# Patient Record
Sex: Female | Born: 1990
Health system: Southern US, Community
[De-identification: ages and names within clinical notes are randomized; demographics above are authoritative.]

## PROBLEM LIST (undated history)

## (undated) ENCOUNTER — Inpatient Hospital Stay (HOSPITAL_COMMUNITY): Payer: Self-pay

## (undated) DIAGNOSIS — J302 Other seasonal allergic rhinitis: Secondary | ICD-10-CM

## (undated) DIAGNOSIS — D649 Anemia, unspecified: Secondary | ICD-10-CM

## (undated) DIAGNOSIS — B999 Unspecified infectious disease: Secondary | ICD-10-CM

## (undated) DIAGNOSIS — O24419 Gestational diabetes mellitus in pregnancy, unspecified control: Secondary | ICD-10-CM

## (undated) HISTORY — DX: Gestational diabetes mellitus in pregnancy, unspecified control: O24.419

## (undated) HISTORY — PX: NO PAST SURGERIES: SHX2092

---
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
## 2008-03-20 ENCOUNTER — Ambulatory Visit: Payer: Self-pay | Admitting: Family Medicine

## 2008-03-20 ENCOUNTER — Encounter (INDEPENDENT_AMBULATORY_CARE_PROVIDER_SITE_OTHER): Payer: Self-pay | Admitting: Family Medicine

## 2008-03-20 LAB — CONVERTED CEMR LAB
Bilirubin Urine: NEGATIVE
Glucose, Urine, Semiquant: NEGATIVE
Protein, U semiquant: NEGATIVE
Specific Gravity, Urine: 1.025
pH: 5.5

## 2008-03-22 ENCOUNTER — Telehealth (INDEPENDENT_AMBULATORY_CARE_PROVIDER_SITE_OTHER): Payer: Self-pay | Admitting: *Deleted

## 2008-03-22 ENCOUNTER — Ambulatory Visit: Payer: Self-pay | Admitting: Family Medicine

## 2008-03-22 LAB — CONVERTED CEMR LAB: GC Probe Amp, Genital: NEGATIVE

## 2008-04-14 ENCOUNTER — Emergency Department (HOSPITAL_COMMUNITY): Admission: EM | Admit: 2008-04-14 | Discharge: 2008-04-14 | Payer: Self-pay | Admitting: Emergency Medicine

## 2008-07-09 ENCOUNTER — Ambulatory Visit: Payer: Self-pay | Admitting: Family Medicine

## 2008-07-09 LAB — CONVERTED CEMR LAB
Bacteria, UA: 3
Bilirubin Urine: NEGATIVE
Glucose, Urine, Semiquant: NEGATIVE
Ketones, urine, test strip: NEGATIVE
Specific Gravity, Urine: 1.02
pH: 8

## 2008-10-01 ENCOUNTER — Encounter: Payer: Self-pay | Admitting: Family Medicine

## 2008-10-01 ENCOUNTER — Ambulatory Visit: Payer: Self-pay | Admitting: Family Medicine

## 2008-10-01 LAB — CONVERTED CEMR LAB: Whiff Test: POSITIVE

## 2008-10-02 ENCOUNTER — Encounter: Payer: Self-pay | Admitting: Family Medicine

## 2008-10-05 ENCOUNTER — Telehealth: Payer: Self-pay | Admitting: *Deleted

## 2008-10-26 ENCOUNTER — Ambulatory Visit: Payer: Self-pay | Admitting: Family Medicine

## 2008-12-28 ENCOUNTER — Ambulatory Visit: Payer: Self-pay | Admitting: Family Medicine

## 2009-02-12 ENCOUNTER — Ambulatory Visit: Payer: Self-pay | Admitting: Family Medicine

## 2009-03-15 ENCOUNTER — Ambulatory Visit: Payer: Self-pay | Admitting: Family Medicine

## 2010-05-13 ENCOUNTER — Encounter: Payer: Self-pay | Admitting: Family Medicine

## 2010-08-12 NOTE — Miscellaneous (Signed)
  Clinical Lists Changes  Problems: Removed problem of FOLLICULITIS (ICD-704.8) Removed problem of MOLLUSCUM CONTAGIOSUM (ICD-078.0) Removed problem of BACK PAIN, THORACIC REGION (ICD-724.1) Removed problem of HEARTBURN (ICD-787.1) Removed problem of NEED PROPH VACCINATION&INOCULAT OTH VIRAL DZ (ICD-V04.89) Removed problem of CONTACT OR EXPOSURE TO OTHER VIRAL DISEASES (ICD-V01.79) Removed problem of DYSURIA (ICD-788.1) Removed problem of CHLAMYDTRACHOMATIS INFECTION LOWER GU SITES (ICD-099.53) Removed problem of VAGINAL DISCHARGE (ICD-623.5) Removed problem of URINARY FREQUENCY (ICD-788.41) Removed problem of WELL CHILD EXAMINATION (ICD-V20.2)

## 2010-08-20 ENCOUNTER — Encounter: Payer: Self-pay | Admitting: *Deleted

## 2011-04-14 LAB — POCT RAPID STREP A: Streptococcus, Group A Screen (Direct): NEGATIVE

## 2011-04-30 ENCOUNTER — Inpatient Hospital Stay (INDEPENDENT_AMBULATORY_CARE_PROVIDER_SITE_OTHER)
Admission: RE | Admit: 2011-04-30 | Discharge: 2011-04-30 | Disposition: A | Discharge status: Home / Self Care | Payer: Self-pay | Source: Ambulatory Visit | Attending: Emergency Medicine | Admitting: Emergency Medicine

## 2011-04-30 DIAGNOSIS — J069 Acute upper respiratory infection, unspecified: Secondary | ICD-10-CM

## 2011-04-30 DIAGNOSIS — J3489 Other specified disorders of nose and nasal sinuses: Secondary | ICD-10-CM

## 2011-04-30 DIAGNOSIS — M549 Dorsalgia, unspecified: Secondary | ICD-10-CM

## 2011-08-13 ENCOUNTER — Encounter (HOSPITAL_COMMUNITY): Payer: Self-pay

## 2011-08-13 ENCOUNTER — Emergency Department (INDEPENDENT_AMBULATORY_CARE_PROVIDER_SITE_OTHER)
Admission: EM | Admit: 2011-08-13 | Discharge: 2011-08-13 | Disposition: A | Discharge status: Home / Self Care | Payer: Self-pay | Source: Home / Self Care | Attending: Family Medicine | Admitting: Family Medicine

## 2011-08-13 DIAGNOSIS — H6690 Otitis media, unspecified, unspecified ear: Secondary | ICD-10-CM

## 2011-08-13 DIAGNOSIS — H6691 Otitis media, unspecified, right ear: Secondary | ICD-10-CM

## 2011-08-13 DIAGNOSIS — H60399 Other infective otitis externa, unspecified ear: Secondary | ICD-10-CM

## 2011-08-13 DIAGNOSIS — H6090 Unspecified otitis externa, unspecified ear: Secondary | ICD-10-CM

## 2011-08-13 HISTORY — DX: Other seasonal allergic rhinitis: J30.2

## 2011-08-13 MED ORDER — NEOMYCIN-POLYMYXIN-HC 3.5-10000-1 OT SUSP: 4.0000 [drp] | Freq: Three times a day (TID) | OTIC | Status: AC

## 2011-08-13 MED ORDER — AMOXICILLIN 500 MG PO CAPS: 500.0000 mg | ORAL_CAPSULE | Freq: Three times a day (TID) | ORAL | Status: AC

## 2011-08-13 NOTE — ED Provider Notes (Signed)
History     CSN: [id number]  Arrival date & time 08/13/11  9147   First MD Initiated Contact with Patient 08/13/11 [phone]      Chief Complaint  Patient presents with  . Otalgia    (Consider location/radiation/quality/duration/timing/severity/associated sxs/prior treatment) HPI Comments: Right ear pain. Onset 1.5 wks. Pain with motion. No discharge. No cold symptoms. No fever. Reports hearing slightly decreased. No treatment pta.   The history is provided by the patient.    Past Medical History  Diagnosis Date  . Seasonal allergies     History reviewed. No pertinent past surgical history.  History reviewed. No pertinent family history.  History  Substance Use Topics  . Smoking status: Never Smoker   . Smokeless tobacco: Not on file  . Alcohol Use: No    OB History    Grav Para Term Preterm Abortions TAB SAB Ect Mult Living                  Review of Systems  Constitutional: Negative.   HENT: Positive for hearing loss and ear pain. Negative for congestion, rhinorrhea, neck pain, neck stiffness and ear discharge.   Respiratory: Negative.   Cardiovascular: Negative.   Gastrointestinal: Negative.   Musculoskeletal: Negative.   Skin: Negative.     Allergies  Review of patient's allergies indicates no known allergies.  Home Medications   Current Outpatient Rx  Name Route Sig Dispense Refill  . AMOXICILLIN 500 MG PO CAPS Oral Take 1 capsule (500 mg total) by mouth 3 (three) times daily. 30 capsule 0  . FAMOTIDINE 40 MG PO TABS Oral Take 40 mg by mouth daily. For acid refux, take daily for 2 weeks then daily as needed     . MEDROXYPROGESTERONE ACETATE 400 MG/ML IM SUSP  NO INSTRUCTIONS GIVEN     . NEOMYCIN-POLYMYXIN-HC 3.5-10000-1 OT SUSP Right Ear Place 4 drops into the right ear 3 (three) times daily. 7.5 mL 0    BP 104/67  Pulse 83  Temp(Src) 98.8 F (37.1 C) (Oral)  Resp 18  SpO2 99%  Physical Exam  Nursing note and vitals reviewed. Constitutional:  She appears well-developed. No distress.  HENT:       Left tm clear, right tm red, pain with motion of the external ear. Right eac swollen and red  Neck: Normal range of motion. Neck supple.  Cardiovascular: Normal rate and regular rhythm.   Pulmonary/Chest: Effort normal and breath sounds normal.  Lymphadenopathy:    She has no cervical adenopathy.  Skin: Skin is warm and dry.    ED Course  Procedures (including critical care time)   Labs Reviewed  POCT PREGNANCY, URINE   No results found.   1. Otitis externa   2. Otitis media of right ear       MDM          Randa Spike, MD 08/13/11 1006

## 2011-08-13 NOTE — ED Notes (Signed)
C/o rt ear pain for 1 1/2 weeks, states ear feels stopped up as well

## 2011-08-21 ENCOUNTER — Ambulatory Visit (INDEPENDENT_AMBULATORY_CARE_PROVIDER_SITE_OTHER): Payer: Self-pay | Admitting: Family Medicine

## 2011-08-21 ENCOUNTER — Other Ambulatory Visit (HOSPITAL_COMMUNITY)
Admission: RE | Admit: 2011-08-21 | Discharge: 2011-08-21 | Disposition: A | Discharge status: Home / Self Care | Payer: Self-pay | Source: Ambulatory Visit | Attending: Family Medicine | Admitting: Family Medicine

## 2011-08-21 ENCOUNTER — Other Ambulatory Visit (HOSPITAL_COMMUNITY)
Admission: RE | Admit: 2011-08-21 | Discharge: 2011-08-21 | Disposition: A | Discharge status: Home / Self Care | Payer: Medicaid Other | Source: Ambulatory Visit | Attending: Family Medicine | Admitting: Family Medicine

## 2011-08-21 DIAGNOSIS — N760 Acute vaginitis: Secondary | ICD-10-CM

## 2011-08-21 DIAGNOSIS — Z113 Encounter for screening for infections with a predominantly sexual mode of transmission: Secondary | ICD-10-CM | POA: Insufficient documentation

## 2011-08-21 DIAGNOSIS — Z01419 Encounter for gynecological examination (general) (routine) without abnormal findings: Secondary | ICD-10-CM | POA: Insufficient documentation

## 2011-08-21 DIAGNOSIS — Z124 Encounter for screening for malignant neoplasm of cervix: Secondary | ICD-10-CM

## 2011-08-21 DIAGNOSIS — N912 Amenorrhea, unspecified: Secondary | ICD-10-CM

## 2011-08-21 DIAGNOSIS — N898 Other specified noninflammatory disorders of vagina: Secondary | ICD-10-CM

## 2011-08-21 LAB — POCT WET PREP (WET MOUNT)

## 2011-08-21 MED ORDER — FLUCONAZOLE 200 MG PO TABS: 200.0000 mg | ORAL_TABLET | Freq: Once | ORAL | Status: AC

## 2011-08-21 MED ORDER — METRONIDAZOLE 500 MG PO TABS: 500.0000 mg | ORAL_TABLET | Freq: Two times a day (BID) | ORAL | Status: AC

## 2011-08-21 NOTE — Progress Notes (Signed)
  Subjective:    Patient ID: Deborah Mahoney, female    DOB: 1990/08/15, 21 y.o.   MRN: [id number]  HPI 21 year old female with a history of STDs as well as bacterial vaginosis and recurring UTIs coming in with vaginal discharge. Patient states she has had that for about one week clear to white thin discharge not very solid. No clots no bleeding. Patient last period was in December had only spotting this month. We have a urine pregnancy pending. Patient is sexually active no protection but in a monogamous relationship. Denies fever chills or abdominal pain no history of pelvic inflammatory disease.   Review of Systems As stated above    Objective:   Physical Exam  Vitals reviewed. Constitutional: She appears well-developed and well-nourished.  Cardiovascular: Normal rate and regular rhythm.   Pulmonary/Chest: Effort normal and breath sounds normal.  Genitourinary: Vaginal discharge found.       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 No bleeding from OS. Non-tender on bimanual exam. Pap collected.      Assessment & Plan:

## 2011-08-21 NOTE — Patient Instructions (Signed)
Very nice to meet you. I'm going to give you to different medications. First one will be Flagyl. Take one pill twice a day for 5 days. The other one is Diflucan take one pill on the first day. I will give you another pill if still having discharge repeat in 4 days. I will try to call you when I get the results of the other tests. Tried taking cranberry and lactobacillus in pill form daily Consider getting an IUD placed if you think these infections are occurring around her periods.  Bacterial Vaginosis Bacterial vaginosis (BV) is a vaginal infection where the normal balance of bacteria in the vagina is disrupted. The normal balance is then replaced by an overgrowth of certain bacteria. There are several different kinds of bacteria that can cause BV. BV is the most common vaginal infection in women of childbearing age. CAUSES   The cause of BV is not fully understood. BV develops when there is an increase or imbalance of harmful bacteria.   Some activities or behaviors can upset the normal balance of bacteria in the vagina and put women at increased risk including:   Having a new sex partner or multiple sex partners.   Douching.   Using an intrauterine device (IUD) for contraception.   It is not clear what role sexual activity plays in the development of BV. However, women that have never had sexual intercourse are rarely infected with BV.  Women do not get BV from toilet seats, bedding, swimming pools or from touching objects around them.  SYMPTOMS   Grey vaginal discharge.   A fish-like odor with discharge, especially after sexual intercourse.   Itching or burning of the vagina and vulva.   Burning or pain with urination.   Some women have no signs or symptoms at all.  DIAGNOSIS  Your caregiver must examine the vagina for signs of BV. Your caregiver will perform lab tests and look at the sample of vaginal fluid through a microscope. They will look for bacteria and abnormal cells  (clue cells), a pH test higher than 4.5, and a positive amine test all associated with BV.  RISKS AND COMPLICATIONS   Pelvic inflammatory disease (PID).   Infections following gynecology surgery.   Developing HIV.   Developing herpes virus.  TREATMENT  Sometimes BV will clear up without treatment. However, all women with symptoms of BV should be treated to avoid complications, especially if gynecology surgery is planned. Female partners generally do not need to be treated. However, BV may spread between female sex partners so treatment is helpful in preventing a recurrence of BV.   BV may be treated with antibiotics. The antibiotics come in either pill or vaginal cream forms. Either can be used with nonpregnant or pregnant women, but the recommended dosages differ. These antibiotics are not harmful to the baby.   BV can recur after treatment. If this happens, a second round of antibiotics will often be prescribed.   Treatment is important for pregnant women. If not treated, BV can cause a premature delivery, especially for a pregnant woman who had a premature birth in the past. All pregnant women who have symptoms of BV should be checked and treated.   For chronic reoccurrence of BV, treatment with a type of prescribed gel vaginally twice a week is helpful.  HOME CARE INSTRUCTIONS   Finish all medication as directed by your caregiver.   Do not have sex until treatment is completed.   Tell your sexual partner that  you have a vaginal infection. They should see their caregiver and be treated if they have problems, such as a mild rash or itching.   Practice safe sex. Use condoms. Only have 1 sex partner.  PREVENTION  Basic prevention steps can help reduce the risk of upsetting the natural balance of bacteria in the vagina and developing BV:  Do not have sexual intercourse (be abstinent).   Do not douche.   Use all of the medicine prescribed for treatment of BV, even if the signs and  symptoms go away.   Tell your sex partner if you have BV. That way, they can be treated, if needed, to prevent reoccurrence.  SEEK MEDICAL CARE IF:   Your symptoms are not improving after 3 days of treatment.   You have increased discharge, pain, or fever.  MAKE SURE YOU:   Understand these instructions.   Will watch your condition.   Will get help right away if you are not doing well or get worse.    Levonorgestrel intrauterine device (IUD) What is this medicine? LEVONORGESTREL IUD (LEE voe nor jes trel) is a contraceptive (birth control) device. It is used to prevent pregnancy and to treat heavy bleeding that occurs during your period. It can be used for up to 5 years. This medicine may be used for other purposes; ask your health care provider or pharmacist if you have questions. What should I tell my health care provider before I take this medicine? They need to know if you have any of these conditions: -abnormal Pap smear -cancer of the breast, uterus, or cervix -diabetes -endometritis -genital or pelvic infection now or in the past -have more than one sexual partner or your partner has more than one partner -heart disease -history of an ectopic or tubal pregnancy -immune system problems -IUD in place -liver disease or tumor -problems with blood clots or take blood-thinners -use intravenous drugs -uterus of unusual shape -vaginal bleeding that has not been explained -an unusual or allergic reaction to levonorgestrel, other hormones, silicone, or polyethylene, medicines, foods, dyes, or preservatives -pregnant or trying to get pregnant -breast-feeding How should I use this medicine? This device is placed inside the uterus by a health care professional. Talk to your pediatrician regarding the use of this medicine in children. Special care may be needed. Overdosage: If you think you have taken too much of this medicine contact a poison control center or emergency room at  once. NOTE: This medicine is only for you. Do not share this medicine with others. What if I miss a dose? This does not apply. What may interact with this medicine? Do not take this medicine with any of the following medications: -amprenavir -bosentan -fosamprenavir This medicine may also interact with the following medications: -aprepitant -barbiturate medicines for inducing sleep or treating seizures -bexarotene -griseofulvin -medicines to treat seizures like carbamazepine, ethotoin, felbamate, oxcarbazepine, phenytoin, topiramate -modafinil -pioglitazone -rifabutin -rifampin -rifapentine -some medicines to treat HIV infection like atazanavir, indinavir, lopinavir, nelfinavir, tipranavir, ritonavir -St. John's wort -warfarin This list may not describe all possible interactions. Give your health care provider a list of all the medicines, herbs, non-prescription drugs, or dietary supplements you use. Also tell them if you smoke, drink alcohol, or use illegal drugs. Some items may interact with your medicine. What should I watch for while using this medicine? Visit your doctor or health care professional for regular check ups. See your doctor if you or your partner has sexual contact with others, becomes HIV positive,  or gets a sexual transmitted disease. This product does not protect you against HIV infection (AIDS) or other sexually transmitted diseases. You can check the placement of the IUD yourself by reaching up to the top of your vagina with clean fingers to feel the threads. Do not pull on the threads. It is a good habit to check placement after each menstrual period. Call your doctor right away if you feel more of the IUD than just the threads or if you cannot feel the threads at all. The IUD may come out by itself. You may become pregnant if the device comes out. If you notice that the IUD has come out use a backup birth control method like condoms and call your health care  provider. Using tampons will not change the position of the IUD and are okay to use during your period. What side effects may I notice from receiving this medicine? Side effects that you should report to your doctor or health care professional as soon as possible: -allergic reactions like skin rash, itching or hives, swelling of the face, lips, or tongue -fever, flu-like symptoms -genital sores -high blood pressure -no menstrual period for 6 weeks during use -pain, swelling, warmth in the leg -pelvic pain or tenderness -severe or sudden headache -signs of pregnancy -stomach cramping -sudden shortness of breath -trouble with balance, talking, or walking -unusual vaginal bleeding, discharge -yellowing of the eyes or skin Side effects that usually do not require medical attention (report to your doctor or health care professional if they continue or are bothersome): -acne -breast pain -change in sex drive or performance -changes in weight -cramping, dizziness, or faintness while the device is being inserted -headache -irregular menstrual bleeding within first 3 to 6 months of use -nausea This list may not describe all possible side effects. Call your doctor for medical advice about side effects. You may report side effects to FDA at 1-800-FDA-1088. Where should I keep my medicine? This does not apply. NOTE: This sheet is a summary. It may not cover all possible information. If you have questions about this medicine, talk to your doctor, pharmacist, or health care provider.  2012, [employer]. (07/20/2008 6:39:08 PM)

## 2011-08-21 NOTE — Assessment & Plan Note (Signed)
Yeast seen on wet prep today. Also likely has a bacterial vaginosis. We'll treat with both Flagyl as well as Diflucan. Patient is not pregnant patient was on amoxicillin which likely caused the change in [place].  Patient given handouts and other natural supplements that she can take to avoid this reoccurring. Will call patient with GC Chlamydia results once again.

## 2011-10-28 ENCOUNTER — Encounter: Payer: Self-pay | Admitting: Family Medicine

## 2011-10-28 ENCOUNTER — Ambulatory Visit (INDEPENDENT_AMBULATORY_CARE_PROVIDER_SITE_OTHER): Payer: Self-pay | Admitting: Family Medicine

## 2011-10-28 DIAGNOSIS — J309 Allergic rhinitis, unspecified: Secondary | ICD-10-CM | POA: Insufficient documentation

## 2011-10-28 MED ORDER — FLUTICASONE PROPIONATE 50 MCG/ACT NA SUSP: 2.0000 | Freq: Every day | NASAL | Status: DC

## 2011-10-28 MED ORDER — CETIRIZINE HCL 10 MG PO TABS: 10.0000 mg | ORAL_TABLET | Freq: Every day | ORAL | Status: DC

## 2011-10-28 NOTE — Patient Instructions (Signed)
I have sent meds to pharmacy. You can use nasal saline in addition or eye drops for symptoms.  If you have fevers or facial pain, return to care you may have an infection.  Allergic Rhinitis Allergic rhinitis is when the mucous membranes in the nose respond to allergens. Allergens are particles in the air that cause your body to have an allergic reaction. This causes you to release allergic antibodies. Through a chain of events, these eventually cause you to release histamine into the blood stream (hence the use of antihistamines). Although meant to be protective to the body, it is this release that causes your discomfort, such as frequent sneezing, congestion and an itchy runny nose.  CAUSES  The pollen allergens may come from grasses, trees, and weeds. This is seasonal allergic rhinitis, or "hay fever." Other allergens cause year-round allergic rhinitis (perennial allergic rhinitis) such as house dust mite allergen, pet dander and mold spores.  SYMPTOMS   Nasal stuffiness (congestion).   Runny, itchy nose with sneezing and tearing of the eyes.   There is often an itching of the mouth, eyes and ears.  It cannot be cured, but it can be controlled with medications. DIAGNOSIS  If you are unable to determine the offending allergen, skin or blood testing may find it. TREATMENT   Avoid the allergen.   Medications and allergy shots (immunotherapy) can help.   Hay fever may often be treated with antihistamines in pill or nasal spray forms. Antihistamines block the effects of histamine. There are over-the-counter medicines that may help with nasal congestion and swelling around the eyes. Check with your caregiver before taking or giving this medicine.  If the treatment above does not work, there are many new medications your caregiver can prescribe. Stronger medications may be used if initial measures are ineffective. Desensitizing injections can be used if medications and avoidance fails.  Desensitization is when a patient is given ongoing shots until the body becomes less sensitive to the allergen. Make sure you follow up with your caregiver if problems continue. SEEK MEDICAL CARE IF:   You develop fever (more than 100.5 F (38.1 C).   You develop a cough that does not stop easily (persistent).   You have shortness of breath.   You start wheezing.   Symptoms interfere with normal daily activities.  Document Released: 03/24/2001 Document Revised: 06/18/2011 Document Reviewed: 10/03/2008 [facility] Patient Information 2012 [place], [place].

## 2011-10-29 NOTE — Progress Notes (Signed)
  Subjective:    Patient ID: Deborah Mahoney, female    DOB: [birth year], 21 y.o.   MRN: [id number]  HPI  1. Sinus problems/allergies. 2 weeks ago began with nasal congestion, rhinorrhea, itchy eyes. She has tried allegra-D and benadryl without much improvement except made her sleepy. Allergies have been present in the past but not this bad. Denies fever, facial pain, cough, dyspnea, fatigue, emesis.   Nonsmoker.   Review of Systems See HPI otherwise negative.    Objective:   Physical Exam  Constitutional: She is oriented to person, place, and time. She appears well-developed and well-nourished. No distress.       Nasal congestion.  HENT:  Head: Normocephalic and atraumatic.  Right Ear: External ear normal.  Left Ear: External ear normal.  Mouth/Throat: Oropharynx is clear and moist.       No sinus tenderness.  Eyes:       Watery eyes, mildly injected sclera.  Neck: Normal range of motion. Neck supple.  Cardiovascular: Normal rate, regular rhythm and normal heart sounds.   No murmur heard. Pulmonary/Chest: Effort normal and breath sounds normal. No respiratory distress. She has no rales.  Lymphadenopathy:    She has no cervical adenopathy.  Neurological: She is alert and oriented to person, place, and time. No cranial nerve deficit.  Skin: No rash noted.  Psychiatric: She has a normal mood and affect.       Assessment & Plan:   1. Allergic rhinitis  cetirizine (ZYRTEC) 10 MG tablet, fluticasone (FLONASE) 50 MCG/ACT nasal spray  advised regarding addition of nasal saline and importance of daily medication while symptoms persist during this seasonal exacerbation. Return to care if symptoms not improved or develops fever, facial pain, worsening.

## 2011-12-05 ENCOUNTER — Encounter (HOSPITAL_COMMUNITY): Payer: Self-pay

## 2011-12-05 ENCOUNTER — Emergency Department (HOSPITAL_COMMUNITY)
Admission: EM | Admit: 2011-12-05 | Discharge: 2011-12-05 | Disposition: A | Discharge status: Home / Self Care | Payer: Medicaid Other | Attending: Emergency Medicine | Admitting: Emergency Medicine

## 2011-12-05 DIAGNOSIS — J029 Acute pharyngitis, unspecified: Secondary | ICD-10-CM

## 2011-12-05 MED ORDER — IBUPROFEN 200 MG PO TABS
600.0000 mg | ORAL_TABLET | Freq: Once | ORAL | Status: AC
  Administered 2011-12-05: 600 mg via ORAL
  Filled 2011-12-05: qty 3

## 2011-12-05 MED ORDER — IBUPROFEN 600 MG PO TABS: 600.0000 mg | ORAL_TABLET | Freq: Three times a day (TID) | ORAL | Status: AC | PRN

## 2011-12-05 MED ORDER — OXYCODONE-ACETAMINOPHEN 5-325 MG PO TABS
1.0000 | ORAL_TABLET | Freq: Once | ORAL | Status: AC
  Administered 2011-12-05: 1 via ORAL
  Filled 2011-12-05: qty 1

## 2011-12-05 MED ORDER — HYDROCODONE-ACETAMINOPHEN 5-500 MG PO TABS
1.0000 | ORAL_TABLET | Freq: Four times a day (QID) | ORAL | Status: AC | PRN
Start: 2011-12-05 — End: 2011-12-15

## 2011-12-05 MED ORDER — AZITHROMYCIN 250 MG PO TABS: 250.0000 mg | ORAL_TABLET | Freq: Every day | ORAL | Status: AC

## 2011-12-05 NOTE — ED Notes (Signed)
Sore throat began yesterday with body aches

## 2011-12-05 NOTE — Discharge Instructions (Signed)

## 2011-12-05 NOTE — ED Provider Notes (Signed)
History   This chart was scribed for Deborah Co, MD by Melba Coon. The patient was seen in room STRE2/STRE2 and the patient's care was started at 1:26PM.    CSN: [id number]  Arrival date & time 12/05/11  1236   None     Chief Complaint  Patient presents with  . Sore Throat    (Consider location/radiation/quality/duration/timing/severity/associated sxs/prior treatment) HPI NGUYEN BUTLER is a 21 y.o. female who presents to the Emergency Department complaining of constant, moderate to severe sore throat with an onset yesterday. Pt thinks she has strep throat. Pt rates the pain 7.5/10. Pain when swallowing present. Chills, fever, rhinorrhea and generalized body aches present. No HA, neck pain, rash, back pain, CP, SOB, abd pain, n/v/d, dysuria, or extremity pain, edema, weakness, numbness, or tingling. Seasonal allergies present, but no known allergies to medications. No other pertinent medical symptoms.    Past Medical History  Diagnosis Date  . Seasonal allergies     History reviewed. No pertinent past surgical history.  No family history on file.  History  Substance Use Topics  . Smoking status: Never Smoker   . Smokeless tobacco: Not on file  . Alcohol Use: No    OB History    Grav Para Term Preterm Abortions TAB SAB Ect Mult Living                  Review of Systems 10 Systems reviewed and all are negative for acute change except as noted in the HPI.   Allergies  Review of patient's allergies indicates no known allergies.  Home Medications   Current Outpatient Rx  Name Route Sig Dispense Refill  . CETIRIZINE HCL 10 MG PO TABS Oral Take 1 tablet (10 mg total) by mouth daily. 30 tablet 3    BP 107/63  Pulse 97  Temp(Src) 100 F (37.8 C) (Oral)  Resp 20  SpO2 99%  LMP 11/24/2011  Physical Exam  Nursing note and vitals reviewed. Constitutional: She is oriented to person, place, and time. She appears well-developed and well-nourished. No  distress.  HENT:  Head: Normocephalic and atraumatic.       Throat: posterior oropharyngeal erythema; tonsillar erythema; uvula midline; tolerable excretions present  Eyes: EOM are normal.  Neck: Neck supple. No tracheal deviation present.  Cardiovascular: Normal rate.   Pulmonary/Chest: Effort normal. No respiratory distress.  Musculoskeletal: Normal range of motion.  Neurological: She is alert and oriented to person, place, and time.  Skin: Skin is warm and dry.  Psychiatric: She has a normal mood and affect. Her behavior is normal.    ED Course  Procedures (including critical care time)  DIAGNOSTIC STUDIES: Oxygen Saturation is 99% on room air, normal by my interpretation.    COORDINATION OF CARE:  1:30PM - EDMD will order PO abx for the pt and advises pt on how to manage the sore throat for the remainder of its course. EDMD advises f/u with PCP in 3 days if not improved. Pt ready for d/c   Labs Reviewed - No data to display No results found.   1. Pharyngitis       MDM  Patient with evidence of pharyngitis.  No evidence of peritonsillar abscess.  Home with pain medicine and antibiotics.  Understands to return the emergency department for new or worsening symptoms  I personally performed the services described in this documentation, which was scribed in my presence. The recorded information has been reviewed and considered.  Deborah Co, MD 12/05/11 [phone]

## 2012-01-27 ENCOUNTER — Ambulatory Visit: Payer: Self-pay | Admitting: Family Medicine

## 2012-01-28 ENCOUNTER — Other Ambulatory Visit (HOSPITAL_COMMUNITY)
Admission: RE | Admit: 2012-01-28 | Discharge: 2012-01-28 | Disposition: A | Discharge status: Home / Self Care | Payer: Medicaid Other | Source: Ambulatory Visit | Attending: Family Medicine | Admitting: Family Medicine

## 2012-01-28 ENCOUNTER — Encounter: Payer: Self-pay | Admitting: Family Medicine

## 2012-01-28 ENCOUNTER — Ambulatory Visit (INDEPENDENT_AMBULATORY_CARE_PROVIDER_SITE_OTHER): Payer: Self-pay | Admitting: Family Medicine

## 2012-01-28 VITALS — BP 117/73 | HR 64 | Temp 98.1°F | Ht 66.0 in | Wt 154.0 lb

## 2012-01-28 DIAGNOSIS — Z113 Encounter for screening for infections with a predominantly sexual mode of transmission: Secondary | ICD-10-CM | POA: Insufficient documentation

## 2012-01-28 DIAGNOSIS — N898 Other specified noninflammatory disorders of vagina: Secondary | ICD-10-CM

## 2012-01-28 DIAGNOSIS — N760 Acute vaginitis: Secondary | ICD-10-CM

## 2012-01-28 LAB — POCT WET PREP (WET MOUNT)

## 2012-01-28 MED ORDER — METRONIDAZOLE 500 MG PO TABS: 500.0000 mg | ORAL_TABLET | Freq: Two times a day (BID) | ORAL | Status: AC

## 2012-01-28 NOTE — Progress Notes (Signed)
  Subjective:    Patient ID: Deborah Mahoney, female    DOB: May 10, 1991, 21 y.o.   MRN: [id number]  HPI Vaginal discharge: Patient reports vaginal discharge x2 days. Yellow/white in color. No foul odor. Minimal itching. Minimal erythema of labia. Patient has had unprotected sex with her boyfriend. Does have a history of chlamydia. That was adequately treated. Patient also has history of BV and yeast. Patient agrees to be screened for STDs. No fever. No abdominal pain. No vaginal bleeding. No nausea. No vomiting. No back pain. No urinary symptoms.  Smoking status reviewed.   Review of Systems As per above.    Objective:   Physical Exam  Constitutional: She appears well-developed and well-nourished.  Cardiovascular: Normal rate.   Pulmonary/Chest: Effort normal. No respiratory distress.  Genitourinary: Vagina normal and uterus normal. There is no rash, tenderness, lesion or injury on the right labia. There is no rash, tenderness, lesion or injury on the left labia. Cervix exhibits discharge (minimal yellow). Cervix exhibits no motion tenderness and no friability. Right adnexum displays no mass, no tenderness and no fullness. Left adnexum displays no mass, no tenderness and no fullness. No vaginal discharge found.          Assessment & Plan:

## 2012-01-28 NOTE — Assessment & Plan Note (Signed)
Wet prep + for clue cells will treat with flagyl. GC/Chlam/HIV and RPR pending.  Discussed importance of using condoms with patient.  Pt states understanding.

## 2012-01-30 ENCOUNTER — Encounter: Payer: Self-pay | Admitting: Family Medicine

## 2012-04-08 ENCOUNTER — Emergency Department (HOSPITAL_COMMUNITY)
Admission: EM | Admit: 2012-04-08 | Discharge: 2012-04-08 | Disposition: A | Discharge status: Home / Self Care | Payer: Self-pay | Attending: Emergency Medicine | Admitting: Emergency Medicine

## 2012-04-08 ENCOUNTER — Encounter (HOSPITAL_COMMUNITY): Payer: Self-pay | Admitting: Physical Medicine and Rehabilitation

## 2012-04-08 DIAGNOSIS — B9789 Other viral agents as the cause of diseases classified elsewhere: Secondary | ICD-10-CM | POA: Insufficient documentation

## 2012-04-08 DIAGNOSIS — B349 Viral infection, unspecified: Secondary | ICD-10-CM

## 2012-04-08 NOTE — ED Notes (Signed)
Pt presents to department for evaluation of flu-like symptoms. Onset today. States generalized body aches, fatigue, and headache. Denies fever. She is alert and oriented x4. No signs of distress noted. Ambulatory to triage.

## 2012-04-08 NOTE — ED Notes (Signed)
Pt here for body aches and pains, and chills all over, onset last pm and getting worse now. Pt denies fever or vomiting, runny nose per pt.

## 2012-04-08 NOTE — ED Provider Notes (Signed)
History   This chart was scribed for Deborah Horn, MD by Deborah Mahoney. The patient was seen in room TR05C/TR05C and the patient's care was started at 3:31PM.    CSN: [id number]  Arrival date & time 04/08/12  1233   None     Chief Complaint  Patient presents with  . Generalized Body Aches  . Headache  . Fatigue    (Consider location/radiation/quality/duration/timing/severity/associated sxs/prior treatment) The history is provided by the patient. No language interpreter was used.   Deborah Mahoney is a 21 y.o. female who presents to the Emergency Department complaining of constant, moderate to severe headache with associated fatigue and generalized body aches with an onset today. 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 She works with a bunch of kids so maybe she is around sick individuals. Denies cough, nasal congestion, stiff neck, fever, neck pain, sore throat, rash, back pain, CP, SOB, abd pain, n/v/d, vaginal d/c or bleeding, dysuria, or extremity pain, edema, weakness, numbness, or tingling. No other pertinent medical symptoms.   Past Medical History  Diagnosis Date  . Seasonal allergies     No past surgical history on file.  No family history on file.  History  Substance Use Topics  . Smoking status: Never Smoker   . Smokeless tobacco: Not on file  . Alcohol Use: No    OB History    Grav Para Term Preterm Abortions TAB SAB Ect Mult Living                  Review of Systems  Neurological: Positive for headaches.   10 Systems reviewed and all are negative for acute change except as noted in the HPI.   Allergies  Review of patient's allergies indicates no known allergies.  Home Medications  No current outpatient prescriptions on file.  BP 115/68  Pulse 82  Temp 99.1 F (37.3 C) (Oral)  Resp 16  SpO2 100%  Physical Exam  Nursing note and vitals reviewed. Constitutional: She is oriented to person,  place, and time. She appears well-developed and well-nourished. No distress.  HENT:  Head: Normocephalic and atraumatic.  Right Ear: External ear normal.  Left Ear: External ear normal.  Eyes: EOM are normal.  Neck: Normal range of motion. Neck supple. No tracheal deviation present.  Cardiovascular: Normal rate, regular rhythm and normal heart sounds.   No murmur heard. Pulmonary/Chest: Effort normal and breath sounds normal. No respiratory distress. She has no wheezes.  Abdominal: Soft. There is no tenderness.  Musculoskeletal: Normal range of motion. She exhibits no tenderness.  Neurological: She is alert and oriented to person, place, and time.  Skin: Skin is warm and dry. No rash noted.  Psychiatric: She has a normal mood and affect. Her behavior is normal.    ED Course  Procedures (including critical care time)  COORDINATION OF CARE:  3:35PM - she is suspected for have an early viral syndrome and is advised to rest and drink plenty of fluids. She requests a work excuse and that is it. If she is still feeling bad, Deborah. Moses Mahoney is advised to come back to the ED or see her PCP. She is ready for d/c.   Labs Reviewed - No data to display No results found.   1. Viral syndrome       MDM  I personally performed the services described in this documentation, which was scribed in my presence. The recorded  information has been reviewed and considered. Pt stable in ED with no significant deterioration in condition.Patient / Family / Caregiver informed of clinical course, understand medical decision-making process, and agree with plan.I doubt any other EMC precluding discharge at this time including, but not necessarily limited to the following:SBI       Deborah Horn, MD 04/09/12 [phone]

## 2012-04-08 NOTE — Discharge Instructions (Signed)
Antibiotic Nonuse  Your caregiver felt that the infection or problem was not one that would be helped with an antibiotic. Infections may be caused by viruses or bacteria. Only a caregiver can tell which one of these is the likely cause of an illness. A cold is the most common cause of infection in both adults and children. A cold is a virus. Antibiotic treatment will have no effect on a viral infection. Viruses can lead to many lost days of work caring for sick children and many missed days of school. Children may catch as many as 10 "colds" or "flus" per year during which they can be tearful, cranky, and uncomfortable. The goal of treating a virus is aimed at keeping the ill person comfortable. Antibiotics are medications used to help the body fight bacterial infections. There are relatively few types of bacteria that cause infections but there are hundreds of viruses. While both viruses and bacteria cause infection they are very different types of germs. A viral infection will typically go away by itself within 7 to 10 days. Bacterial infections may spread or get worse without antibiotic treatment. Examples of bacterial infections are:  Sore throats (like strep throat or tonsillitis).   Infection in the lung (pneumonia).   Ear and skin infections.  Examples of viral infections are:  Colds or flus.   Most coughs and bronchitis.   Sore throats not caused by Strep.   Runny noses.  It is often best not to take an antibiotic when a viral infection is the cause of the problem. Antibiotics can kill off the helpful bacteria that we have inside our body and allow harmful bacteria to start growing. Antibiotics can cause side effects such as allergies, nausea, and diarrhea without helping to improve the symptoms of the viral infection. Additionally, repeated uses of antibiotics can cause bacteria inside of our body to become resistant. That resistance can be passed onto harmful bacterial. The next time  you have an infection it may be harder to treat if antibiotics are used when they are not needed. Not treating with antibiotics allows our own immune system to develop and take care of infections more efficiently. Also, antibiotics will work better for [place] when they are prescribed for bacterial infections. Treatments for a child that is ill may include:  Give extra fluids throughout the day to stay hydrated.   Get plenty of rest.   Only give your child over-the-counter or prescription medicines for pain, discomfort, or fever as directed by your caregiver.   The use of a cool mist humidifier may help stuffy noses.   Cold medications if suggested by your caregiver.  Your caregiver may decide to start you on an antibiotic if:  The problem you were seen for today continues for a longer length of time than expected.   You develop a secondary bacterial infection.  SEEK MEDICAL CARE IF:  Fever lasts longer than 5 days.   Symptoms continue to get worse after 5 to 7 days or become severe.   Difficulty in breathing develops.   Signs of dehydration develop (poor drinking, rare urinating, dark colored urine).   Changes in behavior or worsening tiredness (listlessness or lethargy).  Document Released: 09/07/2001 Document Revised: 06/18/2011 Document Reviewed: 03/06/2009 [facility] Patient Information 2012 [place], [place].  Not every illness or injury can be identified during an emergency department visit, thus follow-up with your primary healthcare provider is important. Medical conditions can also worsen, so it is also important to return immediately as  directed below, or if you have other serious concerns develop. RETURN IMMEDIATELY IF you develop new shortness of breath, chest pain, fever, have difficulty moving parts of your body (new weakness, numbness, or incoordination), sudden change in speech, vision, swallowing, or understanding, faint or develop new dizziness, severe headache, become  poorly responsive or have an altered mental status compared to baseline for you, new rash, abdominal pain, or bloody stools,  Return sooner also if you develop new problems for which you have not talked to your caregiver but you feel may be emergency medical conditions, or are unable to be cared for safely at home.

## 2012-07-15 ENCOUNTER — Telehealth: Payer: Self-pay | Admitting: Family Medicine

## 2012-07-15 ENCOUNTER — Encounter: Payer: Self-pay | Admitting: Family Medicine

## 2012-07-15 ENCOUNTER — Ambulatory Visit (INDEPENDENT_AMBULATORY_CARE_PROVIDER_SITE_OTHER): Payer: Self-pay | Admitting: Family Medicine

## 2012-07-15 VITALS — BP 124/75 | HR 71 | Temp 98.5°F | Ht 66.0 in | Wt 150.9 lb

## 2012-07-15 DIAGNOSIS — N926 Irregular menstruation, unspecified: Secondary | ICD-10-CM

## 2012-07-15 DIAGNOSIS — N390 Urinary tract infection, site not specified: Secondary | ICD-10-CM | POA: Insufficient documentation

## 2012-07-15 DIAGNOSIS — R300 Dysuria: Secondary | ICD-10-CM

## 2012-07-15 DIAGNOSIS — N898 Other specified noninflammatory disorders of vagina: Secondary | ICD-10-CM

## 2012-07-15 DIAGNOSIS — N949 Unspecified condition associated with female genital organs and menstrual cycle: Secondary | ICD-10-CM

## 2012-07-15 LAB — POCT WET PREP (WET MOUNT)

## 2012-07-15 LAB — POCT URINE PREGNANCY: Preg Test, Ur: NEGATIVE

## 2012-07-15 LAB — POCT UA - MICROSCOPIC ONLY

## 2012-07-15 MED ORDER — ONDANSETRON 8 MG PO TBDP: 8.0000 mg | ORAL_TABLET | Freq: Three times a day (TID) | ORAL | Status: DC | PRN

## 2012-07-15 MED ORDER — METRONIDAZOLE 500 MG PO TABS: 500.0000 mg | ORAL_TABLET | Freq: Two times a day (BID) | ORAL | Status: AC

## 2012-07-15 MED ORDER — CEPHALEXIN 250 MG PO CAPS: 250.0000 mg | ORAL_CAPSULE | Freq: Four times a day (QID) | ORAL | Status: AC

## 2012-07-15 MED ORDER — CEPHALEXIN 250 MG PO CAPS: 250.0000 mg | ORAL_CAPSULE | Freq: Four times a day (QID) | ORAL | Status: DC

## 2012-07-15 MED ORDER — PHENAZOPYRIDINE HCL 100 MG PO TABS: 100.0000 mg | ORAL_TABLET | Freq: Three times a day (TID) | ORAL | Status: DC | PRN

## 2012-07-15 NOTE — Assessment & Plan Note (Signed)
A: BV on wet prep.  P:  Treat with flagyl

## 2012-07-15 NOTE — Patient Instructions (Addendum)
Deborah Mahoney,  Thank you for coming in today.   You do have a UTI.  For this take keflex every 6 hrs for 5 days. Pyridium as needed for pain.  Zofran as needed for nausea.   Your urine pregnancy test is negative: Use condoms with sex Think about birth control: nuva ring is a good option.  I will call with wet prep results.   Dr. Armen Pickup

## 2012-07-15 NOTE — Telephone Encounter (Signed)
BV on wet prep. Will treat with flagyl x 7 days per orders after keflex.

## 2012-07-18 NOTE — Progress Notes (Signed)
Subjective:     Patient ID: Deborah Mahoney, female   DOB: 07-04-1991, 22 y.o.   MRN: [id number]  HPI 22 yo F presents for same day visit accompanied by her boyfriend to discuss the following:  1. Dysuria: x 2 days associated with urgency, nausea and RLQ pain. She denies vomiting and fever. She has taken uricalm with mild relief of symptoms.   2. Vaginal discharge: x one week. Associated with mild itching. No odor or pain with intercourse. She is sexually active with one partner. She does not use condoms.   Review of Systems As per HPI    Objective:   Physical Exam BP 124/75  Pulse 71  Temp 98.5 F (36.9 C) (Oral)  Ht 5\' 6"  (1.676 m)  Wt 150 lb 14.4 oz (68.448 kg)  BMI 24.36 kg/m2  LMP 06/12/2012 General appearance: alert, cooperative and no distress Back: symmetric, no curvature. ROM normal. No CVA tenderness. Abdomen: soft, non-tender; bowel sounds normal; no masses,  no organomegaly Pelvic: cervix normal in appearance, external genitalia normal, no adnexal masses or tenderness, no cervical motion tenderness, positive findings: vaginal discharge:  white and mucoid, rectovaginal septum normal and uterus normal size, shape, and consistency  Labs:  Urine pregnancy test negative UA: large LE, large RBC, 3+ cocci Wet prep: + whiff, many clue cells      Assessment and Plan:

## 2012-07-18 NOTE — Assessment & Plan Note (Signed)
A: You do have a UTI.  P:  For this take keflex every 6 hrs for 5 days. Pyridium as needed for pain.  Zofran as needed for nausea.  Urine sent for culture.

## 2012-07-19 LAB — URINE CULTURE: Colony Count: >=100000

## 2012-07-21 ENCOUNTER — Telehealth: Payer: Self-pay | Admitting: Family Medicine

## 2012-07-21 NOTE — Telephone Encounter (Signed)
Called patient. Informed her that urine culture was staph resistant to the keflex. She reports that it did take a long time for her to start to feel better but she feels better now and has no dysuria or flank pain.  Since she asymptomatic will not retreat. Recommend retreating if patient becomes symptomatic.  The staph is sensitive to cipro and macrobid.

## 2012-09-19 ENCOUNTER — Encounter (HOSPITAL_BASED_OUTPATIENT_CLINIC_OR_DEPARTMENT_OTHER): Payer: Self-pay

## 2012-09-19 ENCOUNTER — Emergency Department (HOSPITAL_BASED_OUTPATIENT_CLINIC_OR_DEPARTMENT_OTHER): Payer: Self-pay

## 2012-09-19 ENCOUNTER — Emergency Department (HOSPITAL_BASED_OUTPATIENT_CLINIC_OR_DEPARTMENT_OTHER)
Admission: EM | Admit: 2012-09-19 | Discharge: 2012-09-19 | Disposition: A | Discharge status: Home / Self Care | Payer: Self-pay | Attending: Emergency Medicine | Admitting: Emergency Medicine

## 2012-09-19 DIAGNOSIS — R6889 Other general symptoms and signs: Secondary | ICD-10-CM | POA: Insufficient documentation

## 2012-09-19 DIAGNOSIS — R0989 Other specified symptoms and signs involving the circulatory and respiratory systems: Secondary | ICD-10-CM

## 2012-09-19 DIAGNOSIS — K1370 Unspecified lesions of oral mucosa: Secondary | ICD-10-CM | POA: Insufficient documentation

## 2012-09-19 NOTE — ED Provider Notes (Signed)
History     CSN: [id number]  Arrival date & time 09/19/12  1417   First MD Initiated Contact with Patient 09/19/12 1503      Chief Complaint  Patient presents with  . Sore Throat    (Consider location/radiation/quality/duration/timing/severity/associated sxs/prior treatment) HPI Deborah Mahoney is a 22 y.o. female who presents with complaint of possible foreign body in throat. States started 3 days ago, while was eating grilled chicken, onions, rice, corn. States was eating it very fast, and felt like something got stuck in her throat. Pt denies pain, just discomfort. Since then, states has been eating and drinking normally with no pain. No nausea or vomiting. No respiratory difficulties. States discomfort continues. States tried to drink plenty of fluids which did nt help. No other complaints.  Past Medical History  Diagnosis Date  . Seasonal allergies     History reviewed. No pertinent past surgical history.  No family history on file.  History  Substance Use Topics  . Smoking status: Never Smoker   . Smokeless tobacco: Not on file  . Alcohol Use: Yes     Comment: occ    OB History   Grav Para Term Preterm Abortions TAB SAB Ect Mult Living                  Review of Systems  Constitutional: Negative for fever and chills.  HENT: Positive for mouth sores. Negative for drooling, trouble swallowing, neck pain, neck stiffness and voice change.   Respiratory: Negative.   Cardiovascular: Negative.     Allergies  Review of patient's allergies indicates no known allergies.  Home Medications   Current Outpatient Rx  Name  Route  Sig  Dispense  Refill  . ondansetron (ZOFRAN ODT) 8 MG disintegrating tablet   Oral   Take 1 tablet (8 mg total) by mouth every 8 (eight) hours as needed for nausea.   20 tablet   0   . phenazopyridine (PYRIDIUM) 100 MG tablet   Oral   Take 1 tablet (100 mg total) by mouth 3 (three) times daily as needed for pain.   10 tablet   0      BP 107/66  Pulse 60  Temp(Src) 98 F (36.7 C) (Oral)  Resp 18  Ht 5\' 6"  (1.676 m)  Wt 155 lb (70.308 kg)  BMI 25.03 kg/m2  SpO2 100%  LMP 08/13/2012  Physical Exam  Nursing note and vitals reviewed. Constitutional: She appears well-developed and well-nourished. No distress.  HENT:  Head: Normocephalic.  Right Ear: External ear normal.  Left Ear: External ear normal.  Nose: Nose normal.  Mouth/Throat: Oropharynx is clear and moist.  Eyes: Conjunctivae are normal.  Neck: Normal range of motion. Neck supple.  Cardiovascular: Normal rate, regular rhythm and normal heart sounds.   Pulmonary/Chest: Effort normal. No respiratory distress. She has no wheezes. She has no rales.  Lymphadenopathy:    She has no cervical adenopathy.  Neurological: She is alert.  Skin: Skin is warm and dry.    ED Course  Procedures (including critical care time)  Dg Neck Soft Tissue  09/19/2012  *RADIOLOGY REPORT*  Clinical Data: Sore throat, possible free impaction several days ago  NECK SOFT TISSUES - 1+ VIEW  Comparison: None.  Findings: The hypopharyngeal airway is unremarkable.  No prevertebral soft tissue swelling is seen.  The cervical vertebrae are in normal alignment with normal disc spaces.  IMPRESSION: No opaque foreign body.  The airway is unremarkable.   Original  Report Authenticated By: Dwyane Dee, M.D.       1. Foreign body sensation in throat       MDM  Pt with possible foreign body int he throat. No opaque body seen. Airway normal, no problems breathing or swallowing. Suspect either scratch or small food particle stuck in throat. Will refer to GI, follow up as needed.         Lottie Mussel, PA-C 09/20/12 [phone]

## 2012-09-19 NOTE — ED Notes (Signed)
Pt ate chicken, rice and corn very quickly 3 days ago, and feels like there's food stuck in her throat.  She has tried to vomit, but without success.  She has eaten since without difficulty.

## 2012-09-19 NOTE — ED Notes (Signed)
C/o sore throat-feeling that she has food impaction from 4 days ago-pt is handling own sections and NAD

## 2012-09-20 NOTE — ED Provider Notes (Signed)
Medical screening examination/treatment/procedure(s) were performed by non-physician practitioner and as supervising physician I was immediately available for consultation/collaboration.   Suzi Roots, MD 09/20/12 2028

## 2013-02-02 ENCOUNTER — Ambulatory Visit (INDEPENDENT_AMBULATORY_CARE_PROVIDER_SITE_OTHER): Payer: Self-pay | Admitting: Family Medicine

## 2013-02-02 ENCOUNTER — Encounter: Payer: Self-pay | Admitting: Family Medicine

## 2013-02-02 ENCOUNTER — Other Ambulatory Visit (HOSPITAL_COMMUNITY)
Admission: RE | Admit: 2013-02-02 | Discharge: 2013-02-02 | Disposition: A | Discharge status: Home / Self Care | Payer: Self-pay | Source: Ambulatory Visit | Attending: Family Medicine | Admitting: Family Medicine

## 2013-02-02 VITALS — BP 103/65 | HR 71 | Temp 98.1°F | Ht 66.0 in | Wt 156.0 lb

## 2013-02-02 DIAGNOSIS — Z3200 Encounter for pregnancy test, result unknown: Secondary | ICD-10-CM

## 2013-02-02 DIAGNOSIS — N912 Amenorrhea, unspecified: Secondary | ICD-10-CM

## 2013-02-02 DIAGNOSIS — Z9189 Other specified personal risk factors, not elsewhere classified: Secondary | ICD-10-CM

## 2013-02-02 DIAGNOSIS — Z113 Encounter for screening for infections with a predominantly sexual mode of transmission: Secondary | ICD-10-CM | POA: Insufficient documentation

## 2013-02-02 DIAGNOSIS — Z202 Contact with and (suspected) exposure to infections with a predominantly sexual mode of transmission: Secondary | ICD-10-CM

## 2013-02-02 DIAGNOSIS — N898 Other specified noninflammatory disorders of vagina: Secondary | ICD-10-CM

## 2013-02-02 LAB — POCT WET PREP (WET MOUNT)

## 2013-02-02 LAB — POCT URINE PREGNANCY: Preg Test, Ur: NEGATIVE

## 2013-02-02 NOTE — Patient Instructions (Addendum)
Your wet prep is negative and your pregnancy test is negative. Your vaginal discharge may be normal or could be from friction. You can try lubrication ([place] jelly). Try not to use anything else in the vagina and stay away from douching.  Your other tests will take a few days to come back. Call in if you don't hear from me in 1 week, in case I was unable to reach you.  If you develop fevers, abdominal pain, worse pain with sex, or other concerns, come back in sooner.  If you are trying to get pregnant, you can start prenatal vitamins and you can use a home pregnancy test or come in if you think you are pregnant. If after actively trying, you do not get pregnant in a few months, come on in and we can discuss it.

## 2013-02-02 NOTE — Assessment & Plan Note (Signed)
Weight stable since 09/2012. Upreg negative - not actively trying.  - If actively try and not pregnant or if no period in 2-3 months, revisit the issue. - Recommended prenatal vitamin if wants to try to become pregnant. - Can try home pregnancy test if symptoms continue - Stay hydrated, return if symptoms worsen.

## 2013-02-02 NOTE — Progress Notes (Signed)
Patient ID: Deborah Mahoney, female   DOB: 1991-02-11, 22 y.o.   MRN: [id number] Subjective:   CC: Vaginal discharge and desiring pregnancy test  HPI:   1. Concern for STD: Patient has had BV in the past ~1 month ago and only took 1/2 of her flagyl because it made her nauseated. She has vaginal discharge that is milky with mild itching for the past month, restarting after she stopped taking flagyl. She is requesting STD check today. Denies pain with sex, fever, chills, abdominal pain, or other concerns.  2. Amenorrhea - Pt has h/o abnormal periods and was on depoprovera until 2 years ago. Periods became more normal every month until June 1 which she thinks was her LMP. She had some spotting July 1 that lasted 1-2 days. She has been nauseated the past week with some dizziness and lightheadedness. She has not used birth control in 2 years and though not actively trying for pregnancy, she is open to it.  Review of Systems - Per HPI.   SH: - 1 sexual partner - No tobacco, drugs, or alcohol.  PMH: G0. Periods have always been abnormal, never monthly, may miss a month here or there. Within this year, felt like it was starting to occur monthly except since June. Usually lasts 5-6 days.  Objective:  Physical Exam BP 103/65  Pulse 71  Temp(Src) 98.1 F (36.7 C) (Oral)  Ht 5\' 6"  (1.676 m)  Wt 156 lb (70.761 kg)  BMI 25.19 kg/m2  LMP 12/11/2012 GEN: NAD, pleasant PULM: Normal effort HEENT: AT/[place], sclera clear, EOMI GU: Vagina WNL, speculum exam with no discharge and cervix appearing WNL with mild friability at central os but otherwise os closed and WNL, no cervical motion tenderness SKIN: No rash or cyanosis EXTR: Spontaneous full ROM, normal gait NEURO: Normal speech and gait    Assessment:     Deborah Mahoney is a 22 y.o. female with h/o recent BV partially treated, here for vaginal discharge and itching and amenorrhea.    Plan:     # See problem list for problem-specific plans.

## 2013-02-02 NOTE — Assessment & Plan Note (Signed)
Did not take full flagyl dose, but wet prep negative. Afebrile with no cervical motion tenderness. Could be from increasing frequency of sex - GC/Chlamydia also collected per pt preference; states she does not want HIV / RPR today because had recently - Try lubrication; pt reports she does not douche - Return if symptoms worsen.

## 2013-02-08 ENCOUNTER — Encounter: Payer: Self-pay | Admitting: Family Medicine

## 2013-03-08 ENCOUNTER — Encounter (HOSPITAL_COMMUNITY): Payer: Self-pay | Admitting: Emergency Medicine

## 2013-03-08 ENCOUNTER — Emergency Department (HOSPITAL_COMMUNITY)
Admission: EM | Admit: 2013-03-08 | Discharge: 2013-03-08 | Disposition: A | Discharge status: Home / Self Care | Payer: Medicaid Other | Attending: Emergency Medicine | Admitting: Emergency Medicine

## 2013-03-08 DIAGNOSIS — Z3201 Encounter for pregnancy test, result positive: Secondary | ICD-10-CM | POA: Insufficient documentation

## 2013-03-08 DIAGNOSIS — R112 Nausea with vomiting, unspecified: Secondary | ICD-10-CM | POA: Insufficient documentation

## 2013-03-08 DIAGNOSIS — Z8709 Personal history of other diseases of the respiratory system: Secondary | ICD-10-CM | POA: Insufficient documentation

## 2013-03-08 DIAGNOSIS — Z3200 Encounter for pregnancy test, result unknown: Secondary | ICD-10-CM

## 2013-03-08 DIAGNOSIS — R109 Unspecified abdominal pain: Secondary | ICD-10-CM | POA: Insufficient documentation

## 2013-03-08 DIAGNOSIS — Z3490 Encounter for supervision of normal pregnancy, unspecified, unspecified trimester: Secondary | ICD-10-CM

## 2013-03-08 LAB — POCT PREGNANCY, URINE: Preg Test, Ur: POSITIVE — AB

## 2013-03-08 LAB — HCG, QUANTITATIVE, PREGNANCY: hCG, Beta Chain, Quant, S: 13201 m[IU]/mL — ABNORMAL HIGH (ref <5)

## 2013-03-08 MED ORDER — PRENATAL COMPLETE 14-0.4 MG PO TABS: 1.0000 | ORAL_TABLET | Freq: Every day | ORAL | Status: DC

## 2013-03-08 MED ORDER — ONDANSETRON 4 MG PO TBDP: 4.0000 mg | ORAL_TABLET | Freq: Three times a day (TID) | ORAL | Status: DC | PRN

## 2013-03-08 NOTE — ED Provider Notes (Signed)
CSN: [id number]     Arrival date & time 03/08/13  1727 History   First MD Initiated Contact with Patient 03/08/13 1810     Chief Complaint  Patient presents with  . Pregnant   . Abdominal Cramping   (Consider location/radiation/quality/duration/timing/severity/associated sxs/prior Treatment) The history is provided by the patient and medical records.   Pt presents to the ED for possibly pregnancy and lower abdominal cramping.  Pt states LMP was in June, cycles are usually regular. Pt took 2 home pregnancy tests, both positive. No prior pregnancies.  No vaginal bleeding or vaginal discharge.  Has been having some intermittent nausea, vomiting, and breast tenderness.  No nipple d/c.  Pt states she wants definitive pregnancy test to confirm.  Pt not currently on birth control.  Routine GYN exams done by her PCP, last exam in July.  No hx of STDs.  Past Medical History  Diagnosis Date  . Seasonal allergies    History reviewed. No pertinent past surgical history. History reviewed. No pertinent family history. History  Substance Use Topics  . Smoking status: Never Smoker   . Smokeless tobacco: Not on file  . Alcohol Use: Yes     Comment: occ   OB History   Grav Para Term Preterm Abortions TAB SAB Ect Mult Living   1              Review of Systems  Gastrointestinal: Positive for nausea.       Abdominal cramping  All other systems reviewed and are negative.    Allergies  Review of patient's allergies indicates no known allergies.  Home Medications   Current Outpatient Rx  Name  Route  Sig  Dispense  Refill  . ibuprofen (ADVIL,MOTRIN) 200 MG tablet   Oral   Take 400 mg by mouth every 8 (eight) hours as needed for pain.          BP 124/61  Pulse 81  Temp(Src) 98.2 F (36.8 C) (Oral)  Resp 18  SpO2 100%  LMP 12/11/2012  Physical Exam  Nursing note and vitals reviewed. Constitutional: She is oriented to person, place, and time. She appears well-developed and  well-nourished. No distress.  HENT:  Head: Normocephalic and atraumatic.  Eyes: Conjunctivae and EOM are normal.  Neck: Normal range of motion. Neck supple.  Cardiovascular: Normal rate, regular rhythm and normal heart sounds.   Pulmonary/Chest: Effort normal and breath sounds normal. No respiratory distress. She has no wheezes.  Abdominal: Soft. Bowel sounds are normal. There is no tenderness. There is no guarding and no CVA tenderness.  Lower abdominal cramping without focal TTP  Musculoskeletal: Normal range of motion. She exhibits no edema.  Neurological: She is alert and oriented to person, place, and time.  Skin: Skin is warm and dry. She is not diaphoretic.  Psychiatric: She has a normal mood and affect.    ED Course  Procedures (including critical care time) Labs Review Labs Reviewed  HCG, QUANTITATIVE, PREGNANCY   Imaging Review No results found.  MDM   1. Pregnancy   2. Encounter for pregnancy test    Pt is G1P0, confirmed by u-preg and quant.  Pt will be started on prenatal vitamins and given zofran PRN.  [facility] for prenatal care.  Discussed plan with pt, they agreed.  Return precautions advised.  Garlon Hatchet, PA-C 03/08/13 2347

## 2013-03-08 NOTE — ED Notes (Signed)
Pt states that today she took a pregnancy test and found out that she was pregnant.  LMP was in June (missed 2 periods).  Pt states that her cramping is low abd and feels like menstrual cramps.  No bleeding or discharge.

## 2013-03-10 NOTE — ED Provider Notes (Signed)
Medical screening examination/treatment/procedure(s) were performed by non-physician practitioner and as supervising physician I was immediately available for consultation/collaboration.    Christopher J. Pollina, MD 03/10/13 1605 

## 2013-03-30 ENCOUNTER — Encounter: Payer: Self-pay | Admitting: Family Medicine

## 2013-03-30 ENCOUNTER — Other Ambulatory Visit: Payer: Medicaid Other

## 2013-03-30 ENCOUNTER — Ambulatory Visit (INDEPENDENT_AMBULATORY_CARE_PROVIDER_SITE_OTHER): Payer: Medicaid Other | Admitting: Family Medicine

## 2013-03-30 VITALS — BP 114/73 | Temp 98.7°F | Wt 167.6 lb

## 2013-03-30 DIAGNOSIS — Z3492 Encounter for supervision of normal pregnancy, unspecified, second trimester: Secondary | ICD-10-CM

## 2013-03-30 DIAGNOSIS — Z3480 Encounter for supervision of other normal pregnancy, unspecified trimester: Secondary | ICD-10-CM

## 2013-03-30 LAB — POCT URINALYSIS DIP (DEVICE)
Leukocytes, UA: NEGATIVE
Nitrite: NEGATIVE
Protein, ur: NEGATIVE mg/dL
Urobilinogen, UA: 0.2 mg/dL (ref 0.0–1.0)
pH: 7 (ref 5.0–8.0)

## 2013-03-30 NOTE — Patient Instructions (Signed)
Pregnancy - Second Trimester The second trimester of pregnancy (3 to 6 months) is a period of rapid growth for you and your baby. At the end of the sixth month, your baby is about 9 inches long and weighs 1 1/2 pounds. You will begin to feel the baby move between 18 and 20 weeks of the pregnancy. This is called quickening. Weight gain is faster. A clear fluid (colostrum) may leak out of your breasts. You may feel small contractions of the womb (uterus). This is known as false labor or Braxton-Hicks contractions. This is like a practice for labor when the baby is ready to be born. Usually, the problems with morning sickness have usually passed by the end of your first trimester. Some women develop small dark blotches (called cholasma, mask of pregnancy) on their face that usually goes away after the baby is born. Exposure to the sun makes the blotches worse. Acne may also develop in some pregnant women and pregnant women who have acne, may find that it goes away. PRENATAL EXAMS  Blood work may continue to be done during prenatal exams. These tests are done to check on your health and the probable health of your baby. Blood work is used to follow your blood levels (hemoglobin). Anemia (low hemoglobin) is common during pregnancy. Iron and vitamins are given to help prevent this. You will also be checked for diabetes between 24 and 28 weeks of the pregnancy. Some of the previous blood tests may be repeated.  The size of the uterus is measured during each visit. This is to make sure that the baby is continuing to grow properly according to the dates of the pregnancy.  Your blood pressure is checked every prenatal visit. This is to make sure you are not getting toxemia.  Your urine is checked to make sure you do not have an infection, diabetes or protein in the urine.  Your weight is checked often to make sure gains are happening at the suggested rate. This is to ensure that both you and your baby are  growing normally.  Sometimes, an ultrasound is performed to confirm the proper growth and development of the baby. This is a test which bounces harmless sound waves off the baby so your caregiver can more accurately determine due dates. Sometimes, a test is done on the amniotic fluid surrounding the baby. This test is called an amniocentesis. The amniotic fluid is obtained by sticking a needle into the belly (abdomen). This is done to check the chromosomes in instances where there is a concern about possible genetic problems with the baby. It is also sometimes done near the end of pregnancy if an early delivery is required. In this case, it is done to help make sure the baby's lungs are mature enough for the baby to live outside of the womb. CHANGES OCCURING IN THE SECOND TRIMESTER OF PREGNANCY Your body goes through many changes during pregnancy. They vary from person to person. Talk to your caregiver about changes you notice that you are concerned about.  During the second trimester, you will likely have an increase in your appetite. It is normal to have cravings for certain foods. This varies from person to person and pregnancy to pregnancy.  Your lower abdomen will begin to bulge.  You may have to urinate more often because the uterus and baby are pressing on your bladder. It is also common to get more bladder infections during pregnancy. You can help this by drinking lots of fluids   and emptying your bladder before and after intercourse.  You may begin to get stretch marks on your hips, abdomen, and breasts. These are normal changes in the body during pregnancy. There are no exercises or medicines to take that prevent this change.  You may begin to develop swollen and bulging veins (varicose veins) in your legs. Wearing support hose, elevating your feet for 15 minutes, 3 to 4 times a day and limiting salt in your diet helps lessen the problem.  Heartburn may develop as the uterus grows and  pushes up against the stomach. Antacids recommended by your caregiver helps with this problem. Also, eating smaller meals 4 to 5 times a day helps.  Constipation can be treated with a stool softener or adding bulk to your diet. Drinking lots of fluids, and eating vegetables, fruits, and whole grains are helpful.  Exercising is also helpful. If you have been very active up until your pregnancy, most of these activities can be continued during your pregnancy. If you have been less active, it is helpful to start an exercise program such as walking.  Hemorrhoids may develop at the end of the second trimester. Warm sitz baths and hemorrhoid cream recommended by your caregiver helps hemorrhoid problems.  Backaches may develop during this time of your pregnancy. Avoid heavy lifting, wear low heal shoes, and practice good posture to help with backache problems.  Some pregnant women develop tingling and numbness of their hand and fingers because of swelling and tightening of ligaments in the wrist (carpel tunnel syndrome). This goes away after the baby is born.  As your breasts enlarge, you may have to get a bigger bra. Get a comfortable, cotton, support bra. Do not get a nursing bra until the last month of the pregnancy if you will be nursing the baby.  You may get a dark line from your belly button to the pubic area called the linea nigra.  You may develop rosy cheeks because of increase blood flow to the face.  You may develop spider looking lines of the face, neck, arms, and chest. These go away after the baby is born. HOME CARE INSTRUCTIONS   It is extremely important to avoid all smoking, herbs, alcohol, and unprescribed drugs during your pregnancy. These chemicals affect the formation and growth of the baby. Avoid these chemicals throughout the pregnancy to ensure the delivery of a healthy infant.  Most of your home care instructions are the same as suggested for the first trimester of your  pregnancy. Keep your caregiver's appointments. Follow your caregiver's instructions regarding medicine use, exercise, and diet.  During pregnancy, you are providing food for you and your baby. Continue to eat regular, well-balanced meals. Choose foods such as meat, fish, milk and other low fat dairy products, vegetables, fruits, and whole-grain breads and cereals. Your caregiver will tell you of the ideal weight gain.  A physical sexual relationship may be continued up until near the end of pregnancy if there are no other problems. Problems could include early (premature) leaking of amniotic fluid from the membranes, vaginal bleeding, abdominal pain, or other medical or pregnancy problems.  Exercise regularly if there are no restrictions. Check with your caregiver if you are unsure of the safety of some of your exercises. The greatest weight gain will occur in the last 2 trimesters of pregnancy. Exercise will help you:  Control your weight.  Get you in shape for labor and delivery.  Lose weight after you have the baby.  Wear   a good support or jogging bra for breast tenderness during pregnancy. This may help if worn during sleep. Pads or tissues may be used in the bra if you are leaking colostrum.  Do not use hot tubs, steam rooms or saunas throughout the pregnancy.  Wear your seat belt at all times when driving. This protects you and your baby if you are in an accident.  Avoid raw meat, uncooked cheese, cat litter boxes, and soil used by cats. These carry germs that can cause birth defects in the baby.  The second trimester is also a good time to visit your dentist for your dental health if this has not been done yet. Getting your teeth cleaned is okay. Use a soft toothbrush. Brush gently during pregnancy.  It is easier to leak urine during pregnancy. Tightening up and strengthening the pelvic muscles will help with this problem. Practice stopping your urination while you are going to the  bathroom. These are the same muscles you need to strengthen. It is also the muscles you would use as if you were trying to stop from passing gas. You can practice tightening these muscles up 10 times a set and repeating this about 3 times per day. Once you know what muscles to tighten up, do not perform these exercises during urination. It is more likely to contribute to an infection by backing up the urine.  Ask for help if you have financial, counseling, or nutritional needs during pregnancy. Your caregiver will be able to offer counseling for these needs as well as refer you for other special needs.  Your skin may become oily. If so, wash your face with mild soap, use non-greasy moisturizer and oil or cream based makeup. MEDICINES AND DRUG USE IN PREGNANCY  Take prenatal vitamins as directed. The vitamin should contain 1 milligram of folic acid. Keep all vitamins out of reach of children. Only a couple vitamins or tablets containing iron may be fatal to a baby or young child when ingested.  Avoid use of all medicines, including herbs, over-the-counter medicines, not prescribed or suggested by your caregiver. Only take over-the-counter or prescription medicines for pain, discomfort, or fever as directed by your caregiver. Do not use aspirin.  Let your caregiver also know about herbs you may be using.  Alcohol is related to a number of birth defects. This includes fetal alcohol syndrome. All alcohol, in any form, should be avoided completely. Smoking will cause low birth rate and premature babies.  Street or illegal drugs are very harmful to the baby. They are absolutely forbidden. A baby born to an addicted mother will be addicted at birth. The baby will go through the same withdrawal an adult does. SEEK MEDICAL CARE IF:  You have any concerns or worries during your pregnancy. It is better to call with your questions if you feel they cannot wait, rather than worry about them. SEEK IMMEDIATE  MEDICAL CARE IF:   An unexplained oral temperature above 102 F (38.9 C) develops, or as your caregiver suggests.  You have leaking of fluid from the vagina (birth canal). If leaking membranes are suspected, take your temperature and tell your caregiver of this when you call.  There is vaginal spotting, bleeding, or passing clots. Tell your caregiver of the amount and how many pads are used. Light spotting in pregnancy is common, especially following intercourse.  You develop a bad smelling vaginal discharge with a change in the color from clear to white.  You continue to feel   sick to your stomach (nauseated) and have no relief from remedies suggested. You vomit blood or coffee ground-like materials.  You lose more than 2 pounds of weight or gain more than 2 pounds of weight over 1 week, or as suggested by your caregiver.  You notice swelling of your face, hands, feet, or legs.  You get exposed to German measles and have never had them.  You are exposed to fifth disease or chickenpox.  You develop belly (abdominal) pain. Round ligament discomfort is a common non-cancerous (benign) cause of abdominal pain in pregnancy. Your caregiver still must evaluate you.  You develop a bad headache that does not go away.  You develop fever, diarrhea, pain with urination, or shortness of breath.  You develop visual problems, blurry, or double vision.  You fall or are in a car accident or any kind of trauma.  There is mental or physical violence at home. Document Released: 06/23/2001 Document Revised: 03/23/2012 Document Reviewed: 12/26/2008 [employer] Patient Information 2014 [employer].  

## 2013-03-30 NOTE — Addendum Note (Signed)
Addended by: Toula Moos on: 03/30/2013 03:12 PM   Modules accepted: Orders

## 2013-03-30 NOTE — Progress Notes (Signed)
Subjective:    Deborah Mahoney is a G1P0 [redacted]w[redacted]d being seen today for her first obstetrical visit.  Her obstetrical history is significant for new mother. Patient does intend to breast feed. Pregnancy history fully reviewed.  Patient reports no bleeding, no contractions, no cramping and no leaking.  Filed Vitals:   03/30/13 1438  BP: 114/73  Temp: 98.7 F (37.1 C)  Weight: 76.023 kg (167 lb 9.6 oz)    HISTORY: OB History  Gravida Para Term Preterm AB SAB TAB Ectopic Multiple Living  1             # Outcome Date GA Lbr Len/2nd Weight Sex Delivery Anes PTL Lv  1 CUR              Past Medical History  Diagnosis Date  . Seasonal allergies    History reviewed. No pertinent past surgical history. Family History  Problem Relation Age of Onset  . Hypertension Mother   . Hyperlipidemia Father      Exam    Uterus:     Pelvic Exam:    Perineum: No Hemorrhoids   Vulva: normal   Vagina:  normal mucosa   pH:    Cervix: no cervical motion tenderness and no lesions   Adnexa: normal adnexa   Bony Pelvis:   System:      Skin: normal coloration and turgor, no rashes    Neurologic: oriented, normal, negative, normal mood, gait normal; reflexes normal and symmetric   Extremities: normal strength, tone, and muscle mass   HEENT extra ocular movement intact, sclera clear, anicteric and neck supple with midline trachea   Mouth/Teeth mucous membranes moist, pharynx normal without lesions   Neck supple and no masses   Cardiovascular: regular rate and rhythm, no murmurs or gallops   Respiratory:  appears well, vitals normal, no respiratory distress, acyanotic, normal RR, ear and throat exam is normal, neck free of mass or lymphadenopathy, chest clear, no wheezing, crepitations, rhonchi, normal symmetric air entry   Abdomen: soft, non-tender; bowel sounds normal; no masses,  no organomegaly   Urinary: urethral meatus normal      Assessment:    Pregnancy: G1P0 Patient Active  Problem List   Diagnosis Date Noted  . Encounter for pregnancy test 02/02/2013  . UTI (lower urinary tract infection) 07/15/2012  . Vaginal discharge 01/28/2012  . Allergic rhinitis 10/28/2011        Plan:   Deborah Mahoney is a 22 y.o. G1P0 at [redacted]w[redacted]d by LMP? presents for ROB  Discussed with Patient:  - Patient plans on breast/ bottle feeding. - Routine precautions discussed (depression, infection s/s).   Patient provided with all pertinent phone numbers for emergencies. - RTC for any VB, regular, painful cramps/ctxs occurring at a rate of >2/10 min, fever (100.5 or higher), n/v/d, any pain that is unresolving or worsening. - RTC in 4 weeks for next appt.  Problems: Patient Active Problem List   Diagnosis Date Noted  . Encounter for pregnancy test 02/02/2013  . UTI (lower urinary tract infection) 07/15/2012  . Vaginal discharge 01/28/2012  . Allergic rhinitis 10/28/2011    To Do: 1. 18 week anatomy scan ordered.  Patient to schedule.  [ ]  Vaccines: Flu:  Tdap:  [ ]  BCM:   Edu: [x ] PTL precautions; [ ]  BF class; [ ]  childbirth class; [ ]   BF counseling    Initial labs drawn. Prenatal vitamins. Problem list reviewed and updated. Genetic Screening discussed Quad Screen:  ordered.  Ultrasound discussed; fetal survey: requested.  Follow up in 4 weeks.  Deborah Mahoney 03/30/2013

## 2013-03-30 NOTE — Progress Notes (Signed)
P=73, Here for first ob visit. States not sure of date of lmp- but thinks it was around June 1. C/o brown discharge sometimes. Given new patient information, discussed bmi/ appropriate weight  Gain.  C/o cramps sometimes.

## 2013-03-31 LAB — OBSTETRIC PANEL
Basophils Relative: 0 % (ref 0–1)
Hemoglobin: 11.9 g/dL — ABNORMAL LOW (ref 12.0–15.0)
Hepatitis B Surface Ag: NEGATIVE
MCHC: 35.1 g/dL (ref 30.0–36.0)
Monocytes Relative: 8 % (ref 3–12)
Neutro Abs: 6.6 10*3/uL (ref 1.7–7.7)
Neutrophils Relative %: 73 % (ref 43–77)
RBC: 3.85 MIL/uL — ABNORMAL LOW (ref 3.87–5.11)
Rh Type: POSITIVE
WBC: 8.9 10*3/uL (ref 4.0–10.5)

## 2013-03-31 LAB — CULTURE, OB URINE: Organism ID, Bacteria: NO GROWTH

## 2013-04-03 LAB — HEMOGLOBINOPATHY EVALUATION
Hgb A: 97.3 % (ref 96.8–97.8)
Hgb F Quant: 0 % (ref 0.0–2.0)
Hgb S Quant: 0 %

## 2013-04-06 ENCOUNTER — Ambulatory Visit: Payer: Self-pay | Admitting: Family Medicine

## 2013-04-10 ENCOUNTER — Encounter: Payer: Self-pay | Admitting: Family Medicine

## 2013-04-11 ENCOUNTER — Ambulatory Visit: Payer: Self-pay | Admitting: Family Medicine

## 2013-04-12 ENCOUNTER — Encounter: Payer: Self-pay | Admitting: Family Medicine

## 2013-04-27 ENCOUNTER — Ambulatory Visit (INDEPENDENT_AMBULATORY_CARE_PROVIDER_SITE_OTHER): Payer: Medicaid Other | Admitting: Family Medicine

## 2013-04-27 ENCOUNTER — Encounter: Payer: Self-pay | Admitting: Family Medicine

## 2013-04-27 VITALS — BP 112/70 | Temp 98.2°F | Wt 170.8 lb

## 2013-04-27 DIAGNOSIS — Z3402 Encounter for supervision of normal first pregnancy, second trimester: Secondary | ICD-10-CM | POA: Insufficient documentation

## 2013-04-27 DIAGNOSIS — Z3480 Encounter for supervision of other normal pregnancy, unspecified trimester: Secondary | ICD-10-CM | POA: Insufficient documentation

## 2013-04-27 DIAGNOSIS — Z3400 Encounter for supervision of normal first pregnancy, unspecified trimester: Secondary | ICD-10-CM

## 2013-04-27 LAB — POCT URINALYSIS DIP (DEVICE)
Leukocytes, UA: NEGATIVE
Protein, ur: NEGATIVE mg/dL
Urobilinogen, UA: 0.2 mg/dL (ref 0.0–1.0)

## 2013-04-27 NOTE — Progress Notes (Signed)
22 yo G1 @[redacted]w[redacted]d  here for PNCV - wanting [place] and was told she could get it today - no ctx, vb, lof. No FM yet  O: see flowsheet  A/P:  - low risk OB - US scheduled for next week - discussed quad screen and ordered today.   - f/u here in 4 weeks - discussed VB in first trimester

## 2013-04-27 NOTE — Patient Instructions (Signed)
Pregnancy - Second Trimester The second trimester of pregnancy (3 to 6 months) is a period of rapid growth for you and your baby. At the end of the sixth month, your baby is about 9 inches long and weighs 1 1/2 pounds. You will begin to feel the baby move between 18 and 20 weeks of the pregnancy. This is called quickening. Weight gain is faster. A clear fluid (colostrum) may leak out of your breasts. You may feel small contractions of the womb (uterus). This is known as false labor or Braxton-Hicks contractions. This is like a practice for labor when the baby is ready to be born. Usually, the problems with morning sickness have usually passed by the end of your first trimester. Some women develop small dark blotches (called cholasma, mask of pregnancy) on their face that usually goes away after the baby is born. Exposure to the sun makes the blotches worse. Acne may also develop in some pregnant women and pregnant women who have acne, may find that it goes away. PRENATAL EXAMS  Blood work may continue to be done during prenatal exams. These tests are done to check on your health and the probable health of your baby. Blood work is used to follow your blood levels (hemoglobin). Anemia (low hemoglobin) is common during pregnancy. Iron and vitamins are given to help prevent this. You will also be checked for diabetes between 24 and 28 weeks of the pregnancy. Some of the previous blood tests may be repeated.  The size of the uterus is measured during each visit. This is to make sure that the baby is continuing to grow properly according to the dates of the pregnancy.  Your blood pressure is checked every prenatal visit. This is to make sure you are not getting toxemia.  Your urine is checked to make sure you do not have an infection, diabetes or protein in the urine.  Your weight is checked often to make sure gains are happening at the suggested rate. This is to ensure that both you and your baby are  growing normally.  Sometimes, an ultrasound is performed to confirm the proper growth and development of the baby. This is a test which bounces harmless sound waves off the baby so your caregiver can more accurately determine due dates. Sometimes, a test is done on the amniotic fluid surrounding the baby. This test is called an amniocentesis. The amniotic fluid is obtained by sticking a needle into the belly (abdomen). This is done to check the chromosomes in instances where there is a concern about possible genetic problems with the baby. It is also sometimes done near the end of pregnancy if an early delivery is required. In this case, it is done to help make sure the baby's lungs are mature enough for the baby to live outside of the womb. CHANGES OCCURING IN THE SECOND TRIMESTER OF PREGNANCY Your body goes through many changes during pregnancy. They vary from person to person. Talk to your caregiver about changes you notice that you are concerned about.  During the second trimester, you will likely have an increase in your appetite. It is normal to have cravings for certain foods. This varies from person to person and pregnancy to pregnancy.  Your lower abdomen will begin to bulge.  You may have to urinate more often because the uterus and baby are pressing on your bladder. It is also common to get more bladder infections during pregnancy. You can help this by drinking lots of fluids   and emptying your bladder before and after intercourse.  You may begin to get stretch marks on your hips, abdomen, and breasts. These are normal changes in the body during pregnancy. There are no exercises or medicines to take that prevent this change.  You may begin to develop swollen and bulging veins (varicose veins) in your legs. Wearing support hose, elevating your feet for 15 minutes, 3 to 4 times a day and limiting salt in your diet helps lessen the problem.  Heartburn may develop as the uterus grows and  pushes up against the stomach. Antacids recommended by your caregiver helps with this problem. Also, eating smaller meals 4 to 5 times a day helps.  Constipation can be treated with a stool softener or adding bulk to your diet. Drinking lots of fluids, and eating vegetables, fruits, and whole grains are helpful.  Exercising is also helpful. If you have been very active up until your pregnancy, most of these activities can be continued during your pregnancy. If you have been less active, it is helpful to start an exercise program such as walking.  Hemorrhoids may develop at the end of the second trimester. Warm sitz baths and hemorrhoid cream recommended by your caregiver helps hemorrhoid problems.  Backaches may develop during this time of your pregnancy. Avoid heavy lifting, wear low heal shoes, and practice good posture to help with backache problems.  Some pregnant women develop tingling and numbness of their hand and fingers because of swelling and tightening of ligaments in the wrist (carpel tunnel syndrome). This goes away after the baby is born.  As your breasts enlarge, you may have to get a bigger bra. Get a comfortable, cotton, support bra. Do not get a nursing bra until the last month of the pregnancy if you will be nursing the baby.  You may get a dark line from your belly button to the pubic area called the linea nigra.  You may develop rosy cheeks because of increase blood flow to the face.  You may develop spider looking lines of the face, neck, arms, and chest. These go away after the baby is born. HOME CARE INSTRUCTIONS   It is extremely important to avoid all smoking, herbs, alcohol, and unprescribed drugs during your pregnancy. These chemicals affect the formation and growth of the baby. Avoid these chemicals throughout the pregnancy to ensure the delivery of a healthy infant.  Most of your home care instructions are the same as suggested for the first trimester of your  pregnancy. Keep your caregiver's appointments. Follow your caregiver's instructions regarding medicine use, exercise, and diet.  During pregnancy, you are providing food for you and your baby. Continue to eat regular, well-balanced meals. Choose foods such as meat, fish, milk and other low fat dairy products, vegetables, fruits, and whole-grain breads and cereals. Your caregiver will tell you of the ideal weight gain.  A physical sexual relationship may be continued up until near the end of pregnancy if there are no other problems. Problems could include early (premature) leaking of amniotic fluid from the membranes, vaginal bleeding, abdominal pain, or other medical or pregnancy problems.  Exercise regularly if there are no restrictions. Check with your caregiver if you are unsure of the safety of some of your exercises. The greatest weight gain will occur in the last 2 trimesters of pregnancy. Exercise will help you:  Control your weight.  Get you in shape for labor and delivery.  Lose weight after you have the baby.  Wear   a good support or jogging bra for breast tenderness during pregnancy. This may help if worn during sleep. Pads or tissues may be used in the bra if you are leaking colostrum.  Do not use hot tubs, steam rooms or saunas throughout the pregnancy.  Wear your seat belt at all times when driving. This protects you and your baby if you are in an accident.  Avoid raw meat, uncooked cheese, cat litter boxes, and soil used by cats. These carry germs that can cause birth defects in the baby.  The second trimester is also a good time to visit your dentist for your dental health if this has not been done yet. Getting your teeth cleaned is okay. Use a soft toothbrush. Brush gently during pregnancy.  It is easier to leak urine during pregnancy. Tightening up and strengthening the pelvic muscles will help with this problem. Practice stopping your urination while you are going to the  bathroom. These are the same muscles you need to strengthen. It is also the muscles you would use as if you were trying to stop from passing gas. You can practice tightening these muscles up 10 times a set and repeating this about 3 times per day. Once you know what muscles to tighten up, do not perform these exercises during urination. It is more likely to contribute to an infection by backing up the urine.  Ask for help if you have financial, counseling, or nutritional needs during pregnancy. Your caregiver will be able to offer counseling for these needs as well as refer you for other special needs.  Your skin may become oily. If so, wash your face with mild soap, use non-greasy moisturizer and oil or cream based makeup. MEDICINES AND DRUG USE IN PREGNANCY  Take prenatal vitamins as directed. The vitamin should contain 1 milligram of folic acid. Keep all vitamins out of reach of children. Only a couple vitamins or tablets containing iron may be fatal to a baby or young child when ingested.  Avoid use of all medicines, including herbs, over-the-counter medicines, not prescribed or suggested by your caregiver. Only take over-the-counter or prescription medicines for pain, discomfort, or fever as directed by your caregiver. Do not use aspirin.  Let your caregiver also know about herbs you may be using.  Alcohol is related to a number of birth defects. This includes fetal alcohol syndrome. All alcohol, in any form, should be avoided completely. Smoking will cause low birth rate and premature babies.  Street or illegal drugs are very harmful to the baby. They are absolutely forbidden. A baby born to an addicted mother will be addicted at birth. The baby will go through the same withdrawal an adult does. SEEK MEDICAL CARE IF:  You have any concerns or worries during your pregnancy. It is better to call with your questions if you feel they cannot wait, rather than worry about them. SEEK IMMEDIATE  MEDICAL CARE IF:   An unexplained oral temperature above 102 F (38.9 C) develops, or as your caregiver suggests.  You have leaking of fluid from the vagina (birth canal). If leaking membranes are suspected, take your temperature and tell your caregiver of this when you call.  There is vaginal spotting, bleeding, or passing clots. Tell your caregiver of the amount and how many pads are used. Light spotting in pregnancy is common, especially following intercourse.  You develop a bad smelling vaginal discharge with a change in the color from clear to white.  You continue to feel   sick to your stomach (nauseated) and have no relief from remedies suggested. You vomit blood or coffee ground-like materials.  You lose more than 2 pounds of weight or gain more than 2 pounds of weight over 1 week, or as suggested by your caregiver.  You notice swelling of your face, hands, feet, or legs.  You get exposed to German measles and have never had them.  You are exposed to fifth disease or chickenpox.  You develop belly (abdominal) pain. Round ligament discomfort is a common non-cancerous (benign) cause of abdominal pain in pregnancy. Your caregiver still must evaluate you.  You develop a bad headache that does not go away.  You develop fever, diarrhea, pain with urination, or shortness of breath.  You develop visual problems, blurry, or double vision.  You fall or are in a car accident or any kind of trauma.  There is mental or physical violence at home. Document Released: 06/23/2001 Document Revised: 03/23/2012 Document Reviewed: 12/26/2008 [employer] Patient Information 2014 [employer].  

## 2013-04-27 NOTE — Progress Notes (Signed)
P=82 C/o of morning sickness every morning but pt/ states she has not purchased the zofran yet.

## 2013-05-02 ENCOUNTER — Ambulatory Visit (HOSPITAL_COMMUNITY)
Admission: RE | Admit: 2013-05-02 | Discharge: 2013-05-02 | Disposition: A | Discharge status: Home / Self Care | Payer: Medicaid Other | Source: Ambulatory Visit | Attending: Family Medicine | Admitting: Family Medicine

## 2013-05-02 ENCOUNTER — Other Ambulatory Visit: Payer: Self-pay | Admitting: Family Medicine

## 2013-05-02 DIAGNOSIS — Z363 Encounter for antenatal screening for malformations: Secondary | ICD-10-CM | POA: Insufficient documentation

## 2013-05-02 DIAGNOSIS — Z1389 Encounter for screening for other disorder: Secondary | ICD-10-CM | POA: Insufficient documentation

## 2013-05-02 DIAGNOSIS — Z3492 Encounter for supervision of normal pregnancy, unspecified, second trimester: Secondary | ICD-10-CM

## 2013-05-02 DIAGNOSIS — Z3687 Encounter for antenatal screening for uncertain dates: Secondary | ICD-10-CM

## 2013-05-02 LAB — AFP, QUAD SCREEN
AFP: 22.4 IU/mL
Age Alone: 1:1060 {titer}
Down Syndrome Scr Risk Est: 1:6 {titer}
HCG, Total: 77273 m[IU]/mL
INH: 297.2 pg/mL
Interpretation-AFP: POSITIVE — AB
MoM for AFP: 0.51
MoM for INH: 1.63
MoM for hCG: 5.39
Open Spina bifida: NEGATIVE
Tri 18 Scr Risk Est: NEGATIVE
uE3 Mom: 0.09

## 2013-05-03 ENCOUNTER — Telehealth: Payer: Self-pay | Admitting: *Deleted

## 2013-05-03 NOTE — Telephone Encounter (Signed)
Deborah Mahoney called and left a message she has a question- states a little concerned about her pregnancy.

## 2013-05-04 ENCOUNTER — Other Ambulatory Visit: Payer: Self-pay | Admitting: Family Medicine

## 2013-05-04 DIAGNOSIS — O280 Abnormal hematological finding on antenatal screening of mother: Secondary | ICD-10-CM

## 2013-05-04 NOTE — Telephone Encounter (Signed)
Called Deborah Mahoney and she reports that the night before she had got up to urinate during the night and noticed some brownish discharge mixed with a little blood, states she went back to sleep. States the next am had more brownish discharge. She states she did have intercourse the day before . Denies any vaginal bleeding or cramping since then.  States she is still not feeling the baby move yet, but had an ultrasound that day and everything was normal- saw baby moving on ultrasound, etc.  We discussed the discharge may be due to intercourse. We discussed if she had any bright red bleeding or cramps, etc to come to [facility]. We also discussed she should be feeling the baby move so to try to lie down and rest and see if she feels any fluttery type sensations. We reviewed her next ob fu appt. [name] voices understanding.

## 2013-05-05 ENCOUNTER — Telehealth: Payer: Self-pay | Admitting: General Practice

## 2013-05-05 NOTE — Telephone Encounter (Signed)
Called patient and discussed results with her and informed her of MFM appt for 10/30 at 11am. Patient verbalized understanding and had no further questions

## 2013-05-05 NOTE — Telephone Encounter (Signed)
Message copied by Kathee Delton on Fri May 05, 2013 11:10 AM ------      Message from: [place]      Created: Thu May 04, 2013  8:33 PM       Pt had a positive AFP screen and needs an MFM consult. Please will you let her know? Thanks! ------

## 2013-05-09 ENCOUNTER — Telehealth: Payer: Self-pay | Admitting: *Deleted

## 2013-05-09 NOTE — Telephone Encounter (Addendum)
Received call from [facility] @ [facility]FM who stated that the Genetic Counselor had reviewed [place] results for Deborah Mahoney. Since her dating was not correct, the Quad screen was done too early. The pt does not need Genetic counseling and should have Quad re-drawn based on new dating information from [place] on 10/21.  Deborah Mahoney stated that the Genetic counselor will call pt and explain reason for cancelling her appt in their dept. Pt will have Quad drawn at next appt on 05/16/13.

## 2013-05-10 ENCOUNTER — Encounter: Payer: Self-pay | Admitting: *Deleted

## 2013-05-11 ENCOUNTER — Encounter (HOSPITAL_COMMUNITY): Payer: Self-pay

## 2013-05-16 ENCOUNTER — Encounter: Payer: Self-pay | Admitting: Obstetrics and Gynecology

## 2013-05-16 ENCOUNTER — Ambulatory Visit (INDEPENDENT_AMBULATORY_CARE_PROVIDER_SITE_OTHER): Payer: Medicaid Other | Admitting: Obstetrics and Gynecology

## 2013-05-16 VITALS — BP 123/69 | Temp 96.7°F | Wt 174.2 lb

## 2013-05-16 DIAGNOSIS — Z3400 Encounter for supervision of normal first pregnancy, unspecified trimester: Secondary | ICD-10-CM

## 2013-05-16 DIAGNOSIS — Z3402 Encounter for supervision of normal first pregnancy, second trimester: Secondary | ICD-10-CM

## 2013-05-16 LAB — POCT URINALYSIS DIP (DEVICE)
Glucose, UA: NEGATIVE mg/dL
Hgb urine dipstick: NEGATIVE
Leukocytes, UA: NEGATIVE
Nitrite: NEGATIVE
Protein, ur: NEGATIVE mg/dL
Specific Gravity, Urine: 1.02 (ref 1.005–1.030)
Urobilinogen, UA: 0.2 mg/dL (ref 0.0–1.0)

## 2013-05-16 MED ORDER — FLUCONAZOLE 150 MG PO TABS: 150.0000 mg | ORAL_TABLET | Freq: Once | ORAL | Status: DC

## 2013-05-16 NOTE — Progress Notes (Signed)
Pulse- 73 Pt reports white discharge with some itchiness

## 2013-05-16 NOTE — Progress Notes (Signed)
Patient doing well without complaints. Discussed quad screen done to early last visit and results not valid. Patient declined repeat quad screen. Anatomy ultrasound ordered for first week of december

## 2013-05-29 ENCOUNTER — Telehealth: Payer: Self-pay | Admitting: *Deleted

## 2013-05-29 NOTE — Telephone Encounter (Signed)
Pt called and stated that she is having some pain around her belly. She denies any bleeding or discharge. She is [redacted]w[redacted]d. Pain is present at night and causes her to have some difficulty sleeping. Tylenol helps the pain but she stopped taking it because she was scared it would hurt the baby. I reccommended that she take the tylenol as directed on the label and it is considered safe for pregnancy. Pt agrees and will try this. If pain becomes severe or unbearable she will call us back or go to [facility].

## 2013-06-07 ENCOUNTER — Inpatient Hospital Stay (HOSPITAL_COMMUNITY): Payer: Medicaid Other

## 2013-06-07 ENCOUNTER — Encounter (HOSPITAL_COMMUNITY): Payer: Self-pay

## 2013-06-07 ENCOUNTER — Inpatient Hospital Stay (HOSPITAL_COMMUNITY)
Admission: AD | Admit: 2013-06-07 | Discharge: 2013-06-08 | Disposition: A | Discharge status: Home / Self Care | Payer: Medicaid Other | Source: Ambulatory Visit | Attending: Obstetrics & Gynecology | Admitting: Obstetrics & Gynecology

## 2013-06-07 DIAGNOSIS — M5432 Sciatica, left side: Secondary | ICD-10-CM

## 2013-06-07 DIAGNOSIS — R109 Unspecified abdominal pain: Secondary | ICD-10-CM | POA: Insufficient documentation

## 2013-06-07 DIAGNOSIS — M5430 Sciatica, unspecified side: Secondary | ICD-10-CM | POA: Insufficient documentation

## 2013-06-07 DIAGNOSIS — M7918 Myalgia, other site: Secondary | ICD-10-CM

## 2013-06-07 DIAGNOSIS — IMO0001 Reserved for inherently not codable concepts without codable children: Secondary | ICD-10-CM | POA: Insufficient documentation

## 2013-06-07 DIAGNOSIS — O99891 Other specified diseases and conditions complicating pregnancy: Secondary | ICD-10-CM | POA: Insufficient documentation

## 2013-06-07 LAB — URINALYSIS, ROUTINE W REFLEX MICROSCOPIC
Glucose, UA: NEGATIVE mg/dL
Hgb urine dipstick: NEGATIVE
Ketones, ur: 15 mg/dL — AB
Specific Gravity, Urine: >1.03 — ABNORMAL HIGH (ref 1.005–1.030)
Urobilinogen, UA: 0.2 mg/dL (ref 0.0–1.0)
pH: 6 (ref 5.0–8.0)

## 2013-06-07 LAB — URINE MICROSCOPIC-ADD ON

## 2013-06-07 LAB — CBC
HCT: 31.2 % — ABNORMAL LOW (ref 36.0–46.0)
MCHC: 35.3 g/dL (ref 30.0–36.0)
MCV: 88.6 fL (ref 78.0–100.0)
Platelets: 217 10*3/uL (ref 150–400)
RDW: 12.2 % (ref 11.5–15.5)
WBC: 12.3 10*3/uL — ABNORMAL HIGH (ref 4.0–10.5)

## 2013-06-07 LAB — WET PREP, GENITAL: Trich, Wet Prep: NONE SEEN

## 2013-06-07 MED ORDER — IBUPROFEN 600 MG PO TABS
600.0000 mg | ORAL_TABLET | Freq: Once | ORAL | Status: AC
  Administered 2013-06-07: 600 mg via ORAL
  Filled 2013-06-07: qty 1

## 2013-06-07 NOTE — MAU Note (Signed)
Patients states she has been having L lower side pain for last 2 weeks progressively getting worse tonight. States she [place] at the mall and had sharp shooting pains into her L leg. Rates pain an 8/10. Comfortable on stretcher. Denies LOF or bleeding.

## 2013-06-07 NOTE — MAU Provider Note (Signed)
Chief Complaint: Abdominal Pain   First Provider Initiated Contact with Patient 06/07/13 2137     SUBJECTIVE HPI: Deborah Mahoney is a 22 y.o. G1P0 at [redacted]w[redacted]d by LMP who presents with intermittent left abd pain x 2 weeks, 8/10 at worst. New onset pain radiating down left outer leg to knee, worse w/ walking.   Denies fever, chills, LOF, vaginal discharge, vaginal bleeding, urinary complaints or GI complaints.   Past Medical History  Diagnosis Date  . Seasonal allergies    OB History  Gravida Para Term Preterm AB SAB TAB Ectopic Multiple Living  1             # Outcome Date GA Lbr Len/2nd Weight Sex Delivery Anes PTL Lv  1 CUR              History reviewed. No pertinent past surgical history. History   Social History  . Marital Status: Single    Spouse Name: N/A    Number of Children: N/A  . Years of Education: N/A   Occupational History  . Not on file.   Social History Main Topics  . Smoking status: Former Games developer  . Smokeless tobacco: Never Used  . Alcohol Use: Yes     Comment: occ stoppped with pregnancy  . Drug Use: Yes    Special: Marijuana     Comment: stopped when found out pregnant  . Sexual Activity: Yes    Birth Control/ Protection: None   Other Topics Concern  . Not on file   Social History Narrative  . No narrative on file   No current facility-administered medications on file prior to encounter.   Current Outpatient Prescriptions on File Prior to Encounter  Medication Sig Dispense Refill  . Prenatal Vit-Fe Fumarate-FA (PRENATAL COMPLETE) 14-0.4 MG TABS Take 1 tablet by mouth daily.  30 each  2   No Known Allergies  ROS: Pertinent items in HPI  OBJECTIVE Blood pressure 117/65, temperature 98 F (36.7 C), temperature source Oral, resp. rate 18, height 5\' 6"  (1.676 m), weight 78.019 kg (172 lb), last menstrual period 12/11/2012, SpO2 99.00%. GENERAL: Well-developed, well-nourished female in mild distress.  HEENT: Normocephalic HEART: normal  rate RESP: normal effort ABDOMEN: Soft, moderate left lateral abdominal pain 1 inch below the level of umbilicus. Gravid uterus. Size equals dates. Nontender. Positive bowel sounds x4. No CVA tenderness. EXTREMITIES: Nontender, no edema. Normal and equal sensation and range of motion bilaterally.  NEURO: Alert and oriented. Normal gait. SPECULUM EXAM: NEFG, small amount of creamy, white, odorless discharge, no blood noted, cervix clean BIMANUAL: cervix long and closed; uterus 18-week size size, no adnexal tenderness or masses.  LAB RESULTS Results for orders placed during the hospital encounter of 06/07/13 (from the past 24 hour(s))  URINALYSIS, ROUTINE W REFLEX MICROSCOPIC     Status: Abnormal   Collection Time    06/07/13  8:40 PM      Result Value Range   Color, Urine YELLOW  YELLOW   APPearance HAZY (*) CLEAR   Specific Gravity, Urine >1.030 (*) 1.005 - 1.030   pH 6.0  5.0 - 8.0   Glucose, UA NEGATIVE  NEGATIVE mg/dL   Hgb urine dipstick NEGATIVE  NEGATIVE   Bilirubin Urine NEGATIVE  NEGATIVE   Ketones, ur 15 (*) NEGATIVE mg/dL   Protein, ur NEGATIVE  NEGATIVE mg/dL   Urobilinogen, UA 0.2  0.0 - 1.0 mg/dL   Nitrite NEGATIVE  NEGATIVE   Leukocytes, UA TRACE (*) NEGATIVE  URINE  MICROSCOPIC-ADD ON     Status: Abnormal   Collection Time    06/07/13  8:40 PM      Result Value Range   Squamous Epithelial / LPF FEW (*) RARE   WBC, UA 0-2  <3 WBC/hpf   RBC / HPF 0-2  <3 RBC/hpf   Bacteria, UA MANY (*) RARE  CBC     Status: Abnormal   Collection Time    06/07/13  9:55 PM      Result Value Range   WBC 12.3 (*) 4.0 - 10.5 K/uL   RBC 3.52 (*) 3.87 - 5.11 MIL/uL   Hemoglobin 11.0 (*) 12.0 - 15.0 g/dL   HCT 16.1 (*) 09.6 - 04.5 %   MCV 88.6  78.0 - 100.0 fL   MCH 31.3  26.0 - 34.0 pg   MCHC 35.3  30.0 - 36.0 g/dL   RDW 40.9  81.1 - 91.4 %   Platelets 217  150 - 400 K/uL  WET PREP, GENITAL     Status: Abnormal   Collection Time    06/07/13 11:40 PM      Result Value Range    Yeast Wet Prep HPF POC MODERATE (*) NONE SEEN   Trich, Wet Prep NONE SEEN  NONE SEEN   Clue Cells Wet Prep HPF POC FEW (*) NONE SEEN   WBC, Wet Prep HPF POC MODERATE (*) NONE SEEN    IMAGING OB Limited: Cervical length 3.7 cm, vertex presentation, normal left ovary, right ovary not visualized, Placenta anterior, above cervical os.  MAU COURSE Pain resolved with ibuprofen.  ASSESSMENT 1. Musculoskeletal pain   2. Sciatica of left side without back pain    PLAN Discharge home in stable condition. Comfort measures. Second trimester precautions.  Follow-up Information   Follow up with [facility] On 06/14/2013. (As scheduled or as needed if symptoms worsen)    Specialty:  Obstetrics and Gynecology   Contact information:   [address] [place] [address] [phone]      Follow up with [facility] [place] [facility]ERN[facility] ADMISSIONS. (As needed if symptoms worsen)    Contact information:   [address] [id number] [place] [address] [phone]       Medication List         acetaminophen 500 MG tablet  Commonly known as:  TYLENOL  Take 500 mg by mouth every 6 (six) hours as needed (side pain).     ibuprofen 600 MG tablet  Commonly known as:  ADVIL,MOTRIN  Take 1 tablet (600 mg total) by mouth every 6 (six) hours as needed. Do not take after [redacted] weeks gestation.     PRENATAL COMPLETE 14-0.4 MG Tabs  Take 1 tablet by mouth daily.     terconazole 0.4 % vaginal cream  Commonly known as:  TERAZOL 7  Place 1 applicator vaginally at bedtime.       [place], CNM 06/08/2013  12:13 AM

## 2013-06-08 DIAGNOSIS — IMO0001 Reserved for inherently not codable concepts without codable children: Secondary | ICD-10-CM

## 2013-06-08 MED ORDER — TERCONAZOLE 0.4 % VA CREA: 1.0000 | TOPICAL_CREAM | Freq: Every day | VAGINAL | Status: DC

## 2013-06-08 MED ORDER — IBUPROFEN 600 MG PO TABS: 600.0000 mg | ORAL_TABLET | Freq: Four times a day (QID) | ORAL | Status: DC | PRN

## 2013-06-08 NOTE — MAU Provider Note (Signed)

## 2013-06-12 ENCOUNTER — Ambulatory Visit (HOSPITAL_COMMUNITY)
Admission: RE | Admit: 2013-06-12 | Discharge: 2013-06-12 | Disposition: A | Discharge status: Home / Self Care | Payer: Medicaid Other | Source: Ambulatory Visit | Attending: Obstetrics and Gynecology | Admitting: Obstetrics and Gynecology

## 2013-06-12 DIAGNOSIS — Z3402 Encounter for supervision of normal first pregnancy, second trimester: Secondary | ICD-10-CM

## 2013-06-12 DIAGNOSIS — Z3689 Encounter for other specified antenatal screening: Secondary | ICD-10-CM | POA: Insufficient documentation

## 2013-06-12 LAB — GC/CHLAMYDIA PROBE AMP: GC Probe RNA: NEGATIVE

## 2013-06-14 ENCOUNTER — Encounter: Payer: Self-pay | Admitting: Family Medicine

## 2013-06-14 ENCOUNTER — Encounter: Payer: Self-pay | Admitting: Obstetrics and Gynecology

## 2013-06-20 ENCOUNTER — Ambulatory Visit (INDEPENDENT_AMBULATORY_CARE_PROVIDER_SITE_OTHER): Payer: Medicaid Other | Admitting: Advanced Practice Midwife

## 2013-06-20 VITALS — BP 115/69 | Temp 97.2°F | Wt 184.4 lb

## 2013-06-20 DIAGNOSIS — Z3402 Encounter for supervision of normal first pregnancy, second trimester: Secondary | ICD-10-CM

## 2013-06-20 DIAGNOSIS — Z3400 Encounter for supervision of normal first pregnancy, unspecified trimester: Secondary | ICD-10-CM

## 2013-06-20 LAB — POCT URINALYSIS DIP (DEVICE)
Ketones, ur: NEGATIVE mg/dL
Protein, ur: 30 mg/dL — AB
Specific Gravity, Urine: >=1.03 (ref 1.005–1.030)
Urobilinogen, UA: 0.2 mg/dL (ref 0.0–1.0)

## 2013-06-20 NOTE — Progress Notes (Signed)
Pulse- 76 Pt reports having nausea

## 2013-06-20 NOTE — Patient Instructions (Signed)
Second Trimester of Pregnancy The second trimester is from week 13 through week 28, months 4 through 6. The second trimester is often a time when you feel your best. Your body has also adjusted to being pregnant, and you begin to feel better physically. Usually, morning sickness has lessened or quit completely, you may have more energy, and you may have an increase in appetite. The second trimester is also a time when the fetus is growing rapidly. At the end of the sixth month, the fetus is about 9 inches long and weighs about 1 pounds. You will likely begin to feel the baby move (quickening) between 18 and 20 weeks of the pregnancy. BODY CHANGES Your body goes through many changes during pregnancy. The changes vary from woman to woman.   Your weight will continue to increase. You will notice your lower abdomen bulging out.  You may begin to get stretch marks on your hips, abdomen, and breasts.  You may develop headaches that can be relieved by medicines approved by your caregiver.  You may urinate more often because the fetus is pressing on your bladder.  You may develop or continue to have heartburn as a result of your pregnancy.  You may develop constipation because certain hormones are causing the muscles that push waste through your intestines to slow down.  You may develop hemorrhoids or swollen, bulging veins (varicose veins).  You may have back pain because of the weight gain and pregnancy hormones relaxing your joints between the bones in your pelvis and as a result of a shift in weight and the muscles that support your balance.  Your breasts will continue to grow and be tender.  Your gums may bleed and may be sensitive to brushing and flossing.  Dark spots or blotches (chloasma, mask of pregnancy) may develop on your face. This will likely fade after the baby is born.  A dark line from your belly button to the pubic area (linea nigra) may appear. This will likely fade after the  baby is born. WHAT TO EXPECT AT YOUR PRENATAL VISITS During a routine prenatal visit:  You will be weighed to make sure you and the fetus are growing normally.  Your blood pressure will be taken.  Your abdomen will be measured to track your baby's growth.  The fetal heartbeat will be listened to.  Any test results from the previous visit will be discussed. Your caregiver may ask you:  How you are feeling.  If you are feeling the baby move.  If you have had any abnormal symptoms, such as leaking fluid, bleeding, severe headaches, or abdominal cramping.  If you have any questions. Other tests that may be performed during your second trimester include:  Blood tests that check for:  Low iron levels (anemia).  Gestational diabetes (between 24 and 28 weeks).  Rh antibodies.  Urine tests to check for infections, diabetes, or protein in the urine.  An ultrasound to confirm the proper growth and development of the baby.  An amniocentesis to check for possible genetic problems.  Fetal screens for spina bifida and Down syndrome. HOME CARE INSTRUCTIONS   Avoid all smoking, herbs, alcohol, and unprescribed drugs. These chemicals affect the formation and growth of the baby.  Follow your caregiver's instructions regarding medicine use. There are medicines that are either safe or unsafe to take during pregnancy.  Exercise only as directed by your caregiver. Experiencing uterine cramps is a good sign to stop exercising.  Continue to eat regular,   healthy meals.  Wear a good support bra for breast tenderness.  Do not use hot tubs, steam rooms, or saunas.  Wear your seat belt at all times when driving.  Avoid raw meat, uncooked cheese, cat litter boxes, and soil used by cats. These carry germs that can cause birth defects in the baby.  Take your prenatal vitamins.  Try taking a stool softener (if your caregiver approves) if you develop constipation. Eat more high-fiber foods,  such as fresh vegetables or fruit and whole grains. Drink plenty of fluids to keep your urine clear or pale yellow.  Take warm sitz baths to soothe any pain or discomfort caused by hemorrhoids. Use hemorrhoid cream if your caregiver approves.  If you develop varicose veins, wear support hose. Elevate your feet for 15 minutes, 3 4 times a day. Limit salt in your diet.  Avoid heavy lifting, wear low heel shoes, and practice good posture.  Rest with your legs elevated if you have leg cramps or low back pain.  Visit your dentist if you have not gone yet during your pregnancy. Use a soft toothbrush to brush your teeth and be gentle when you floss.  A sexual relationship may be continued unless your caregiver directs you otherwise.  Continue to go to all your prenatal visits as directed by your caregiver. SEEK MEDICAL CARE IF:   You have dizziness.  You have mild pelvic cramps, pelvic pressure, or nagging pain in the abdominal area.  You have persistent nausea, vomiting, or diarrhea.  You have a bad smelling vaginal discharge.  You have pain with urination. SEEK IMMEDIATE MEDICAL CARE IF:   You have a fever.  You are leaking fluid from your vagina.  You have spotting or bleeding from your vagina.  You have severe abdominal cramping or pain.  You have rapid weight gain or loss.  You have shortness of breath with chest pain.  You notice sudden or extreme swelling of your face, hands, ankles, feet, or legs.  You have not felt your baby move in over an hour.  You have severe headaches that do not go away with medicine.  You have vision changes. Document Released: 06/23/2001 Document Revised: 03/01/2013 Document Reviewed: 08/30/2012 ExitCare Patient Information 2014 [employer].  

## 2013-06-20 NOTE — Progress Notes (Signed)
[place] reviewed. Incomplete heart views. Will schedule follow up US.

## 2013-07-11 ENCOUNTER — Ambulatory Visit (HOSPITAL_COMMUNITY)
Admission: RE | Admit: 2013-07-11 | Discharge: 2013-07-11 | Disposition: A | Discharge status: Home / Self Care | Payer: Medicaid Other | Source: Ambulatory Visit | Attending: Advanced Practice Midwife | Admitting: Advanced Practice Midwife

## 2013-07-11 DIAGNOSIS — Z3689 Encounter for other specified antenatal screening: Secondary | ICD-10-CM | POA: Insufficient documentation

## 2013-07-11 DIAGNOSIS — Z3402 Encounter for supervision of normal first pregnancy, second trimester: Secondary | ICD-10-CM

## 2013-07-13 NOTE — L&D Delivery Note (Signed)
Operative Delivery Note At 12:20 AM a viable female was delivered via Vaginal, Spontaneous Delivery.  Presentation: vertex; Position: Right,, Occiput,  Delivery of the head: spontaneous First maneuver: McRoberts (11/04/2013 12:24 AM) Second maneuver: none, Third maneuver: none,    Verbal consent: obtained from patient.  APGAR: 8, 9; weight: 8 lb 2oz Placenta status: Intact, Spontaneous.   Cord: 3 vessel  Anesthesia: Epidural  Episiotomy: none Lacerations: none Suture Repair: none Est. Blood Loss (mL): 400cc  Mom to postpartum.  Baby to Couplet care / Skin to Skin.  Upon arrival patient was complete and pushing. She continued to push with good effort for >30 mins, continued progression, FHT reactive and reassuring with early decels with each contraction. Baby's head delivered without difficulty, assistance called "Code APGAR" with concern for shoulder dystocia, anterior shoulder delivered with assistance of McRobert's Maneuver without difficulty. NICU arrived for initial assessment. Baby delivered and placed on maternal abdomen for oral suctioning, drying and stimulation. Cord was clamped and cut by FOB, baby taken to warmer for NICU assessment, responded well to stimulation and suction. Placenta delivered intact with 3V cord. Vaginal canal and perineum was inspected and intact, did not require sutures. Pitocin was started and uterus massaged until bleeding slowed. Counts of sharps, instruments, and lap pads were all correct.    Saralyn PilarAlexander Karamalegos, DO [facility], PGY-1 11/04/2013, 12:40 AM

## 2013-07-13 NOTE — L&D Delivery Note (Signed)
I have seen and examined this patient and I agree with the above. Deborah MerlesKimberly D [name] 9:22 AM 11/04/2013

## 2013-07-18 ENCOUNTER — Ambulatory Visit (INDEPENDENT_AMBULATORY_CARE_PROVIDER_SITE_OTHER): Payer: No Typology Code available for payment source | Admitting: Advanced Practice Midwife

## 2013-07-18 VITALS — BP 118/71 | Temp 99.1°F | Wt 192.4 lb

## 2013-07-18 DIAGNOSIS — Z3400 Encounter for supervision of normal first pregnancy, unspecified trimester: Secondary | ICD-10-CM

## 2013-07-18 DIAGNOSIS — Z3402 Encounter for supervision of normal first pregnancy, second trimester: Secondary | ICD-10-CM

## 2013-07-18 DIAGNOSIS — Z23 Encounter for immunization: Secondary | ICD-10-CM

## 2013-07-18 MED ORDER — RANITIDINE HCL 150 MG PO TABS: 150.0000 mg | ORAL_TABLET | Freq: Two times a day (BID) | ORAL | Status: DC

## 2013-07-18 NOTE — Progress Notes (Signed)
Doing well.  Good fetal movement, denies vaginal bleeding, LOF, regular contractions.  Having heartburn after certain foods, not daily.  Zantac 150 mg BID or PRN.  Pt unable to urinate during today's visit.  Will leave sample after visit.  Recommend increase in PO fluids.

## 2013-07-18 NOTE — Progress Notes (Signed)
P=87,   C/o still having back pain. Also c/o heartburn.

## 2013-08-01 ENCOUNTER — Ambulatory Visit (INDEPENDENT_AMBULATORY_CARE_PROVIDER_SITE_OTHER): Payer: Medicaid Other | Admitting: Obstetrics and Gynecology

## 2013-08-01 ENCOUNTER — Encounter: Payer: Self-pay | Admitting: Obstetrics and Gynecology

## 2013-08-01 VITALS — BP 108/63 | Temp 98.5°F | Wt 194.3 lb

## 2013-08-01 DIAGNOSIS — Z3402 Encounter for supervision of normal first pregnancy, second trimester: Secondary | ICD-10-CM

## 2013-08-01 DIAGNOSIS — Z3400 Encounter for supervision of normal first pregnancy, unspecified trimester: Secondary | ICD-10-CM

## 2013-08-01 LAB — POCT URINALYSIS DIP (DEVICE)
BILIRUBIN URINE: NEGATIVE
Glucose, UA: NEGATIVE mg/dL
Ketones, ur: NEGATIVE mg/dL
NITRITE: NEGATIVE
PH: 7 (ref 5.0–8.0)
Protein, ur: 30 mg/dL — AB
Specific Gravity, Urine: 1.025 (ref 1.005–1.030)
UROBILINOGEN UA: 0.2 mg/dL (ref 0.0–1.0)

## 2013-08-01 LAB — CBC
HCT: 29.7 % — ABNORMAL LOW (ref 36.0–46.0)
Hemoglobin: 10.2 g/dL — ABNORMAL LOW (ref 12.0–15.0)
MCH: 30.8 pg (ref 26.0–34.0)
MCHC: 34.3 g/dL (ref 30.0–36.0)
MCV: 89.7 fL (ref 78.0–100.0)
PLATELETS: 250 10*3/uL (ref 150–400)
RBC: 3.31 MIL/uL — ABNORMAL LOW (ref 3.87–5.11)
RDW: 12.5 % (ref 11.5–15.5)
WBC: 9.9 10*3/uL (ref 4.0–10.5)

## 2013-08-01 NOTE — Progress Notes (Signed)
Pulse- 85  Edema-feet  Pressure- vaginal and pelvic pressures Pt reports having severe headaches

## 2013-08-01 NOTE — Progress Notes (Signed)
Patient is doing well without any complaints. She reports good fetal movement and occasional headaches for which she has not treated with tylenol.  Advised good oral hydration with water and tylenol as needed. FM/PTL precautions reviewed. 1 hr GCT and labs today

## 2013-08-02 ENCOUNTER — Encounter: Payer: Medicaid Other | Admitting: Obstetrics and Gynecology

## 2013-08-02 ENCOUNTER — Encounter: Payer: Self-pay | Admitting: Obstetrics and Gynecology

## 2013-08-02 LAB — RPR

## 2013-08-02 LAB — HIV ANTIBODY (ROUTINE TESTING W REFLEX): HIV: NONREACTIVE

## 2013-08-02 LAB — GLUCOSE TOLERANCE, 1 HOUR (50G) W/O FASTING: Glucose, 1 Hour GTT: 86 mg/dL (ref 70–140)

## 2013-08-03 LAB — CULTURE, OB URINE: Colony Count: 70000

## 2013-08-15 ENCOUNTER — Telehealth: Payer: Self-pay | Admitting: *Deleted

## 2013-08-15 NOTE — Telephone Encounter (Signed)
Deborah Mahoney called and left a message she is calling about medicine for heartburn- wanting to know if is ok to take. Deborah Mahoney Deborah Mahoney , she is taking generic zantac- ranitidine- informed her that is ok. No other concerns voiced

## 2013-08-29 ENCOUNTER — Encounter: Payer: Medicaid Other | Admitting: Advanced Practice Midwife

## 2013-09-01 ENCOUNTER — Telehealth: Payer: Self-pay | Admitting: *Deleted

## 2013-09-01 NOTE — Telephone Encounter (Signed)
Patient would like a call back to let her know what she can take for a cold.

## 2013-09-04 NOTE — Telephone Encounter (Signed)
Called pt. No answer. Left message stating we are returning your call, please call clinic.  

## 2013-09-06 NOTE — Telephone Encounter (Signed)
Called pt and and spoke with patient and she stated that she got her question answered and she did not have any other questions.

## 2013-09-13 ENCOUNTER — Ambulatory Visit (INDEPENDENT_AMBULATORY_CARE_PROVIDER_SITE_OTHER): Payer: Medicaid Other | Admitting: Advanced Practice Midwife

## 2013-09-13 ENCOUNTER — Encounter: Payer: Self-pay | Admitting: Advanced Practice Midwife

## 2013-09-13 VITALS — BP 119/71 | Temp 98.2°F | Wt 199.9 lb

## 2013-09-13 DIAGNOSIS — Z3402 Encounter for supervision of normal first pregnancy, second trimester: Secondary | ICD-10-CM

## 2013-09-13 DIAGNOSIS — Z3400 Encounter for supervision of normal first pregnancy, unspecified trimester: Secondary | ICD-10-CM

## 2013-09-13 NOTE — Progress Notes (Signed)
Doing well. More pressure upper abdomen at times. Employer asking her when she is going out of work. Pt wants to keep working. I told her it is ok to work through term as long as she remains normal.

## 2013-09-13 NOTE — Patient Instructions (Signed)
Third Trimester of Pregnancy  The third trimester is from week 29 through week 42, months 7 through 9. The third trimester is a time when the fetus is growing rapidly. At the end of the ninth month, the fetus is about 20 inches in length and weighs 6 10 pounds.   BODY CHANGES  Your body goes through many changes during pregnancy. The changes vary from woman to woman.    Your weight will continue to increase. You can expect to gain 25 35 pounds (11 16 kg) by the end of the pregnancy.   You may begin to get stretch marks on your hips, abdomen, and breasts.   You may urinate more often because the fetus is moving lower into your pelvis and pressing on your bladder.   You may develop or continue to have heartburn as a result of your pregnancy.   You may develop constipation because certain hormones are causing the muscles that push waste through your intestines to slow down.   You may develop hemorrhoids or swollen, bulging veins (varicose veins).   You may have pelvic pain because of the weight gain and pregnancy hormones relaxing your joints between the bones in your pelvis. Back aches may result from over exertion of the muscles supporting your posture.   Your breasts will continue to grow and be tender. A yellow discharge may leak from your breasts called colostrum.   Your belly button may stick out.   You may feel short of breath because of your expanding uterus.   You may notice the fetus "dropping," or moving lower in your abdomen.   You may have a bloody mucus discharge. This usually occurs a few days to a week before labor begins.   Your cervix becomes thin and soft (effaced) near your due date.  WHAT TO EXPECT AT YOUR PRENATAL EXAMS   You will have prenatal exams every 2 weeks until week 36. Then, you will have weekly prenatal exams. During a routine prenatal visit:   You will be weighed to make sure you and the fetus are growing normally.   Your blood pressure is taken.   Your abdomen will be  measured to track your baby's growth.   The fetal heartbeat will be listened to.   Any test results from the previous visit will be discussed.   You may have a cervical check near your due date to see if you have effaced.  At around 36 weeks, your caregiver will check your cervix. At the same time, your caregiver will also perform a test on the secretions of the vaginal tissue. This test is to determine if a type of bacteria, Group B streptococcus, is present. Your caregiver will explain this further.  Your caregiver may ask you:   What your birth plan is.   How you are feeling.   If you are feeling the baby move.   If you have had any abnormal symptoms, such as leaking fluid, bleeding, severe headaches, or abdominal cramping.   If you have any questions.  Other tests or screenings that may be performed during your third trimester include:   Blood tests that check for low iron levels (anemia).   Fetal testing to check the health, activity level, and growth of the fetus. Testing is done if you have certain medical conditions or if there are problems during the pregnancy.  FALSE LABOR  You may feel small, irregular contractions that eventually go away. These are called Braxton Hicks contractions, or   false labor. Contractions may last for hours, days, or even weeks before true labor sets in. If contractions come at regular intervals, intensify, or become painful, it is best to be seen by your caregiver.   SIGNS OF LABOR    Menstrual-like cramps.   Contractions that are 5 minutes apart or less.   Contractions that start on the top of the uterus and spread down to the lower abdomen and back.   A sense of increased pelvic pressure or back pain.   A watery or bloody mucus discharge that comes from the vagina.  If you have any of these signs before the 37th week of pregnancy, call your caregiver right away. You need to go to the hospital to get checked immediately.  HOME CARE INSTRUCTIONS    Avoid all  smoking, herbs, alcohol, and unprescribed drugs. These chemicals affect the formation and growth of the baby.   Follow your caregiver's instructions regarding medicine use. There are medicines that are either safe or unsafe to take during pregnancy.   Exercise only as directed by your caregiver. Experiencing uterine cramps is a good sign to stop exercising.   Continue to eat regular, healthy meals.   Wear a good support bra for breast tenderness.   Do not use hot tubs, steam rooms, or saunas.   Wear your seat belt at all times when driving.   Avoid raw meat, uncooked cheese, cat litter boxes, and soil used by cats. These carry germs that can cause birth defects in the baby.   Take your prenatal vitamins.   Try taking a stool softener (if your caregiver approves) if you develop constipation. Eat more high-fiber foods, such as fresh vegetables or fruit and whole grains. Drink plenty of fluids to keep your urine clear or pale yellow.   Take warm sitz baths to soothe any pain or discomfort caused by hemorrhoids. Use hemorrhoid cream if your caregiver approves.   If you develop varicose veins, wear support hose. Elevate your feet for 15 minutes, 3 4 times a day. Limit salt in your diet.   Avoid heavy lifting, wear low heal shoes, and practice good posture.   Rest a lot with your legs elevated if you have leg cramps or low back pain.   Visit your dentist if you have not gone during your pregnancy. Use a soft toothbrush to brush your teeth and be gentle when you floss.   A sexual relationship may be continued unless your caregiver directs you otherwise.   Do not travel far distances unless it is absolutely necessary and only with the approval of your caregiver.   Take prenatal classes to understand, practice, and ask questions about the labor and delivery.   Make a trial run to the hospital.   Pack your hospital bag.   Prepare the baby's nursery.   Continue to go to all your prenatal visits as directed  by your caregiver.  SEEK MEDICAL CARE IF:   You are unsure if you are in labor or if your water has broken.   You have dizziness.   You have mild pelvic cramps, pelvic pressure, or nagging pain in your abdominal area.   You have persistent nausea, vomiting, or diarrhea.   You have a bad smelling vaginal discharge.   You have pain with urination.  SEEK IMMEDIATE MEDICAL CARE IF:    You have a fever.   You are leaking fluid from your vagina.   You have spotting or bleeding from your vagina.     You have severe abdominal cramping or pain.   You have rapid weight loss or gain.   You have shortness of breath with chest pain.   You notice sudden or extreme swelling of your face, hands, ankles, feet, or legs.   You have not felt your baby move in over an hour.   You have severe headaches that do not go away with medicine.   You have vision changes.  Document Released: 06/23/2001 Document Revised: 03/01/2013 Document Reviewed: 08/30/2012  Exit[employer] Patient Information 2014 [employer].

## 2013-09-13 NOTE — Progress Notes (Signed)
Pulse-  95 

## 2013-09-26 ENCOUNTER — Ambulatory Visit (INDEPENDENT_AMBULATORY_CARE_PROVIDER_SITE_OTHER): Payer: Medicaid Other | Admitting: Family Medicine

## 2013-09-26 VITALS — BP 117/68 | Temp 98.2°F | Wt 201.6 lb

## 2013-09-26 DIAGNOSIS — Z23 Encounter for immunization: Secondary | ICD-10-CM

## 2013-09-26 DIAGNOSIS — Z3402 Encounter for supervision of normal first pregnancy, second trimester: Secondary | ICD-10-CM

## 2013-09-26 DIAGNOSIS — Z3400 Encounter for supervision of normal first pregnancy, unspecified trimester: Secondary | ICD-10-CM

## 2013-09-26 DIAGNOSIS — N390 Urinary tract infection, site not specified: Secondary | ICD-10-CM

## 2013-09-26 LAB — POCT URINALYSIS DIP (DEVICE)
Bilirubin Urine: NEGATIVE
Glucose, UA: NEGATIVE mg/dL
HGB URINE DIPSTICK: NEGATIVE
Ketones, ur: NEGATIVE mg/dL
NITRITE: NEGATIVE
PROTEIN: 30 mg/dL — AB
SPECIFIC GRAVITY, URINE: 1.015 (ref 1.005–1.030)
UROBILINOGEN UA: 0.2 mg/dL (ref 0.0–1.0)
pH: 6 (ref 5.0–8.0)

## 2013-09-26 MED ORDER — TETANUS-DIPHTH-ACELL PERTUSSIS 5-2.5-18.5 LF-MCG/0.5 IM SUSP: 0.5000 mL | Freq: Once | INTRAMUSCULAR | Status: DC

## 2013-09-26 NOTE — Progress Notes (Signed)
P= 90 Edema in feet.  Tdap today.

## 2013-09-26 NOTE — Progress Notes (Signed)
S: 23 yo G1 @[redacted]w[redacted]d   - having cramps - can she take potassium supplements - no vb, lof, ctx. +FM  O: see flowsheet  A/P Cramps- ok to take potassium foods like bananas and orange juice  Discussed labor precautions vtx on leopolds today  -f/u in 2 weeks

## 2013-10-10 ENCOUNTER — Ambulatory Visit (INDEPENDENT_AMBULATORY_CARE_PROVIDER_SITE_OTHER): Payer: No Typology Code available for payment source | Admitting: Obstetrics and Gynecology

## 2013-10-10 ENCOUNTER — Encounter: Payer: Self-pay | Admitting: Obstetrics and Gynecology

## 2013-10-10 VITALS — BP 113/69 | Wt 203.8 lb

## 2013-10-10 DIAGNOSIS — Z3402 Encounter for supervision of normal first pregnancy, second trimester: Secondary | ICD-10-CM

## 2013-10-10 DIAGNOSIS — O99019 Anemia complicating pregnancy, unspecified trimester: Secondary | ICD-10-CM

## 2013-10-10 LAB — POCT URINALYSIS DIP (DEVICE)
BILIRUBIN URINE: NEGATIVE
Glucose, UA: NEGATIVE mg/dL
HGB URINE DIPSTICK: NEGATIVE
KETONES UR: NEGATIVE mg/dL
Nitrite: NEGATIVE
PROTEIN: 30 mg/dL — AB
SPECIFIC GRAVITY, URINE: 1.02 (ref 1.005–1.030)
Urobilinogen, UA: 0.2 mg/dL (ref 0.0–1.0)
pH: 7 (ref 5.0–8.0)

## 2013-10-10 LAB — OB RESULTS CONSOLE GBS: GBS: POSITIVE

## 2013-10-10 LAB — OB RESULTS CONSOLE GC/CHLAMYDIA
Chlamydia: NEGATIVE
Gonorrhea: NEGATIVE

## 2013-10-10 NOTE — Patient Instructions (Signed)

## 2013-10-10 NOTE — Progress Notes (Signed)
P=82 C/o of vaginal pressure.

## 2013-10-10 NOTE — Progress Notes (Signed)
Doing well. Plans Depo. Cultures done. Urine dip pending.

## 2013-10-12 LAB — CULTURE, BETA STREP (GROUP B ONLY)

## 2013-10-12 LAB — GC/CHLAMYDIA PROBE AMP
CT Probe RNA: NEGATIVE
GC Probe RNA: NEGATIVE

## 2013-10-16 ENCOUNTER — Encounter: Payer: Self-pay | Admitting: General Practice

## 2013-10-17 ENCOUNTER — Ambulatory Visit (INDEPENDENT_AMBULATORY_CARE_PROVIDER_SITE_OTHER): Payer: No Typology Code available for payment source | Admitting: Obstetrics and Gynecology

## 2013-10-17 ENCOUNTER — Encounter: Payer: Self-pay | Admitting: Obstetrics and Gynecology

## 2013-10-17 VITALS — BP 115/67 | Wt 206.1 lb

## 2013-10-17 DIAGNOSIS — O09899 Supervision of other high risk pregnancies, unspecified trimester: Secondary | ICD-10-CM

## 2013-10-17 DIAGNOSIS — O99820 Streptococcus B carrier state complicating pregnancy: Secondary | ICD-10-CM | POA: Insufficient documentation

## 2013-10-17 DIAGNOSIS — Z22330 Carrier of Group B streptococcus: Secondary | ICD-10-CM

## 2013-10-17 NOTE — Progress Notes (Signed)
P= 84 Edema in feet.  Intermittent vaginal pressure and irregular contractions.

## 2013-10-17 NOTE — Patient Instructions (Signed)

## 2013-10-17 NOTE — Progress Notes (Signed)
GBS carriage discussed. Encouraged classes. Plans breastfeed. S/sx labor discussed. RLP relief measures.

## 2013-10-19 ENCOUNTER — Inpatient Hospital Stay (HOSPITAL_COMMUNITY)
Admission: AD | Admit: 2013-10-19 | Discharge: 2013-10-19 | Disposition: A | Discharge status: Home / Self Care | Payer: No Typology Code available for payment source | Source: Ambulatory Visit | Attending: Family Medicine | Admitting: Family Medicine

## 2013-10-19 ENCOUNTER — Encounter (HOSPITAL_COMMUNITY): Payer: Self-pay | Admitting: *Deleted

## 2013-10-19 DIAGNOSIS — D509 Iron deficiency anemia, unspecified: Secondary | ICD-10-CM

## 2013-10-19 DIAGNOSIS — Z87891 Personal history of nicotine dependence: Secondary | ICD-10-CM | POA: Insufficient documentation

## 2013-10-19 DIAGNOSIS — O99012 Anemia complicating pregnancy, second trimester: Secondary | ICD-10-CM | POA: Diagnosis present

## 2013-10-19 DIAGNOSIS — O99019 Anemia complicating pregnancy, unspecified trimester: Secondary | ICD-10-CM | POA: Diagnosis present

## 2013-10-19 DIAGNOSIS — O2650 Maternal hypotension syndrome, unspecified trimester: Secondary | ICD-10-CM | POA: Insufficient documentation

## 2013-10-19 LAB — CBC
HEMATOCRIT: 28.2 % — AB (ref 36.0–46.0)
HEMOGLOBIN: 9.2 g/dL — AB (ref 12.0–15.0)
MCH: 28.6 pg (ref 26.0–34.0)
MCHC: 32.6 g/dL (ref 30.0–36.0)
MCV: 87.6 fL (ref 78.0–100.0)
Platelets: 228 10*3/uL (ref 150–400)
RBC: 3.22 MIL/uL — ABNORMAL LOW (ref 3.87–5.11)
RDW: 13.3 % (ref 11.5–15.5)
WBC: 10.1 10*3/uL (ref 4.0–10.5)

## 2013-10-19 LAB — COMPREHENSIVE METABOLIC PANEL
ALBUMIN: 2.6 g/dL — AB (ref 3.5–5.2)
ALK PHOS: 141 U/L — AB (ref 39–117)
ALT: 16 U/L (ref 0–35)
AST: 19 U/L (ref 0–37)
BILIRUBIN TOTAL: 0.2 mg/dL — AB (ref 0.3–1.2)
BUN: 7 mg/dL (ref 6–23)
CO2: 24 mEq/L (ref 19–32)
CREATININE: 0.65 mg/dL (ref 0.50–1.10)
Calcium: 8.9 mg/dL (ref 8.4–10.5)
Chloride: 100 mEq/L (ref 96–112)
GFR calc non Af Amer: >90 mL/min (ref >90)
GLUCOSE: 88 mg/dL (ref 70–99)
Potassium: 3.9 mEq/L (ref 3.7–5.3)
Sodium: 136 mEq/L — ABNORMAL LOW (ref 137–147)
TOTAL PROTEIN: 6.1 g/dL (ref 6.0–8.3)

## 2013-10-19 NOTE — Discharge Instructions (Signed)
Make sure to take your prenatal vitamin daily.   Iron-Rich Diet An iron-rich diet contains foods that are good sources of iron. Iron is an important mineral that helps your body produce hemoglobin. Hemoglobin is a protein in red blood cells that carries oxygen to the body's tissues. Sometimes, the iron level in your blood can be low. This may be caused by:  A lack of iron in your diet.  Blood loss.  Times of growth, such as during pregnancy or during a child's growth and development. Low levels of iron can cause a decrease in the number of red blood cells. This can result in iron deficiency anemia. Iron deficiency anemia symptoms include:  Tiredness.  Weakness.  Irritability.  Increased chance of infection. Here are some recommendations for daily iron intake:  Males older than 25 years of age need 8 mg of iron per day.  Women ages 4919 to 2950 need 18 mg of iron per day.  Pregnant women need 27 mg of iron per day, and women who are over 25 years of age and breastfeeding need 9 mg of iron per day.  Women over the age of 25 need 8 mg of iron per day. SOURCES OF IRON There are 2 types of iron that are found in food: heme iron and nonheme iron. Heme iron is absorbed by the body better than nonheme iron. Heme iron is found in meat, poultry, and fish. Nonheme iron is found in grains, beans, and vegetables. Heme Iron Sources Food / Iron (mg)  Chicken liver, 3 oz (85 g)/ 10 mg  Beef liver, 3 oz (85 g)/ 5.5 mg  Oysters, 3 oz (85 g)/ 8 mg  Beef, 3 oz (85 g)/ 2 to 3 mg  Shrimp, 3 oz (85 g)/ 2.8 mg  [place], 3 oz (85 g)/ 2 mg  Chicken, 3 oz (85 g) / 1 mg  Fish (tuna, halibut), 3 oz (85 g)/ 1 mg  Pork, 3 oz (85 g)/ 0.9 mg Nonheme Iron Sources Food / Iron (mg)  Ready-to-eat breakfast cereal, iron-fortified / 3.9 to 7 mg  Tofu,  cup / 3.4 mg  Kidney beans,  cup / 2.6 mg  Baked potato with skin / 2.7 mg  Asparagus,  cup / 2.2 mg  Avocado / 2 mg  Dried peaches,  cup /  1.6 mg  Raisins,  cup / 1.5 mg  Soy milk, 1 cup / 1.5 mg  Whole-wheat bread, 1 slice / 1.2 mg  Spinach, 1 cup / 0.8 mg  Broccoli,  cup / 0.6 mg IRON ABSORPTION Certain foods can decrease the body's absorption of iron. Try to avoid these foods and beverages while eating meals with iron-containing foods:  Coffee.  Tea.  Fiber.  Soy. Foods containing vitamin C can help increase the amount of iron your body absorbs from iron sources, especially from nonheme sources. Eat foods with vitamin C along with iron-containing foods to increase your iron absorption. Foods that are high in vitamin C include many fruits and vegetables. Some good sources are:  Fresh orange juice.  Oranges.  Strawberries.  Mangoes.  Grapefruit.  Red bell peppers.  Green bell peppers.  Broccoli.  Potatoes with skin.  Tomato juice. Document Released: 02/10/2005 Document Revised: 09/21/2011 Document Reviewed: 12/18/2010 [facility] Patient Information 2014 [place]ExitCare, [place].

## 2013-10-19 NOTE — MAU Note (Signed)
Was at work, became nauseated, diaphoretic, dizzy and weak.  Laid down; BP and blood sugar checked- BP was low.  Cool cloth given- with that she started feeling better.

## 2013-10-19 NOTE — MAU Provider Note (Signed)
 @MAUPATCONTACT @  Chief Complaint:  Near Syncope   Deborah Mahoney is  23 y.o. G1P0 at 5439w6d presents complaining of Near Syncope  Reports that she went to work this morning and felt fine. Symptoms started with nausea at about 1215 today, sat down for about 2-3 mins, started to feel dizzy, seeing spots, generalized sweating, "overall not feeling good". Denies LOC or fall. EMS was called immediately, prior to arrival nurses at work [facility] checked BP, temp, and CBG. Noted color change with "looked pale". Felt better soon after with cool rag, and overall improved prior to getting on ambulance.  Of note, reported only eating a banana for breakfast (lighter than usual breakfast), was waiting to eat lunch prior to episode.  Denies any prior similar episodes or any syncopal episodes.  - Good FM - Denies regular contractions or vaginal bleeding - admits normal yellow vaginal discharge (odorless)  PNC - [facility], since 15 weeks, without complications  Obstetrical/Gynecological History: OB History   Grav Para Term Preterm Abortions TAB SAB Ect Mult Living   1              Past Medical History: Past Medical History  Diagnosis Date  . Seasonal allergies     Past Surgical History: History reviewed. No pertinent past surgical history.  Family History: Family History  Problem Relation Age of Onset  . Hypertension Mother   . Hyperlipidemia Father     Social History: History  Substance Use Topics  . Smoking status: Former Games developermoker  . Smokeless tobacco: Never Used  . Alcohol Use: Yes     Comment: occ stoppped with pregnancy    Allergies: No Known Allergies  Meds:  Facility-administered medications prior to admission  Medication Dose Route Frequency Provider Last Rate Last Dose  . Tdap (BOOSTRIX) injection 0.5 mL  0.5 mL Intramuscular Once Vale HavenKeli L Beck, MD       Prescriptions prior to admission  Medication Sig Dispense Refill  . Prenatal Vit-Fe Fumarate-FA  (PRENATAL MULTIVITAMIN) TABS tablet Take 1 tablet by mouth daily at 12 noon.      . ranitidine (ZANTAC) 150 MG tablet Take 1 tablet (150 mg total) by mouth 2 (two) times daily.  60 tablet  5    Physical Exam  Blood pressure 113/66, pulse 91, temperature 98 F (36.7 C), temperature source Oral, resp. rate 18, last menstrual period 12/11/2012, SpO2 99.00%. GENERAL: Well-developed, well-nourished female in no acute distress.  LUNGS: Clear to auscultation bilaterally.  HEART: Regular rate and rhythm. ABDOMEN: Soft, nontender, nondistended, gravid.  EXTREMITIES: Nontender, no edema, 2+ distal pulses. DTR's 2+ FHT:  Baseline rate 130 bpm   Variability moderate  Accelerations present   Decelerations none Contractions: none   Labs: Results for orders placed during the hospital encounter of 10/19/13 (from the past 24 hour(s))  CBC   Collection Time    10/19/13  1:33 PM      Result Value Ref Range   WBC 10.1  4.0 - 10.5 K/uL   RBC 3.22 (*) 3.87 - 5.11 MIL/uL   Hemoglobin 9.2 (*) 12.0 - 15.0 g/dL   HCT 16.128.2 (*) 09.636.0 - 04.546.0 %   MCV 87.6  78.0 - 100.0 fL   MCH 28.6  26.0 - 34.0 pg   MCHC 32.6  30.0 - 36.0 g/dL   RDW 40.913.3  81.111.5 - 91.415.5 %   Platelets 228  150 - 400 K/uL  COMPREHENSIVE METABOLIC PANEL   Collection Time    10/19/13  1:33 PM      Result Value Ref Range   Sodium 136 (*) 137 - 147 mEq/L   Potassium 3.9  3.7 - 5.3 mEq/L   Chloride 100  96 - 112 mEq/L   CO2 24  19 - 32 mEq/L   Glucose, Bld 88  70 - 99 mg/dL   BUN 7  6 - 23 mg/dL   Creatinine, Ser 9.81  0.50 - 1.10 mg/dL   Calcium 8.9  8.4 - 19.1 mg/dL   Total Protein 6.1  6.0 - 8.3 g/dL   Albumin 2.6 (*) 3.5 - 5.2 g/dL   AST 19  0 - 37 U/L   ALT 16  0 - 35 U/L   Alkaline Phosphatase 141 (*) 39 - 117 U/L   Total Bilirubin 0.2 (*) 0.3 - 1.2 mg/dL   GFR calc non Af Amer >90  >90 mL/min   GFR calc Af Amer >90  >90 mL/min   Imaging Studies:  No results found.  Assessment: Deborah Mahoney is  23 y.o. G1P0 at [redacted]w[redacted]d presents  with pre-syncopal episode, likely secondary to anemia in pregnancy, considered vaso-vagal (initial reported low BP) response in setting of decreased PO intake, consistent with historical symptoms, without other concerns as LOC or fall, quick resolution and currently at baseline, stable vitals.  Plan: - CBC remarkable for low hgb at 9.2 (prior 10-11), no evidence of active bleeding - Continue PNV, Iron supplement - CMET (unremarkable) - Asymptomatic and tolerating regular PO diet - FHT - CAT 1, reactive and reassuring - Discharge to home with close follow-up, next [facility] apt already scheduled for 10/24/13  Saralyn Pilar, DO [facility], PGY-1 4/9/20152:47 PM  I have seen this patient and agree with the above resident's note.  LEFTWICH-KIRBY, Blakelee Allington Certified Nurse-Midwife

## 2013-10-20 NOTE — MAU Provider Note (Signed)
Attestation of Attending Supervision of Advanced Practitioner (PA/CNM/NP): Evaluation and management procedures were performed by the Advanced Practitioner under my supervision and collaboration.  I have reviewed the Advanced Practitioner's note and chart, and I agree with the management and plan.  Jacob Stinson, DO Attending Physician Faculty Practice, [facility]  

## 2013-10-23 ENCOUNTER — Inpatient Hospital Stay (HOSPITAL_COMMUNITY)
Admission: AD | Admit: 2013-10-23 | Discharge: 2013-10-23 | Disposition: A | Discharge status: Home / Self Care | Payer: No Typology Code available for payment source | Source: Ambulatory Visit | Attending: Obstetrics & Gynecology | Admitting: Obstetrics & Gynecology

## 2013-10-23 ENCOUNTER — Encounter (HOSPITAL_COMMUNITY): Payer: Self-pay | Admitting: *Deleted

## 2013-10-23 DIAGNOSIS — O99891 Other specified diseases and conditions complicating pregnancy: Secondary | ICD-10-CM | POA: Insufficient documentation

## 2013-10-23 DIAGNOSIS — O9989 Other specified diseases and conditions complicating pregnancy, childbirth and the puerperium: Principal | ICD-10-CM

## 2013-10-23 DIAGNOSIS — R42 Dizziness and giddiness: Secondary | ICD-10-CM | POA: Insufficient documentation

## 2013-10-23 DIAGNOSIS — Z87891 Personal history of nicotine dependence: Secondary | ICD-10-CM | POA: Insufficient documentation

## 2013-10-23 NOTE — MAU Provider Note (Signed)
None     Chief Complaint:  No chief complaint on file.   Deborah Mahoney is  23 y.o. G1P0 at 7340w3d presents complaining of No chief complaint on file. .  She states no contractions are associated without vaginal bleeding, intact membranes, along with active fetal movement. Pt woke this morning and saw spots upon waking and felt light headed. Brief episode and passed within minutes. Pt here for evaluation. Currently asymptomatic with good fetal movement, no LOF, no VB, no Ctx. No other complaints.  Pt states she had similar episode and was evaluated on the 9april. This was much less severe. Pt denies HA, visionchanges other than these spots that have resolved, no RUQ pain, SOB, N/V, D/C, urinary symptoms or other complaints today.   Obstetrical/Gynecological History: OB History   Grav Para Term Preterm Abortions TAB SAB Ect Mult Living   1         0     Past Medical History: Past Medical History  Diagnosis Date  . Seasonal allergies     Past Surgical History: History reviewed. No pertinent past surgical history.  Family History: Family History  Problem Relation Age of Onset  . Hypertension Mother   . Hyperlipidemia Father     Social History: History  Substance Use Topics  . Smoking status: Former Games developermoker  . Smokeless tobacco: Never Used  . Alcohol Use: Yes     Comment: occ stoppped with pregnancy    Allergies: No Known Allergies  Meds:  Facility-administered medications prior to admission  Medication Dose Route Frequency Provider Last Rate Last Dose  . Tdap (BOOSTRIX) injection 0.5 mL  0.5 mL Intramuscular Once Vale HavenKeli L Beck, MD       Prescriptions prior to admission  Medication Sig Dispense Refill  . Prenatal Vit-Fe Fumarate-FA (PRENATAL MULTIVITAMIN) TABS tablet Take 1 tablet by mouth daily at 12 noon.      . ranitidine (ZANTAC) 150 MG tablet Take 1 tablet (150 mg total) by mouth 2 (two) times daily.  60 tablet  5    Review of Systems -   Review of Systems   Constitutional: Negative for fever, chills, weight loss, malaise/fatigue and diaphoresis.  HENT: Negative for hearing loss, ear pain, nosebleeds, congestion, sore throat, neck pain, tinnitus and ear discharge.   Eyes: Negative for blurred vision, double vision, photophobia, pain, discharge and redness.  Respiratory: Negative for cough, hemoptysis, sputum production, shortness of breath, wheezing and stridor.   Cardiovascular: Negative for chest pain, palpitations, orthopnea,  leg swelling  Gastrointestinal: Negative for abdominal pain heartburn, nausea, vomiting, diarrhea, constipation, blood in stool Genitourinary: Negative for dysuria, urgency, frequency, hematuria and flank pain.  Musculoskeletal: Negative for myalgias, back pain, joint pain and falls.  Skin: Negative for itching and rash.  Neurological: Negative for dizziness, tingling, tremors, sensory change, speech change, focal weakness, seizures, loss of consciousness, weakness and headaches.  Endo/Heme/Allergies: Negative for environmental allergies and polydipsia. Does not bruise/bleed easily.  Psychiatric/Behavioral: Negative for depression, suicidal ideas, hallucinations, memory loss and substance abuse. The patient is not nervous/anxious and does not have insomnia.      Physical Exam  Blood pressure 114/64, pulse 91, temperature 98.2 F (36.8 C), temperature source Oral, resp. rate 16, height 5' 6.5" (1.689 m), weight 92.262 kg (203 lb 6.4 oz), last menstrual period 12/11/2012. GENERAL: Well-developed, well-nourished female in no acute distress.  LUNGS: Clear to auscultation bilaterally.  HEART: Regular rate and rhythm. ABDOMEN: Soft, nontender, nondistended, gravid.  EXTREMITIES: Nontender, no edema, 2+  distal pulses. DTR's 2+  FHT:  Baseline rate 130s bpm   Variability moderate  Accelerations present   Decelerations none Contractions: possible 1 and non painful over 3o min   Labs: No results found for this or any  previous visit (from the past 24 hour(s)). Imaging Studies:  No results found.  Assessment: Deborah Mahoney is  23 y.o. G1P0 at 944w3d presents with light headedness that resolved. . Reassurring and reactive fetus Likely a vasovagal episode vs hypotension. Recommend increased water intake vs gatorade. Recommend syncope precautions.  Follow up tomorrow in clinic.    Minta BalsamMichael R Valen Gillison 4/13/20153:21 PM

## 2013-10-23 NOTE — MAU Note (Signed)
Patient states when she woke up at 0830 she was seeing spots and had a headache on the right side. States she went back to sleep and woke up it was gone. Denies contractions, leaking or bleeding. Reports fetal movement but not as much as usual.

## 2013-10-23 NOTE — Discharge Instructions (Signed)
Vasovagal Syncope, Adult  Syncope, commonly known as fainting, is a temporary loss of consciousness. It occurs when the blood flow to the brain is reduced. Vasovagal syncope (also called neurocardiogenic syncope) is a fainting spell in which the blood flow to the brain is reduced because of a sudden drop in heart rate and blood pressure. Vasovagal syncope occurs when the brain and the cardiovascular system (blood vessels) do not adequately communicate and respond to each other. This is the most common cause of fainting. It often occurs in response to fear or some other type of emotional or physical stress. The body has a reaction in which the heart starts beating too slowly or the blood vessels expand, reducing blood pressure. This type of fainting spell is generally considered harmless. However, injuries can occur if a person takes a sudden fall during a fainting spell.   CAUSES   Vasovagal syncope occurs when a person's blood pressure and heart rate decrease suddenly, usually in response to a trigger. Many things and situations can trigger an episode. Some of these include:   · Pain.    · Fear.    · The sight of blood or medical procedures, such as blood being drawn from a vein.    · Common activities, such as coughing, swallowing, stretching, or going to the bathroom.    · Emotional stress.    · Prolonged standing, especially in a warm environment.    · Lack of sleep or rest.    · Prolonged lack of food.    · Prolonged lack of fluids.    · Recent illness.  · The use of certain drugs that affect blood pressure, such as cocaine, alcohol, marijuana, inhalants, and opiates.    SYMPTOMS   Before the fainting episode, you may:   · Feel dizzy or light headed.    · Become pale.  · Sense that you are going to faint.    · Feel like the room is spinning.    · Have tunnel vision, only seeing directly in front of you.    · Feel sick to your stomach (nauseous).    · See spots or slowly lose vision.    · Hear ringing in your  ears.    · Have a headache.    · Feel warm and sweaty.    · Feel a sensation of pins and needles.  During the fainting spell, you will generally be unconscious for no longer than a couple minutes before waking up and returning to normal. If you get up too quickly before your body can recover, you may faint again. Some twitching or jerky movements may occur during the fainting spell.   DIAGNOSIS   Your caregiver will ask about your symptoms, take a medical history, and perform a physical exam. Various tests may be done to rule out other causes of fainting. These may include blood tests and tests to check the heart, such as electrocardiography, echocardiography, and possibly an electrophysiology study. When other causes have been ruled out, a test may be done to check the body's response to changes in position (tilt table test).  TREATMENT   Most cases of vasovagal syncope do not require treatment. Your caregiver may recommend ways to avoid fainting triggers and may provide home strategies for preventing fainting. If you must be exposed to a possible trigger, you can drink additional fluids to help reduce your chances of having an episode of vasovagal syncope. If you have warning signs of an oncoming episode, you can respond by positioning yourself favorably (lying down).  If your fainting spells continue, you may be   given medicines to prevent fainting. Some medicines may help make you more resistant to repeated episodes of vasovagal syncope. Special exercises or compression stockings may be recommended. In rare cases, the surgical placement of a pacemaker is considered.  HOME CARE INSTRUCTIONS   · Learn to identify the warning signs of vasovagal syncope.    · Sit or lie down at the first warning sign of a fainting spell. If sitting, put your head down between your legs. If you lie down, swing your legs up in the air to increase blood flow to the brain.    · Avoid hot tubs and saunas.  · Avoid prolonged  standing.  · Drink enough fluids to keep your urine clear or pale yellow. Avoid caffeine.  · Increase salt in your diet as directed by your caregiver.    · If you have to stand for a long time, perform movements such as:    · Crossing your legs.    · Flexing and stretching your leg muscles.    · Squatting.    · Moving your legs.    · Bending over.    · Only take over-the-counter or prescription medicines as directed by your caregiver. Do not suddenly stop any medicines without asking your caregiver first.   SEEK MEDICAL CARE IF:   · Your fainting spells continue or happen more frequently in spite of treatment.    · You lose consciousness for more than a couple minutes.  · You have fainting spells during or after exercising or after being startled.    · You have new symptoms that occur with the fainting spells, such as:    · Shortness of breath.  · Chest pain.    · Irregular heartbeat.    · You have episodes of twitching or jerky movements that last longer than a few seconds.  · You have episodes of twitching or jerky movements without obvious fainting.  SEEK IMMEDIATE MEDICAL CARE IF:   · You have injuries or bleeding after a fainting spell.    · You have episodes of twitching or jerky movements that last longer than 5 minutes.    · You have more than one spell of twitching or jerky movements before returning to consciousness after fainting.  MAKE SURE YOU:   · Understand these instructions.  · Will watch your condition.  · Will get help right away if you are not doing well or get worse.  Document Released: 06/15/2012 Document Reviewed: 06/15/2012  ExitCare® Patient Information ©2014 [employer], LLC.

## 2013-10-23 NOTE — MAU Provider Note (Signed)
Attestation of Attending Supervision of Advanced Practitioner (CNM/NP): Evaluation and management procedures were performed by the Advanced Practitioner under my supervision and collaboration.  I have reviewed the Advanced Practitioner's note and chart, and I agree with the management and plan.  Deborah Mahoney 4:21 PM

## 2013-10-24 ENCOUNTER — Ambulatory Visit (INDEPENDENT_AMBULATORY_CARE_PROVIDER_SITE_OTHER): Payer: No Typology Code available for payment source | Admitting: Family

## 2013-10-24 VITALS — BP 108/67 | Wt 202.9 lb

## 2013-10-24 DIAGNOSIS — Z3400 Encounter for supervision of normal first pregnancy, unspecified trimester: Secondary | ICD-10-CM

## 2013-10-24 DIAGNOSIS — Z3402 Encounter for supervision of normal first pregnancy, second trimester: Secondary | ICD-10-CM

## 2013-10-24 LAB — POCT URINALYSIS DIP (DEVICE)
Glucose, UA: NEGATIVE mg/dL
Hgb urine dipstick: NEGATIVE
Nitrite: NEGATIVE
PH: 6.5 (ref 5.0–8.0)
PROTEIN: 100 mg/dL — AB
Urobilinogen, UA: 0.2 mg/dL (ref 0.0–1.0)

## 2013-10-24 MED ORDER — FERROUS SULFATE 325 (65 FE) MG PO TABS: 325.0000 mg | ORAL_TABLET | Freq: Two times a day (BID) | ORAL | Status: DC

## 2013-10-24 NOTE — Progress Notes (Signed)
Went to MAU due to dizziness > anemia > RX sent to pharmacy today.  Reports groin pain with walking.  Denies vaginal bleeding or leaking of fluid.  Lengthy discussion on signs of labor.  Urine culture sent.

## 2013-10-24 NOTE — Progress Notes (Signed)
Pulse: 100 Went to MAU yesterday because she thought her bp was too low but everything checked out okay.

## 2013-10-26 LAB — CULTURE, OB URINE: Colony Count: 80000

## 2013-10-31 ENCOUNTER — Ambulatory Visit (INDEPENDENT_AMBULATORY_CARE_PROVIDER_SITE_OTHER): Payer: No Typology Code available for payment source | Admitting: Advanced Practice Midwife

## 2013-10-31 ENCOUNTER — Encounter: Payer: Medicaid Other | Admitting: Family Medicine

## 2013-10-31 VITALS — BP 114/69 | HR 84 | Temp 97.9°F | Wt 202.5 lb

## 2013-10-31 DIAGNOSIS — Z3480 Encounter for supervision of other normal pregnancy, unspecified trimester: Secondary | ICD-10-CM

## 2013-10-31 NOTE — Progress Notes (Signed)
Patient reports a lot of pain and pressure in pelvis and vagina, feels kind of like contractions; states she woke up about an hour ago from sleeping and she was wet, not sure if her water has broke or not

## 2013-10-31 NOTE — Progress Notes (Signed)
Doing well.  Good fetal movement, denies vaginal bleeding, regular contractions.  Reported leakage of clear fluid, enough to soak underwear x1 episode this morning, none since.  Speculum exam with negative pooling. Ferning slide also negative.

## 2013-11-01 LAB — WET PREP, GENITAL
CLUE CELLS WET PREP: NONE SEEN
Trich, Wet Prep: NONE SEEN

## 2013-11-01 NOTE — Progress Notes (Signed)
Pt called the front desk and informed me that when she went to use the bathroom she has a mucousy discharge and that she has been having this cramping feeling all day.  Pt wanted to know if she was in labor.  I advised pt that it sounds as if she is having braxton hicks which is normal and the discharge is also normal.  Pt stated that it did not have an odor either itchiness.  I advised pt that if her contractions become where walking does not relieve them and a less than 10 minutes apart to come to MAU.  I also informed pt that if she has like a constant leak of watery fluid that it could be sign that her water broke and to go to MAU.  Pt stated understanding with no further questions.

## 2013-11-02 ENCOUNTER — Inpatient Hospital Stay (HOSPITAL_COMMUNITY)
Admission: AD | Admit: 2013-11-02 | Discharge: 2013-11-02 | Disposition: A | Discharge status: Home / Self Care | Payer: No Typology Code available for payment source | Source: Ambulatory Visit | Attending: Obstetrics & Gynecology | Admitting: Obstetrics & Gynecology

## 2013-11-02 ENCOUNTER — Encounter (HOSPITAL_COMMUNITY): Payer: Self-pay | Admitting: *Deleted

## 2013-11-02 DIAGNOSIS — O479 False labor, unspecified: Secondary | ICD-10-CM | POA: Insufficient documentation

## 2013-11-02 HISTORY — DX: Unspecified infectious disease: B99.9

## 2013-11-02 HISTORY — DX: Anemia, unspecified: D64.9

## 2013-11-02 NOTE — Discharge Instructions (Signed)

## 2013-11-02 NOTE — MAU Note (Signed)
Contractions started last night, getting closer and stronger. No bleeding or leaking.  Mucous d/c. Was 1-2 cm when last checked.

## 2013-11-03 ENCOUNTER — Inpatient Hospital Stay (HOSPITAL_COMMUNITY): Payer: No Typology Code available for payment source | Admitting: Anesthesiology

## 2013-11-03 ENCOUNTER — Encounter (HOSPITAL_COMMUNITY): Payer: No Typology Code available for payment source | Admitting: Anesthesiology

## 2013-11-03 ENCOUNTER — Inpatient Hospital Stay (HOSPITAL_COMMUNITY)
Admission: AD | Admit: 2013-11-03 | Discharge: 2013-11-05 | DRG: 775 | Disposition: A | Discharge status: Home / Self Care | Payer: No Typology Code available for payment source | Source: Ambulatory Visit | Attending: Obstetrics & Gynecology | Admitting: Obstetrics & Gynecology

## 2013-11-03 ENCOUNTER — Encounter (HOSPITAL_COMMUNITY): Payer: Self-pay | Admitting: *Deleted

## 2013-11-03 DIAGNOSIS — O9989 Other specified diseases and conditions complicating pregnancy, childbirth and the puerperium: Secondary | ICD-10-CM

## 2013-11-03 DIAGNOSIS — Z8249 Family history of ischemic heart disease and other diseases of the circulatory system: Secondary | ICD-10-CM

## 2013-11-03 DIAGNOSIS — O99892 Other specified diseases and conditions complicating childbirth: Secondary | ICD-10-CM | POA: Diagnosis present

## 2013-11-03 DIAGNOSIS — F1210 Cannabis abuse, uncomplicated: Secondary | ICD-10-CM | POA: Diagnosis present

## 2013-11-03 DIAGNOSIS — O9902 Anemia complicating childbirth: Secondary | ICD-10-CM | POA: Diagnosis present

## 2013-11-03 DIAGNOSIS — Z22330 Carrier of Group B streptococcus: Secondary | ICD-10-CM

## 2013-11-03 DIAGNOSIS — Z87891 Personal history of nicotine dependence: Secondary | ICD-10-CM

## 2013-11-03 DIAGNOSIS — O99344 Other mental disorders complicating childbirth: Secondary | ICD-10-CM | POA: Diagnosis present

## 2013-11-03 DIAGNOSIS — D649 Anemia, unspecified: Secondary | ICD-10-CM | POA: Diagnosis present

## 2013-11-03 DIAGNOSIS — IMO0001 Reserved for inherently not codable concepts without codable children: Secondary | ICD-10-CM

## 2013-11-03 LAB — CBC
HCT: 29.7 % — ABNORMAL LOW (ref 36.0–46.0)
Hemoglobin: 10 g/dL — ABNORMAL LOW (ref 12.0–15.0)
MCH: 28.6 pg (ref 26.0–34.0)
MCHC: 33.7 g/dL (ref 30.0–36.0)
MCV: 84.9 fL (ref 78.0–100.0)
PLATELETS: 266 10*3/uL (ref 150–400)
RBC: 3.5 MIL/uL — ABNORMAL LOW (ref 3.87–5.11)
RDW: 13.5 % (ref 11.5–15.5)
WBC: 13 10*3/uL — AB (ref 4.0–10.5)

## 2013-11-03 LAB — RPR

## 2013-11-03 MED ORDER — DEXTROSE 5 % IV SOLN
5.0000 10*6.[IU] | Freq: Once | INTRAVENOUS | Status: AC
  Administered 2013-11-03: 5 10*6.[IU] via INTRAVENOUS
  Filled 2013-11-03: qty 5

## 2013-11-03 MED ORDER — EPHEDRINE 5 MG/ML INJ
10.0000 mg | INTRAVENOUS | Status: DC | PRN
  Filled 2013-11-03: qty 2

## 2013-11-03 MED ORDER — LACTATED RINGERS IV SOLN: 500.0000 mL | INTRAVENOUS | Status: DC | PRN

## 2013-11-03 MED ORDER — PHENYLEPHRINE 40 MCG/ML (10ML) SYRINGE FOR IV PUSH (FOR BLOOD PRESSURE SUPPORT)
80.0000 ug | PREFILLED_SYRINGE | INTRAVENOUS | Status: DC | PRN
  Filled 2013-11-03: qty 2

## 2013-11-03 MED ORDER — PHENYLEPHRINE 40 MCG/ML (10ML) SYRINGE FOR IV PUSH (FOR BLOOD PRESSURE SUPPORT)
80.0000 ug | PREFILLED_SYRINGE | INTRAVENOUS | Status: DC | PRN
Start: 2013-11-03 — End: 2013-11-04
  Filled 2013-11-03: qty 10
  Filled 2013-11-03: qty 2

## 2013-11-03 MED ORDER — LIDOCAINE HCL (PF) 1 % IJ SOLN
30.0000 mL | INTRAMUSCULAR | Status: DC | PRN
  Filled 2013-11-03: qty 30

## 2013-11-03 MED ORDER — PENICILLIN G POTASSIUM 5000000 UNITS IJ SOLR
2.5000 10*6.[IU] | INTRAVENOUS | Status: DC
  Administered 2013-11-03 (×4): 2.5 10*6.[IU] via INTRAVENOUS
  Filled 2013-11-03 (×9): qty 2.5

## 2013-11-03 MED ORDER — LACTATED RINGERS IV SOLN
500.0000 mL | Freq: Once | INTRAVENOUS | Status: AC
  Administered 2013-11-03: 500 mL via INTRAVENOUS

## 2013-11-03 MED ORDER — OXYTOCIN 40 UNITS IN LACTATED RINGERS INFUSION - SIMPLE MED
62.5000 mL/h | INTRAVENOUS | Status: DC
  Filled 2013-11-03: qty 1000

## 2013-11-03 MED ORDER — EPHEDRINE 5 MG/ML INJ
10.0000 mg | INTRAVENOUS | Status: DC | PRN
  Filled 2013-11-03: qty 2
  Filled 2013-11-03: qty 4

## 2013-11-03 MED ORDER — OXYTOCIN BOLUS FROM INFUSION
500.0000 mL | INTRAVENOUS | Status: DC
  Administered 2013-11-04: 500 mL via INTRAVENOUS

## 2013-11-03 MED ORDER — TERBUTALINE SULFATE 1 MG/ML IJ SOLN
0.2500 mg | Freq: Once | INTRAMUSCULAR | Status: AC | PRN
Start: 2013-11-03 — End: 2013-11-03

## 2013-11-03 MED ORDER — FLEET ENEMA 7-19 GM/118ML RE ENEM: 1.0000 | ENEMA | Freq: Every day | RECTAL | Status: DC | PRN

## 2013-11-03 MED ORDER — ACETAMINOPHEN 325 MG PO TABS: 650.0000 mg | ORAL_TABLET | ORAL | Status: DC | PRN

## 2013-11-03 MED ORDER — FENTANYL 2.5 MCG/ML BUPIVACAINE 1/10 % EPIDURAL INFUSION (WH - ANES)
INTRAMUSCULAR | Status: DC | PRN
  Administered 2013-11-03: 14 mL/h via EPIDURAL

## 2013-11-03 MED ORDER — FENTANYL 2.5 MCG/ML BUPIVACAINE 1/10 % EPIDURAL INFUSION (WH - ANES)
14.0000 mL/h | INTRAMUSCULAR | Status: DC | PRN
  Administered 2013-11-03 (×2): 14 mL/h via EPIDURAL
  Filled 2013-11-03 (×3): qty 125

## 2013-11-03 MED ORDER — OXYCODONE-ACETAMINOPHEN 5-325 MG PO TABS: 1.0000 | ORAL_TABLET | ORAL | Status: DC | PRN

## 2013-11-03 MED ORDER — ONDANSETRON HCL 4 MG/2ML IJ SOLN: 4.0000 mg | Freq: Four times a day (QID) | INTRAMUSCULAR | Status: DC | PRN

## 2013-11-03 MED ORDER — OXYTOCIN 40 UNITS IN LACTATED RINGERS INFUSION - SIMPLE MED
1.0000 m[IU]/min | INTRAVENOUS | Status: DC
  Administered 2013-11-03: 2 m[IU]/min via INTRAVENOUS

## 2013-11-03 MED ORDER — CITRIC ACID-SODIUM CITRATE 334-500 MG/5ML PO SOLN: 30.0000 mL | ORAL | Status: DC | PRN

## 2013-11-03 MED ORDER — LACTATED RINGERS IV SOLN
INTRAVENOUS | Status: DC
  Administered 2013-11-03: 14:00:00 via INTRAVENOUS

## 2013-11-03 MED ORDER — FENTANYL CITRATE 0.05 MG/ML IJ SOLN
50.0000 ug | INTRAMUSCULAR | Status: DC | PRN
  Administered 2013-11-03 (×3): 50 ug via INTRAVENOUS
  Filled 2013-11-03 (×3): qty 2

## 2013-11-03 MED ORDER — IBUPROFEN 600 MG PO TABS
600.0000 mg | ORAL_TABLET | Freq: Four times a day (QID) | ORAL | Status: DC | PRN
Start: 2013-11-03 — End: 2013-11-04
  Administered 2013-11-04: 600 mg via ORAL
  Filled 2013-11-03: qty 1

## 2013-11-03 MED ORDER — LIDOCAINE HCL (PF) 1 % IJ SOLN
INTRAMUSCULAR | Status: DC | PRN
  Administered 2013-11-03 (×2): 4 mL

## 2013-11-03 MED ORDER — DIPHENHYDRAMINE HCL 50 MG/ML IJ SOLN: 12.5000 mg | INTRAMUSCULAR | Status: DC | PRN

## 2013-11-03 NOTE — Progress Notes (Signed)
Deborah Mahoney is a 23 y.o. G1P0 at 7862w0d   Subjective: Pushed earlier x approx 30 mins well, and then began having hip pain and pushed x 30 additional minutes with less effort; currently laboring down x 40 mins  Objective: BP 119/69  Pulse 84  Temp(Src) 98.2 F (36.8 C) (Axillary)  Resp 18  Ht 5' 6.5" (1.689 m)  Wt 92.08 kg (203 lb)  BMI 32.28 kg/m2  SpO2 99%  LMP 12/11/2012 I/O last 3 completed shifts: In: -  Out: 1000 [Urine:1000] Total I/O In: -  Out: 150 [Urine:150]  FHT:  FHR: 140-150 bpm, variability: moderate,  accelerations:  Present,  decelerations:  Present early onset variables; V-shaped with ctx to 70s with spont return UC:   regular, every 2-3 minutes with Pitocin @ 8 mu/min SVE:   Dilation: 10 Effacement (%): 90 Station: +2 Exam by:: L.Stubbs, RN  Labs: Lab Results  Component Value Date   WBC 13.0* 11/03/2013   HGB 10.0* 11/03/2013   HCT 29.7* 11/03/2013   MCV 84.9 11/03/2013   PLT 266 11/03/2013    Assessment / Plan: End 1st stage/laboring down  Will allow to labor down x 2 hrs max and then resume pushing   Deborah Mahoney 11/03/2013, 11:00 PM

## 2013-11-03 NOTE — Anesthesia Procedure Notes (Signed)
Epidural Patient location during procedure: OB Start time: 11/03/2013 10:43 AM  Staffing Anesthesiologist: Izac Faulkenberry A. Performed by: anesthesiologist   Preanesthetic Checklist Completed: patient identified, site marked, surgical consent, pre-op evaluation, timeout performed, IV checked, risks and benefits discussed and monitors and equipment checked  Epidural Patient position: sitting Prep: site prepped and draped and DuraPrep Patient monitoring: continuous pulse ox and blood pressure Approach: midline Location: L3-L4 Injection technique: LOR air  Needle:  Needle type: Tuohy  Needle gauge: 17 G Needle length: 9 cm and 9 Needle insertion depth: 5 cm cm Catheter type: closed end flexible Catheter size: 19 Gauge Catheter at skin depth: 10 cm Test dose: negative and Other  Assessment Events: blood not aspirated, injection not painful, no injection resistance, negative IV test and no paresthesia  Additional Notes Patient identified. Risks and benefits discussed including failed block, incomplete  Pain control, post dural puncture headache, nerve damage, paralysis, blood pressure Changes, nausea, vomiting, reactions to medications-both toxic and allergic and post Partum back pain. All questions were answered. Patient expressed understanding and wished to proceed. Sterile technique was used throughout procedure. Epidural site was Dressed with sterile barrier dressing. No paresthesias, signs of intravascular injection Or signs of intrathecal spread were encountered.  Patient was more comfortable after the epidural was dosed. Please see RN's note for documentation of vital signs and FHR which are stable.

## 2013-11-03 NOTE — MAU Note (Signed)
labor

## 2013-11-03 NOTE — Progress Notes (Signed)
Deborah Mahoney is a 23 y.o. G1P0 at 4669w0d admitted for rupture of membranes  Subjective: S/p epidural, much more comfortable  Objective: BP 108/70  Pulse 80  Temp(Src) 98 F (36.7 C) (Oral)  Resp 18  Ht 5' 6.5" (1.689 m)  Wt 92.08 kg (203 lb)  BMI 32.28 kg/m2  SpO2 99%  LMP 12/11/2012      FHT:  FHR: 130s bpm, variability: moderate,  accelerations:  Present,  decelerations:  Present occassional variable UC:   irregular, every 4-6 minutes SVE:   Dilation: 8 Effacement (%): 90 Station: 0 Exam by:: dr Zaine Elsass AROM Clear Labs: Lab Results  Component Value Date   WBC 13.0* 11/03/2013   HGB 10.0* 11/03/2013   HCT 29.7* 11/03/2013   MCV 84.9 11/03/2013   PLT 266 11/03/2013    Assessment / Plan: Spontaneous labor, progressing normally  Labor: Progressing normally and AROM on this exam with clear fluid Preeclampsia:  no signs or symptoms of toxicity Fetal Wellbeing:  Category II Pain Control:  Epidural working I/D:  GBS+ on PCN Anticipated MOD:  NSVD  Deborah Mahoney 11/03/2013, 12:12 PM

## 2013-11-03 NOTE — Progress Notes (Signed)
Deborah Mahoney is a 23 y.o. G1P0 at 3583w0d admitted for rupture of membranes  Subjective: S/p epidural, much more comfortable.   Objective: BP 106/58  Pulse 73  Temp(Src) 98.2 F (36.8 C) (Oral)  Resp 18  Ht 5' 6.5" (1.689 m)  Wt 92.08 kg (203 lb)  BMI 32.28 kg/m2  SpO2 99%  LMP 12/11/2012   Total I/O In: -  Out: 600 [Urine:600]  FHT:  FHR: 130s bpm, variability: moderate,  accelerations:  Present,  decelerations:  Present occassional variable UC:   irregular, every 8 minutes SVE:   Dilation: 8 Effacement (%):  (RN exam 6-7/90/-0) Station: 0 Exam by::  (no change-dr Petrina Melby) AROM Clear Labs: Lab Results  Component Value Date   WBC 13.0* 11/03/2013   HGB 10.0* 11/03/2013   HCT 29.7* 11/03/2013   MCV 84.9 11/03/2013   PLT 266 11/03/2013    Assessment / Plan: Spontaneous labor, progressing normally  Labor: Progressing normally and AROM on this exam with clear fluid, IUPC with inadequate ctx, starting Pit Preeclampsia:  no signs or symptoms of toxicity Fetal Wellbeing:  Category II Pain Control:  Epidural working I/D:  GBS+ on PCN Anticipated MOD:  NSVD  Deborah Mahoney 11/03/2013, 2:58 PM

## 2013-11-03 NOTE — MAU Note (Signed)
Pt states she has been having contractions all day. 

## 2013-11-03 NOTE — H&P (Signed)
[name] is a 23 y.o. female G1P0 with IUP at 8031w0d presenting for SOL and SROM (11/03/13 @ 0300, clear). Pt states she started having significant contractions starting around 10:00am (11/02/13), previously with only pressure but no regular contractions. Currently contractions with increased strength and has been having regular, every 5 minutes contractions.  Admits LOF with clear ruptured membranes, previously mucus plug discharge. Good Fetal Movement. Denies vaginal bleeding.  Note: PCP Dr. Benjamin Stainhekkekandam ([facility])  Pren[facility] History/Complications[facility], dating by early US (13wk). Pregnancy without significant complications. Notable for primigravida. - No recent US for EFW  Past Medical History: Past Medical History  Diagnosis Date  . Seasonal allergies   . Anemia   . Infection     UTI    Past Surgical History: Past Surgical History  Procedure Laterality Date  . No past surgeries      Obstetrical History: OB History   Grav Para Term Preterm Abortions TAB SAB Ect Mult Living   1         0      Social History: History   Social History  . Marital Status: Single    Spouse Name: N/A    Number of Children: N/A  . Years of Education: N/A   Social History Main Topics  . Smoking status: Former Games developermoker  . Smokeless tobacco: Never Used  . Alcohol Use: Yes     Comment: occ stoppped with pregnancy  . Drug Use: Yes    Special: Marijuana     Comment: stopped when found out pregnant  . Sexual Activity: Yes    Birth Control/ Protection: None   Other Topics Concern  . None   Social History Narrative  . None    Family History: Family History  Problem Relation Age of Onset  . Hypertension Mother   . Hyperlipidemia Father   . Hypertension Father   . Hearing loss Neg Hx     Allergies: No Known Allergies  Prescriptions prior to admission  Medication Sig Dispense Refill  . ranitidine (ZANTAC) 150 MG tablet Take 1 tablet (150 mg  total) by mouth 2 (two) times daily.  60 tablet  5    Review of Systems: Negative unless otherwise stated in History above  Physicial Pulse 84, temperature 98.4 F (36.9 C), resp. rate 20, height 5' 6.5" (1.689 m), weight 92.08 kg (203 lb), last menstrual period 12/11/2012, SpO2 99.00%. General appearance: alert and cooperative, uncomfortable with each contraction Lungs: CTAB Heart: RRR, no murmurs Abdomen: soft, non-tender; bowel sounds normal, gravid appropriate for GA Extremities: Homans sign is negative, no sign of DVT DTR's +2 Presentation: cephalic (confirmed with bedside US) Fetal monitoringBaseline: 140 bpm, Variability: moderate, Accelerations: Reactive and Decelerations: Absent Uterine activity: regular q 5 min, palpable firm Dilation: 3 Effacement (%): 90 Station: -2 Exam by:: b.bethea RN  Prenatal labs: ABO, Rh: O/POS/-- (09/18 1517) Antibody: NEG (09/18 1517) Rubella:  immune RPR: NON REAC (01/20 1038)  HBsAg: NEGATIVE (09/18 1517)  HIV: NON REACTIVE (01/20 1038)  GBS: Positive (03/31 0000)  GTT: 3rd Trimester: 86 nml  Prenatal Transfer Tool  Maternal Diabetes: No Genetic Screening: Declined Maternal Ultrasounds/Referrals: Normal Fetal Ultrasounds or other Referrals:  None Maternal Substance Abuse:  No Significant Maternal Medications:  None Significant Maternal Lab Results: Lab values include: Group B Strep positive  No results found for this or any previous visit (from the past 24 hour(s)).  Assessment: [name] is a [age] y.o. G1P0 at  453w0d presented in SOL and SROM (11/03/13 @0300 , clear).  #Labor: Expectant management, s/p SROM, if no evidence of progression will consider augmentation with Pitocin. #Pain: Fentanyl IV, planning epidural #FWB: CAT-1, EFW 7# by [place] #ID: GBS (positive), start PCN prophylaxis #Feeding: breastfeeding #MOC: Depo, consider Nexplanon #Circ: n/a, Female  Saralyn PilarAlexander Karamalegos, DO [facility],  PGY-1  11/03/2013, 3:22 AM

## 2013-11-03 NOTE — Progress Notes (Addendum)
Deborah Mahoney is a 23 y.o. G1P0 at 2056w0d admitted for rupture of membranes  Subjective: Patient is comfortable after epidural, not really feeling contractions.    Objective: BP 107/67  Pulse 86  Temp(Src) 98.2 F (36.8 C) (Axillary)  Resp 18  Ht 5' 6.5" (1.689 m)  Wt 92.08 kg (203 lb)  BMI 32.28 kg/m2  SpO2 99%  LMP 12/11/2012   Total I/O In: -  Out: 600 [Urine:600]  FHT:  Baseline 140s, + accels, moderate variability, early decels, occasional variable decel, Category II tracing UC:   Per IUPC, Every 2-3 minutes SVE:   Dilation: 9 Effacement (%): 90 Station: 0 Exam by:: dr Deborah Mahoney  Labs: Lab Results  Component Value Date   WBC 13.0* 11/03/2013   HGB 10.0* 11/03/2013   HCT 29.7* 11/03/2013   MCV 84.9 11/03/2013   PLT 266 11/03/2013    Assessment / Plan: 23 yo G1P0 @ 2756w0d GBS+ s/p AROM with clear fluid, has Epidural. Spontaneous labor, progressing normally  Labor: Progressing appropriately, adequate contractions on 782mU/min Preeclampsia:  no signs or symptoms of toxicity Fetal Wellbeing:  Category II tracing Pain Control:  Epidural I/D:  GBS+ on PCN Anticipated MOD:  NSVD  Deborah Mahoney 11/03/2013, 5:35 PM  I spoke with and examined patient and agree with resident's note and plan of care.  Tawana ScaleMichael Ryan Belvia Gotschall, MD OB Fellow 11/03/2013 6:12 PM

## 2013-11-03 NOTE — Anesthesia Preprocedure Evaluation (Signed)
Anesthesia Evaluation  Patient identified by MRN, date of birth, ID band Patient awake    Reviewed: Allergy & Precautions, H&P , Patient's Chart, lab work & pertinent test results  Airway Mallampati: III TM Distance: >3 FB Neck ROM: Full    Dental no notable dental hx. (+) Teeth Intact   Pulmonary former smoker,  breath sounds clear to auscultation  Pulmonary exam normal       Cardiovascular negative cardio ROS  Rhythm:Regular Rate:Normal     Neuro/Psych negative neurological ROS  negative psych ROS   GI/Hepatic negative GI ROS, (+)     substance abuse  marijuana use,   Endo/Other  Obesity  Renal/GU negative Renal ROS  negative genitourinary   Musculoskeletal negative musculoskeletal ROS (+)   Abdominal (+) + obese,   Peds  Hematology  (+) anemia ,   Anesthesia Other Findings   Reproductive/Obstetrics (+) Pregnancy                           Anesthesia Physical Anesthesia Plan  ASA: II  Anesthesia Plan: Epidural   Post-op Pain Management:    Induction:   Airway Management Planned: Natural Airway  Additional Equipment:   Intra-op Plan:   Post-operative Plan:   Informed Consent: I have reviewed the patients History and Physical, chart, labs and discussed the procedure including the risks, benefits and alternatives for the proposed anesthesia with the patient or authorized representative who has indicated his/her understanding and acceptance.     Plan Discussed with: Anesthesiologist  Anesthesia Plan Comments:         Anesthesia Quick Evaluation

## 2013-11-03 NOTE — Progress Notes (Signed)
Deborah Mahoney is a 23 y.o. G1P0 at 5641w0d admitted for rupture of membranes  Subjective: Painful ctx, but tolerating, IV pain meds not adequte, desires epidural  Objective: BP 120/60  Pulse 83  Temp(Src) 98.2 F (36.8 C) (Oral)  Resp 20  Ht 5' 6.5" (1.689 m)  Wt 92.08 kg (203 lb)  BMI 32.28 kg/m2  SpO2 99%  LMP 12/11/2012      FHT:  FHR: 140s bpm, variability: moderate,  accelerations:   ,  decelerations:  Present occassional variable UC:   irregular, every 4-6 minutes SVE:   Dilation: 6 Effacement (%): 90 Station: -2 Exam by:: dr [place]  Labs: Lab Results  Component Value Date   WBC 13.0* 11/03/2013   HGB 10.0* 11/03/2013   HCT 29.7* 11/03/2013   MCV 84.9 11/03/2013   PLT 266 11/03/2013    Assessment / Plan: Spontaneous labor, progressing normally  Labor: Progressing normally and will consider AROM on next exam Preeclampsia:  no signs or symptoms of toxicity Fetal Wellbeing:  Category II Pain Control:  Epidural desires I/D:  GBS+ on PCN Anticipated MOD:  NSVD  Minta BalsamMichael R Cassidey Barrales 11/03/2013, 10:16 AM

## 2013-11-04 ENCOUNTER — Encounter (HOSPITAL_COMMUNITY): Payer: Self-pay

## 2013-11-04 DIAGNOSIS — O99344 Other mental disorders complicating childbirth: Secondary | ICD-10-CM

## 2013-11-04 DIAGNOSIS — D649 Anemia, unspecified: Secondary | ICD-10-CM

## 2013-11-04 DIAGNOSIS — F1210 Cannabis abuse, uncomplicated: Secondary | ICD-10-CM

## 2013-11-04 LAB — CBC
HEMATOCRIT: 27.5 % — AB (ref 36.0–46.0)
HEMOGLOBIN: 9 g/dL — AB (ref 12.0–15.0)
MCH: 28 pg (ref 26.0–34.0)
MCHC: 32.7 g/dL (ref 30.0–36.0)
MCV: 85.4 fL (ref 78.0–100.0)
Platelets: 275 10*3/uL (ref 150–400)
RBC: 3.22 MIL/uL — ABNORMAL LOW (ref 3.87–5.11)
RDW: 13.7 % (ref 11.5–15.5)
WBC: 16.4 10*3/uL — ABNORMAL HIGH (ref 4.0–10.5)

## 2013-11-04 LAB — ABO/RH: ABO/RH(D): O POS

## 2013-11-04 MED ORDER — ZOLPIDEM TARTRATE 5 MG PO TABS: 5.0000 mg | ORAL_TABLET | Freq: Every evening | ORAL | Status: DC | PRN

## 2013-11-04 MED ORDER — PRENATAL MULTIVITAMIN CH
1.0000 | ORAL_TABLET | Freq: Every day | ORAL | Status: DC
  Administered 2013-11-04: 1 via ORAL
  Filled 2013-11-04 (×2): qty 1

## 2013-11-04 MED ORDER — TETANUS-DIPHTH-ACELL PERTUSSIS 5-2.5-18.5 LF-MCG/0.5 IM SUSP: 0.5000 mL | Freq: Once | INTRAMUSCULAR | Status: DC

## 2013-11-04 MED ORDER — DIBUCAINE 1 % RE OINT: 1.0000 "application " | TOPICAL_OINTMENT | RECTAL | Status: DC | PRN

## 2013-11-04 MED ORDER — SIMETHICONE 80 MG PO CHEW
80.0000 mg | CHEWABLE_TABLET | ORAL | Status: DC | PRN
Start: 2013-11-04 — End: 2013-11-05

## 2013-11-04 MED ORDER — SENNOSIDES-DOCUSATE SODIUM 8.6-50 MG PO TABS
2.0000 | ORAL_TABLET | ORAL | Status: DC
  Administered 2013-11-04: 2 via ORAL
  Filled 2013-11-04: qty 2

## 2013-11-04 MED ORDER — ONDANSETRON HCL 4 MG PO TABS
4.0000 mg | ORAL_TABLET | ORAL | Status: DC | PRN
Start: 2013-11-04 — End: 2013-11-05

## 2013-11-04 MED ORDER — LANOLIN HYDROUS EX OINT: TOPICAL_OINTMENT | CUTANEOUS | Status: DC | PRN

## 2013-11-04 MED ORDER — ONDANSETRON HCL 4 MG/2ML IJ SOLN: 4.0000 mg | INTRAMUSCULAR | Status: DC | PRN

## 2013-11-04 MED ORDER — BENZOCAINE-MENTHOL 20-0.5 % EX AERO
1.0000 "application " | INHALATION_SPRAY | CUTANEOUS | Status: DC | PRN
  Administered 2013-11-04: 1 via TOPICAL
  Filled 2013-11-04: qty 56

## 2013-11-04 MED ORDER — DIPHENHYDRAMINE HCL 25 MG PO CAPS: 25.0000 mg | ORAL_CAPSULE | Freq: Four times a day (QID) | ORAL | Status: DC | PRN

## 2013-11-04 MED ORDER — WITCH HAZEL-GLYCERIN EX PADS: 1.0000 "application " | MEDICATED_PAD | CUTANEOUS | Status: DC | PRN

## 2013-11-04 MED ORDER — IBUPROFEN 600 MG PO TABS
600.0000 mg | ORAL_TABLET | Freq: Four times a day (QID) | ORAL | Status: DC
  Administered 2013-11-04 – 2013-11-05 (×5): 600 mg via ORAL
  Filled 2013-11-04 (×5): qty 1

## 2013-11-04 MED ORDER — OXYCODONE-ACETAMINOPHEN 5-325 MG PO TABS
1.0000 | ORAL_TABLET | ORAL | Status: DC | PRN
  Administered 2013-11-04 (×2): 1 via ORAL
  Filled 2013-11-04 (×2): qty 1

## 2013-11-04 MED ORDER — FAMOTIDINE 20 MG PO TABS: 20.0000 mg | ORAL_TABLET | Freq: Every day | ORAL | Status: DC

## 2013-11-04 NOTE — Consult Note (Signed)
Neonatology Note:  Attendance at Code Apgar:  Our team responded to a Code Apgar call to room # 169 following NSVD, due to infant with shoulder dystocia. The requesting physician was Dr. Karamalegos (OB teaching service). The mother is a G1P0 O pos, GBS pos with a history of marijuana use (stopped when pregnant). ROM occurred 21 hours PTD and the fluid was clear. The mother received Pen G > 4 hours before delivery. At delivery, the baby had muscle tone and began to cry spontaneously while cord was being clamped and cut. Our team arrived at 30 seconds of life, at which time the baby was crying and vigorous. I bulb suctioned for a small amount of thick mucous. Ap 8/9. No focal deficits noted on exam, no palpable clavicle fracture. Noted a very small pigmented nevus on midline of lumbar spine. I spoke with the parents in the DR, then transferred the baby to the Pediatrician's care.  Story Conti C. Lamoine Magallon, MD  

## 2013-11-04 NOTE — Anesthesia Postprocedure Evaluation (Signed)
  Anesthesia Post-op Note  Anesthesia Post Note  Patient: Deborah Mahoney  Procedure(s) Performed: * No procedures listed *  Anesthesia type: Epidural  Patient location: Mother/Baby  Post pain: Pain level controlled  Post assessment: Post-op Vital signs reviewed  Last Vitals:  Filed Vitals:   11/04/13 0405  BP: 98/59  Pulse: 73  Temp: 36.5 C  Resp: 18    Post vital signs: Reviewed  Level of consciousness:alert  Complications: No apparent anesthesia complications

## 2013-11-04 NOTE — Progress Notes (Signed)
Clinical Social Work Department PSYCHOSOCIAL ASSESSMENT - MATERNAL/CHILD 11/04/2013  Patient:  Deborah Mahoney,Deborah Mahoney  Account Number:  [id number]  Admit Date:  11/03/2013  Marjo Bickerhilds Name:   Deborah Mahoney    Clinical Social Worker:  Gradyn Shein, LCSW   Date/Time:  11/04/2013 03:30 AM  Date Referred:  11/03/2013   Referral source  Central Nursery     Referred reason  Substance Abuse   Other referral source:    I:  FAMILY / HOME ENVIRONMENT Child's legal guardian:  PARENT  Guardian - Name Guardian - Age Guardian - Address  Deborah Mahoney,Deborah Mahoney 23 [address] [employer]  [place], [place] [id number]  [place], Apolinar JunesBrandon 22 same as above   Other household support members/support persons Other support:    II  PSYCHOSOCIAL DATA Information Source:    Event organiserinancial and Community Resources Employment:   FOB is employed   Surveyor, quantityinancial resources:   If Medicaid - County:    School / Grade:   Maternity Care Coordinator / Child Services Coordination / Early Interventions:  Cultural issues impacting care:    III  STRENGTHS Strengths  Supportive family/friends  Home prepared for Child (including basic supplies)  Adequate Resources   Strength comment:    IV  RISK FACTORS AND CURRENT PROBLEMS Current Problem:       V  SOCIAL WORK ASSESSMENT Acknowledged Social Work consult to assess mother's history of marijuana use.  Mother was receptive to social work intervention.   She is a single parent with no other dependents.  FOB is reportedly supportive.  Parents cohabitate.   Mother admits to use of marijuana around the age of 23. She denies any use during pregnancy.  She denies any other illicit drug use and denies need for treatment. She was informed of the hospital's newborn drug screen policy.  UDS on newborn was negative.   Mother also reports no hx of mental illness.  Informed her of CSW availability.      VI SOCIAL WORK PLAN Social Work Plan  No current Barriers to Discharge   Type  of pt/family education:   If child protective services report - county:   If child protective services report - date:   Information/referral to community resources comment:   Other social work plan:   Will continue to monitor drug screen

## 2013-11-05 MED ORDER — IBUPROFEN 600 MG PO TABS: 600.0000 mg | ORAL_TABLET | Freq: Four times a day (QID) | ORAL | Status: DC

## 2013-11-05 NOTE — Discharge Instructions (Signed)

## 2013-11-05 NOTE — Discharge Summary (Signed)
Obstetric Discharge Summary Reason for Admission: onset of labor and rupture of membranes Prenatal Procedures: NST Intrapartum Procedures: SVD Postpartum Procedures: none Complications-Operative and Postpartum:  mild shoulder dystocia, resolved s/p McRobert's Manuever Hemoglobin  Date Value Ref Range Status  11/04/2013 9.0* 12.0 - 15.0 g/dL Final     HCT  Date Value Ref Range Status  11/04/2013 27.5* 36.0 - 46.0 % Final    Discharge Diagnoses: Term Pregnancy-delivered  Hospital Course:  Deborah Mahoney is a 23 y.o. G1P1001 who presented in SOL with subsequent SROM.  She continued to progress, GBS (positive) adequately treated, and had a SVD with mild shoulder dystocia, resolved with McRobert's Maneuver, delivered viable baby girl, initially assessed by NICU for delayed transition. She was able to ambulate, tolerate PO and void normally. Plans to continue breastfeeding, for contraception interested in Depo but considering Nexplanon. She was discharged home with instructions for postpartum care.    Operative Delivery Note  At 12:20 AM a viable female was delivered via Vaginal, Spontaneous Delivery. Presentation: vertex; Position: Right,, Occiput,  Delivery of the head: spontaneous  First maneuver: McRoberts (11/04/2013 12:24 AM)  Second maneuver: none,  Third maneuver: none,  Verbal consent: obtained from patient.  APGAR: 8, 9; weight: 8 lb 2oz  Placenta status: Intact, Spontaneous.  Cord: 3 vessel  Anesthesia: Epidural  Episiotomy: none  Lacerations: none  Suture Repair: none  Est. Blood Loss (mL): 400cc  Mom to postpartum. Baby to Couplet care / Skin to Skin.  Upon arrival patient was complete and pushing. She continued to push with good effort for >30 mins, continued progression, FHT reactive and reassuring with early decels with each contraction. Baby's head delivered without difficulty, assistance called "Code APGAR" with concern for shoulder dystocia, anterior shoulder delivered  with assistance of McRobert's Maneuver without difficulty. NICU arrived for initial assessment. Baby delivered and placed on maternal abdomen for oral suctioning, drying and stimulation. Cord was clamped and cut by FOB, baby taken to warmer for NICU assessment, responded well to stimulation and suction. Placenta delivered intact with 3V cord. Vaginal canal and perineum was inspected and intact, did not require sutures. Pitocin was started and uterus massaged until bleeding slowed. Counts of sharps, instruments, and lap pads were all correct.  Deborah PilarAlexander Karamalegos, DO  [facility], PGY-1  11/04/2013, 12:40 AM  Physical Exam:  General: alert and cooperative Lochia: appropriate Uterine Fundus: firm DVT Evaluation: No evidence of DVT seen on physical exam. Negative Homan's sign. No cords or calf tenderness. No significant calf/ankle edema.  Discharge Information: Date: 11/05/2013 Activity: pelvic rest Diet: routine Medications: PNV and Ibuprofen Baby feeding: plans to breastfeed Contraception: Depo vs Nexplanon Condition: stable Instructions: refer to practice specific booklet Discharge to: home   Newborn Data: Live born female  Birth Weight: 8 lb 2 oz (3685 g) APGAR: 8, 9  Home with mother.  Deborah PilarAlexander Karamalegos, DO [facility], PGY-1 11/05/2013, 8:32 AM  I spoke with and examined patient and agree with resident's note and plan of care.  Deborah ScaleMichael Ryan Ameera Tigue, MD OB Fellow 11/05/2013 8:46 AM

## 2013-11-07 ENCOUNTER — Encounter: Payer: Medicaid Other | Admitting: Obstetrics & Gynecology

## 2013-11-10 NOTE — Progress Notes (Signed)
Post discharge chart review completed.  

## 2013-12-05 ENCOUNTER — Encounter: Payer: Self-pay | Admitting: Family Medicine

## 2013-12-05 ENCOUNTER — Ambulatory Visit (INDEPENDENT_AMBULATORY_CARE_PROVIDER_SITE_OTHER): Payer: Medicaid Other | Admitting: Family Medicine

## 2013-12-05 DIAGNOSIS — Z30017 Encounter for initial prescription of implantable subdermal contraceptive: Secondary | ICD-10-CM

## 2013-12-05 DIAGNOSIS — Z3402 Encounter for supervision of normal first pregnancy, second trimester: Secondary | ICD-10-CM

## 2013-12-05 DIAGNOSIS — IMO0001 Reserved for inherently not codable concepts without codable children: Secondary | ICD-10-CM

## 2013-12-05 DIAGNOSIS — Z01812 Encounter for preprocedural laboratory examination: Secondary | ICD-10-CM

## 2013-12-05 LAB — POCT PREGNANCY, URINE: Preg Test, Ur: NEGATIVE

## 2013-12-05 MED ORDER — ETONOGESTREL 68 MG ~~LOC~~ IMPL
68.0000 mg | DRUG_IMPLANT | Freq: Once | SUBCUTANEOUS | Status: AC
  Administered 2013-12-05: 68 mg via SUBCUTANEOUS

## 2013-12-05 NOTE — Addendum Note (Signed)
Addended by: Candelaria Stagers E on: 12/05/2013 03:13 PM   Modules accepted: Orders

## 2013-12-05 NOTE — Progress Notes (Signed)
Patient ID: Deborah Mahoney, female   DOB: Jan 24, 1991, 23 y.o.   MRN: [id number] Subjective:    Deborah Mahoney is a 23 y.o. G38P1001 African American female who presents for a postpartum visit. She is 4 weeks postpartum following a spontaneous vaginal delivery. I have fully reviewed the prenatal and intrapartum course. The delivery was at 40 gestational weeks. Outcome: spontaneous vaginal delivery. Anesthesia: epidural. Postpartum course has been uncomplicated. Baby's course has been uncomplicated. Baby is feeding by bottle - gerber good start. Bleeding no bleeding. Bowel function is normal. Bladder function is normal. Patient is not sexually active. Contraception method is Nexplanon. Postpartum depression screening: negative.  The following portions of the patient's history were reviewed and updated as appropriate: allergies, current medications, past medical history, past surgical history and problem list.  Review of Systems Pertinent items are noted in HPI.   Filed Vitals:   12/05/13 1405  BP: 106/71  Pulse: 60  Temp: 97 F (36.1 C)  Weight: 178 lb 3.2 oz (80.831 kg)    Objective:     General:  alert, cooperative and no distress   Breasts:  deferred, no complaints  Lungs: clear to auscultation bilaterally  Heart:  regular rate and rhythm  Abdomen: soft, nontender   Vulva: normal  Vagina: normal vagina  Cervix:  closed  Corpus: Well-involuted  Adnexa:  Non-palpable  Rectal Exam: no hemorrhoids        Assessment:   normal postpartum exam 4 wks s/p SVD Depression screening Contraception counseling   Plan:   Contraception: Nexplanon Follow up in: 1 year for annual exam or as needed.   PROCEDURE NOTE: Deborah Mahoney is a 23 y.o. year old African American female here for Nexplanon insertion.  She is postpartum and denies sexual intercourse, and her pregnancy test today was negative.  Risks/benefits/side effects of Nexplanon have been discussed and her questions have been  answered.  Specifically, a failure rate of 07/998 has been reported, with an increased failure rate if pt takes St. John's Wort and/or antiseizure medicaitons.  Deborah Mahoney is aware of the common side effect of irregular bleeding, which the incidence of decreases over time.  Her left arm, approximatly 4 inches proximal from the elbow, was cleansed with alcohol and anesthetized with 2cc of 2% Lidocaine.  The area was cleansed again and the Nexplanon was inserted without difficulty.  A pressure bandage was applied.  Pt was instructed to remove pressure bandage in a few hours, and keep insertion site covered with a bandaid for 3 days.  Back up contraception was recommended for 2 weeks.  Follow-up scheduled PRN problems  Vale Haven, MD  12/05/2013 3:02 PM

## 2013-12-05 NOTE — Patient Instructions (Signed)
Etonogestrel implant What is this medicine? ETONOGESTREL (et oh noe JES trel) is a contraceptive (birth control) device. It is used to prevent pregnancy. It can be used for up to 3 years. This medicine may be used for other purposes; ask your health care provider or pharmacist if you have questions. COMMON BRAND NAME(S): Implanon, Nexplanon  What should I tell my health care provider before I take this medicine? They need to know if you have any of these conditions: -abnormal vaginal bleeding -blood vessel disease or blood clots -cancer of the breast, cervix, or liver -depression -diabetes -gallbladder disease -headaches -heart disease or recent heart attack -high blood pressure -high cholesterol -kidney disease -liver disease -renal disease -seizures -tobacco smoker -an unusual or allergic reaction to etonogestrel, other hormones, anesthetics or antiseptics, medicines, foods, dyes, or preservatives -pregnant or trying to get pregnant -breast-feeding How should I use this medicine? This device is inserted just under the skin on the inner side of your upper arm by a health care professional. Talk to your pediatrician regarding the use of this medicine in children. Special care may be needed. Overdosage: If you think you've taken too much of this medicine contact a poison control center or emergency room at once. Overdosage: If you think you have taken too much of this medicine contact a poison control center or emergency room at once. NOTE: This medicine is only for you. Do not share this medicine with others. What if I miss a dose? This does not apply. What may interact with this medicine? Do not take this medicine with any of the following medications: -amprenavir -bosentan -fosamprenavir This medicine may also interact with the following medications: -barbiturate medicines for inducing sleep or treating seizures -certain medicines for fungal infections like ketoconazole and  itraconazole -griseofulvin -medicines to treat seizures like carbamazepine, felbamate, oxcarbazepine, phenytoin, topiramate -modafinil -phenylbutazone -rifampin -some medicines to treat HIV infection like atazanavir, indinavir, lopinavir, nelfinavir, tipranavir, ritonavir -St. John's wort This list may not describe all possible interactions. Give your health care provider a list of all the medicines, herbs, non-prescription drugs, or dietary supplements you use. Also tell them if you smoke, drink alcohol, or use illegal drugs. Some items may interact with your medicine. What should I watch for while using this medicine? This product does not protect you against HIV infection (AIDS) or other sexually transmitted diseases. You should be able to feel the implant by pressing your fingertips over the skin where it was inserted. Tell your doctor if you cannot feel the implant. What side effects may I notice from receiving this medicine? Side effects that you should report to your doctor or health care professional as soon as possible: -allergic reactions like skin rash, itching or hives, swelling of the face, lips, or tongue -breast lumps -changes in vision -confusion, trouble speaking or understanding -dark urine -depressed mood -general ill feeling or flu-like symptoms -light-colored stools -loss of appetite, nausea -right upper belly pain -severe headaches -severe pain, swelling, or tenderness in the abdomen -shortness of breath, chest pain, swelling in a leg -signs of pregnancy -sudden numbness or weakness of the face, arm or leg -trouble walking, dizziness, loss of balance or coordination -unusual vaginal bleeding, discharge -unusually weak or tired -yellowing of the eyes or skin Side effects that usually do not require medical attention (Report these to your doctor or health care professional if they continue or are bothersome.): -acne -breast pain -changes in  weight -cough -fever or chills -headache -irregular menstrual bleeding -itching, burning,   and vaginal discharge -pain or difficulty passing urine -sore throat This list may not describe all possible side effects. Call your doctor for medical advice about side effects. You may report side effects to FDA at 1-800-FDA-1088. Where should I keep my medicine? This drug is given in a hospital or clinic and will not be stored at home. NOTE: This sheet is a summary. It may not cover all possible information. If you have questions about this medicine, talk to your doctor, pharmacist, or health care provider.  2014, [employer]. (2012-01-04 15:37:45)  

## 2014-03-06 ENCOUNTER — Encounter: Payer: Self-pay | Admitting: General Practice

## 2014-04-18 ENCOUNTER — Encounter (HOSPITAL_COMMUNITY): Payer: Self-pay | Admitting: Emergency Medicine

## 2014-04-18 ENCOUNTER — Emergency Department (HOSPITAL_COMMUNITY)
Admission: EM | Admit: 2014-04-18 | Discharge: 2014-04-18 | Disposition: A | Discharge status: Home / Self Care | Payer: No Typology Code available for payment source | Attending: Emergency Medicine | Admitting: Emergency Medicine

## 2014-04-18 DIAGNOSIS — Z87440 Personal history of urinary (tract) infections: Secondary | ICD-10-CM | POA: Insufficient documentation

## 2014-04-18 DIAGNOSIS — K904 Malabsorption due to intolerance, not elsewhere classified: Secondary | ICD-10-CM | POA: Insufficient documentation

## 2014-04-18 DIAGNOSIS — Z3202 Encounter for pregnancy test, result negative: Secondary | ICD-10-CM | POA: Insufficient documentation

## 2014-04-18 DIAGNOSIS — Z79899 Other long term (current) drug therapy: Secondary | ICD-10-CM | POA: Insufficient documentation

## 2014-04-18 DIAGNOSIS — Z862 Personal history of diseases of the blood and blood-forming organs and certain disorders involving the immune mechanism: Secondary | ICD-10-CM | POA: Diagnosis not present

## 2014-04-18 DIAGNOSIS — K9049 Malabsorption due to intolerance, not elsewhere classified: Secondary | ICD-10-CM

## 2014-04-18 DIAGNOSIS — Z87891 Personal history of nicotine dependence: Secondary | ICD-10-CM | POA: Diagnosis not present

## 2014-04-18 DIAGNOSIS — R1084 Generalized abdominal pain: Secondary | ICD-10-CM | POA: Diagnosis present

## 2014-04-18 LAB — CBC WITH DIFFERENTIAL/PLATELET
BASOS ABS: 0 10*3/uL (ref 0.0–0.1)
Basophils Relative: 0 % (ref 0–1)
Eosinophils Absolute: 0 10*3/uL (ref 0.0–0.7)
Eosinophils Relative: 0 % (ref 0–5)
HCT: 37.7 % (ref 36.0–46.0)
HEMOGLOBIN: 12.4 g/dL (ref 12.0–15.0)
LYMPHS ABS: 1.2 10*3/uL (ref 0.7–4.0)
LYMPHS PCT: 13 % (ref 12–46)
MCH: 27.6 pg (ref 26.0–34.0)
MCHC: 32.9 g/dL (ref 30.0–36.0)
MCV: 84 fL (ref 78.0–100.0)
MONO ABS: 0.5 10*3/uL (ref 0.1–1.0)
MONOS PCT: 5 % (ref 3–12)
Neutro Abs: 7.6 10*3/uL (ref 1.7–7.7)
Neutrophils Relative %: 82 % — ABNORMAL HIGH (ref 43–77)
Platelets: 269 10*3/uL (ref 150–400)
RBC: 4.49 MIL/uL (ref 3.87–5.11)
RDW: 13 % (ref 11.5–15.5)
WBC: 9.4 10*3/uL (ref 4.0–10.5)

## 2014-04-18 LAB — COMPREHENSIVE METABOLIC PANEL
ALT: 20 U/L (ref 0–35)
AST: 21 U/L (ref 0–37)
Albumin: 3.9 g/dL (ref 3.5–5.2)
Alkaline Phosphatase: 62 U/L (ref 39–117)
Anion gap: 11 (ref 5–15)
BILIRUBIN TOTAL: 0.3 mg/dL (ref 0.3–1.2)
BUN: 14 mg/dL (ref 6–23)
CO2: 24 meq/L (ref 19–32)
CREATININE: 0.73 mg/dL (ref 0.50–1.10)
Calcium: 9.5 mg/dL (ref 8.4–10.5)
Chloride: 103 mEq/L (ref 96–112)
GLUCOSE: 103 mg/dL — AB (ref 70–99)
Potassium: 4.3 mEq/L (ref 3.7–5.3)
Sodium: 138 mEq/L (ref 137–147)
Total Protein: 7.7 g/dL (ref 6.0–8.3)

## 2014-04-18 LAB — URINALYSIS, ROUTINE W REFLEX MICROSCOPIC
BILIRUBIN URINE: NEGATIVE
Glucose, UA: NEGATIVE mg/dL
KETONES UR: NEGATIVE mg/dL
LEUKOCYTES UA: NEGATIVE
Nitrite: NEGATIVE
Protein, ur: NEGATIVE mg/dL
Specific Gravity, Urine: 1.023 (ref 1.005–1.030)
Urobilinogen, UA: 0.2 mg/dL (ref 0.0–1.0)
pH: 5 (ref 5.0–8.0)

## 2014-04-18 LAB — LIPASE, BLOOD: LIPASE: 30 U/L (ref 11–59)

## 2014-04-18 LAB — URINE MICROSCOPIC-ADD ON

## 2014-04-18 LAB — POC URINE PREG, ED: Preg Test, Ur: NEGATIVE

## 2014-04-18 MED ORDER — LACTASE 3000 UNITS PO TABS: ORAL_TABLET | ORAL | Status: DC

## 2014-04-18 MED ORDER — SODIUM CHLORIDE 0.9 % IV BOLUS (SEPSIS)
1000.0000 mL | Freq: Once | INTRAVENOUS | Status: AC
  Administered 2014-04-18: 1000 mL via INTRAVENOUS

## 2014-04-18 NOTE — ED Provider Notes (Signed)
CSN: [id number]     Arrival date & time 04/18/14  [id number] History   First MD Initiated Contact with Patient 04/18/14 [id number]     Chief Complaint  Patient presents with  . Abdominal Pain     (Consider location/radiation/quality/duration/timing/severity/associated sxs/prior Treatment) HPI Comments: Deborah Mahoney is a 23 y.o. Female with a PMHx of anemia, recurrent UTIs, and milk intolerance, who presents to the ED with complaints of 8/10 intermittent generalized nonradiating crampy abd pain x4 hrs worse with standing and without alleviating factors given that she hasn't tried anything for this. Pt reports that last night at 11pm she had milk with cereal prior to going to bed, and that typically she does not tolerate milk well but ate this anyway. Awoke at 4am and got ready for work, and upon arriving at work she developed an episode of crampy pain which subsided sporadically prior to arrival, but prompted her to come in because she was concerned that she wouldn't be able to eat today due to feelings of needing to have a BM, although denies any diarrhea today. Had an episode of watery diarrhea 2 days ago after milk, but subsided on it's own. Denies fevers, chills, CP, SOB, cough, n/v/d/c, hematochezia, melena, hematuria, dysuria, malodorous urine, vaginal discharge/bleeding, myalgias, arthralgias, flank pain, or rashes. States her LMP was spotting 1 month ago, has had light periods since having Nexplanon placed 4 months ago. Denies EtOH, recent travel, recent abx, sick contacts, or suspicious food intake aside from milk last night. No prior abd surgeries, continues to pass flatus, no abd distension reported.  Patient is a 23 y.o. female presenting with abdominal pain. The history is provided by the patient. No language interpreter was used.  Abdominal Pain Pain location:  Generalized Pain quality: cramping   Pain radiates to:  Does not radiate Pain severity:  Mild (8/10) Onset quality:  Sudden Duration:   3 hours Timing:  Intermittent Progression:  Resolved Chronicity:  Recurrent Context: eating (milk intake)   Relieved by:  None tried Worsened by:  Movement (standing) Ineffective treatments:  None tried Associated symptoms: diarrhea (2 days ago watery diarrhea but now resolved)   Associated symptoms: no anorexia, no belching, no chest pain, no chills, no constipation, no cough, no dysuria, no fever, no flatus, no hematemesis, no hematochezia, no hematuria, no melena, no nausea, no shortness of breath, no vaginal bleeding, no vaginal discharge and no vomiting   Risk factors: has not had multiple surgeries and not pregnant     Past Medical History  Diagnosis Date  . Seasonal allergies   . Anemia   . Infection     UTI   Past Surgical History  Procedure Laterality Date  . No past surgeries     Family History  Problem Relation Age of Onset  . Hypertension Mother   . Hyperlipidemia Father   . Hypertension Father   . Hearing loss Neg Hx    History  Substance Use Topics  . Smoking status: Former Games developermoker  . Smokeless tobacco: Never Used  . Alcohol Use: Yes     Comment: occ stoppped with pregnancy   OB History   Grav Para Term Preterm Abortions TAB SAB Ect Mult Living   1 1 1       1      Review of Systems  Constitutional: Negative for fever, chills and diaphoresis.  Respiratory: Negative for cough and shortness of breath.   Cardiovascular: Negative for chest pain.  Gastrointestinal: Positive for abdominal pain and  diarrhea (2 days ago watery diarrhea but now resolved). Negative for nausea, vomiting, constipation, blood in stool, melena, hematochezia, abdominal distention, anorexia, flatus and hematemesis.  Genitourinary: Negative for dysuria, urgency, frequency, hematuria, flank pain, decreased urine volume, vaginal bleeding, vaginal discharge and pelvic pain.  Musculoskeletal: Negative for arthralgias, back pain and myalgias.  Skin: Negative for rash.  Neurological: Negative  for dizziness and light-headedness.   10 Systems reviewed and are negative for acute change except as noted in the HPI.    Allergies  Review of patient's allergies indicates no known allergies.  Home Medications   Prior to Admission medications   Medication Sig Start Date End Date Taking? Authorizing Provider  ibuprofen (ADVIL,MOTRIN) 600 MG tablet Take 1 tablet (600 mg total) by mouth every 6 (six) hours. 11/05/13   Saralyn Pilar, DO  ranitidine (ZANTAC) 150 MG tablet Take 150 mg by mouth daily.    Historical Provider, MD   BP 110/65  Pulse 66  Temp(Src) 97.6 F (36.4 C) (Oral)  Resp 18  SpO2 99% Physical Exam  Nursing note and vitals reviewed. Constitutional: She is oriented to person, place, and time. Vital signs are normal. She appears well-developed and well-nourished.  Non-toxic appearance. No distress.  Afebrile, nontoxic, talking on the phone  HENT:  Head: Normocephalic and atraumatic.  Mouth/Throat: Mucous membranes are normal.  Eyes: Conjunctivae and EOM are normal. Right eye exhibits no discharge. Left eye exhibits no discharge.  Neck: Normal range of motion. Neck supple.  Cardiovascular: Normal rate, regular rhythm, normal heart sounds and intact distal pulses.   No murmur heard. Pulmonary/Chest: Effort normal and breath sounds normal. No respiratory distress. She has no decreased breath sounds. She has no wheezes. She has no rhonchi. She has no rales.  Abdominal: Soft. Normal appearance and bowel sounds are normal. She exhibits no distension. There is tenderness (trace) in the suprapubic area. There is no rigidity, no rebound, no guarding, no CVA tenderness, no tenderness at McBurney's point and negative Murphy's sign.    Soft, nondistended, +BS throughout, no r/g/r, neg murphy's, neg mcburney's, very minimal tenderness in suprapubic area but does not appear to elicit significant pain, no CVA TTP  Musculoskeletal: Normal range of motion.  Neurological: She  is alert and oriented to person, place, and time.  Skin: Skin is warm, dry and intact. No rash noted.  Psychiatric: She has a normal mood and affect.    ED Course  Procedures (including critical care time) Labs Review Labs Reviewed  CBC WITH DIFFERENTIAL - Abnormal; Notable for the following:    Neutrophils Relative % 82 (*)    All other components within normal limits  COMPREHENSIVE METABOLIC PANEL - Abnormal; Notable for the following:    Glucose, Bld 103 (*)    All other components within normal limits  URINALYSIS, ROUTINE W REFLEX MICROSCOPIC - Abnormal; Notable for the following:    Hgb urine dipstick TRACE (*)    All other components within normal limits  LIPASE, BLOOD  URINE MICROSCOPIC-ADD ON  POC URINE PREG, ED    Imaging Review No results found.   EKG Interpretation None      MDM   Final diagnoses:  Abdominal cramping, generalized  Milk intolerance    23y/o female with generalized abd cramping after eating milk when pt has known milk intolerance. Nonperitoneal abd exam, afebrile and nontoxic, NAD. Doubt emergent pathology requiring imaging. Will obtain basic labs and give fluids since pt states she hasn't eaten anything today, and will also PO  challenge her now (no nausea, declined pain and nausea meds while here). Likely related to milk intolerance. Discussed importance of avoidance of milk, or alternatives including soy milk and lactaid. Will reassess shortly.  9:41 AM Upreg neg, U/A with trace Hgb but 0-2 RBC, unlikely significant, and doubt kidney stones given that pt lacks flank pain or focal source of pain. CBC w/diff unremarkable, CMP unremarkable, lipase neg. Will discharge home now, discussed tylenol/motrin and lactaid use for pain/diarrhea, and have her f/up with PCP. Discussed diet modifications for milk intolerance. I explained the diagnosis and have given explicit precautions to return to the ER including for any other new or worsening symptoms. The  patient understands and accepts the medical plan as it's been dictated and I have answered their questions. Discharge instructions concerning home care and prescriptions have been given. The patient is STABLE and is discharged to home in good condition.  BP 110/65  Pulse 66  Temp(Src) 97.6 F (36.4 C) (Oral)  Resp 18  SpO2 99%  Meds ordered this encounter  Medications  . sodium chloride 0.9 % bolus 1,000 mL    Sig:   . lactase (LACTAID) 3000 UNITS tablet    Sig: Take as directed by the directions on the bottle.    Dispense:  60 tablet    Refill:  0    Order Specific Question:  Supervising Provider    Answer:  Vida Roller [address] [place], PA-C 04/18/14 [phone]

## 2014-04-18 NOTE — ED Notes (Signed)
Middle abd pain nausea and diarrhea ,no  Vomiting ,dysuria or vag d/c has not any OTC meds to help at all, has implant bc

## 2014-04-18 NOTE — ED Notes (Signed)
Per pt sts 2 days of intermittent abdominal cramping more after she drinks milk. sts also body aches and cold sweats.

## 2014-04-18 NOTE — ED Notes (Signed)
Pt gtiven ginger ale

## 2014-04-18 NOTE — Discharge Instructions (Signed)
Use lactaid as prescribed, as needed when eating milk products. Stay well hydrated with small sips of fluids throughout the day. Use over-the-counter tylenol or motrin for pain, but most important is avoiding known triggers. Follow a BRAT (banana-rice-applesauce-toast) diet as described below for the next 24-48 hours to avoid any worsening diarrhea or abdominal pain symptoms. The 'BRAT' diet is suggested, then progress to diet as tolerated as symptoms abate. Call if bloody stools, persistent diarrhea, vomiting, fever or abdominal pain. Return to ER for changing or worsening of symptoms.  Food Choices to Help Relieve Diarrhea When you have diarrhea, the foods you eat and your eating habits are very important. Choosing the right foods and drinks can help relieve diarrhea. Also, because diarrhea can last up to 7 days, you need to replace lost fluids and electrolytes (such as sodium, potassium, and chloride) in order to help prevent dehydration.  WHAT GENERAL GUIDELINES DO I NEED TO FOLLOW?  Slowly drink 1 cup (8 oz) of fluid for each episode of diarrhea. If you are getting enough fluid, your urine will be clear or pale yellow.  Eat starchy foods. Some good choices include white rice, white toast, pasta, low-fiber cereal, baked potatoes (without the skin), saltine crackers, and bagels.  Avoid large servings of any cooked vegetables.  Limit fruit to two servings per day. A serving is  cup or 1 small piece.  Choose foods with less than 2 g of fiber per serving.  Limit fats to less than 8 tsp (38 g) per day.  Avoid fried foods.  Eat foods that have probiotics in them. Probiotics can be found in certain dairy products.  Avoid foods and beverages that may increase the speed at which food moves through the stomach and intestines (gastrointestinal tract). Things to avoid include:  High-fiber foods, such as dried fruit, raw fruits and vegetables, nuts, seeds, and whole grain foods.  Spicy foods and  high-fat foods.  Foods and beverages sweetened with high-fructose corn syrup, honey, or sugar alcohols such as xylitol, sorbitol, and mannitol. WHAT FOODS ARE RECOMMENDED? Grains White rice. White, [place], or pita breads (fresh or toasted), including plain rolls, buns, or bagels. White pasta. Saltine, soda, or graham crackers. Pretzels. Low-fiber cereal. Cooked cereals made with water (such as cornmeal, farina, or cream cereals). Plain muffins. Matzo. Melba toast. Zwieback.  Vegetables Potatoes (without the skin). Strained tomato and vegetable juices. Most well-cooked and canned vegetables without seeds. Tender lettuce. Fruits Cooked or canned applesauce, apricots, cherries, fruit cocktail, grapefruit, peaches, pears, or plums. Fresh bananas, apples without skin, cherries, grapes, cantaloupe, grapefruit, peaches, oranges, or plums.  Meat and Other Protein Products Baked or boiled chicken. Eggs. Tofu. Fish. Seafood. Smooth peanut butter. Ground or well-cooked tender beef, ham, veal, lamb, pork, or poultry.  Dairy Plain yogurt, kefir, and unsweetened liquid yogurt. Lactose-free milk, buttermilk, or soy milk. Plain hard cheese. Beverages Sport drinks. Clear broths. Diluted fruit juices (except prune). Regular, caffeine-free sodas such as ginger ale. Water. Decaffeinated teas. Oral rehydration solutions. Sugar-free beverages not sweetened with sugar alcohols. Other Bouillon, broth, or soups made from recommended foods.  The items listed above may not be a complete list of recommended foods or beverages. Contact your dietitian for more options. WHAT FOODS ARE NOT RECOMMENDED? Grains Whole grain, whole wheat, bran, or rye breads, rolls, pastas, crackers, and cereals. Wild or brown rice. Cereals that contain more than 2 g of fiber per serving. Corn tortillas or taco shells. Cooked or dry oatmeal. Granola. Popcorn. Vegetables Raw  vegetables. Cabbage, broccoli, Brussels sprouts, artichokes, baked  beans, beet greens, corn, kale, legumes, peas, sweet potatoes, and yams. Potato skins. Cooked spinach and cabbage. Fruits Dried fruit, including raisins and dates. Raw fruits. Stewed or dried prunes. Fresh apples with skin, apricots, mangoes, pears, raspberries, and strawberries.  Meat and Other Protein Products Chunky peanut butter. Nuts and seeds. Beans and lentils. Tomasa BlaseBacon.  Dairy High-fat cheeses. Milk, chocolate milk, and beverages made with milk, such as milk shakes. Cream. Ice cream. Sweets and Desserts Sweet rolls, doughnuts, and sweet breads. Pancakes and waffles. Fats and Oils Butter. Cream sauces. Margarine. Salad oils. Plain salad dressings. Olives. Avocados.  Beverages Caffeinated beverages (such as coffee, tea, soda, or energy drinks). Alcoholic beverages. Fruit juices with pulp. Prune juice. Soft drinks sweetened with high-fructose corn syrup or sugar alcohols. Other Coconut. Hot sauce. Chili powder. Mayonnaise. Gravy. Cream-based or milk-based soups.  The items listed above may not be a complete list of foods and beverages to avoid. Contact your dietitian for more information. WHAT SHOULD I DO IF I BECOME DEHYDRATED? Diarrhea can sometimes lead to dehydration. Signs of dehydration include dark urine and dry mouth and skin. If you think you are dehydrated, you should rehydrate with an oral rehydration solution. These solutions can be purchased at pharmacies, retail stores, or online.  Drink -1 cup (120-240 mL) of oral rehydration solution each time you have an episode of diarrhea. If drinking this amount makes your diarrhea worse, try drinking smaller amounts more often. For example, drink 1-3 tsp (5-15 mL) every 5-10 minutes.  A general rule for staying hydrated is to drink 1-2 L of fluid per day. Talk to your health care provider about the specific amount you should be drinking each day. Drink enough fluids to keep your urine clear or pale yellow. Document Released: 09/19/2003  Document Revised: 07/04/2013 Document Reviewed: 05/22/2013 [facility] Patient Information 2015 [place], [place]. This information is not intended to replace advice given to you by your health care provider. Make sure you discuss any questions you have with your health care provider.   Abdominal Pain, Women Abdominal (stomach, pelvic, or belly) pain can be caused by many things. It is important to tell your doctor:  The location of the pain.  Does it come and go or is it present all the time?  Are there things that start the pain (eating certain foods, exercise)?  Are there other symptoms associated with the pain (fever, nausea, vomiting, diarrhea)? All of this is helpful to know when trying to find the cause of the pain. CAUSES   Stomach: virus or bacteria infection, or ulcer.  Intestine: appendicitis (inflamed appendix), regional ileitis (Crohn's disease), ulcerative colitis (inflamed colon), irritable bowel syndrome, diverticulitis (inflamed diverticulum of the colon), or cancer of the stomach or intestine.  Gallbladder disease or stones in the gallbladder.  Kidney disease, kidney stones, or infection.  Pancreas infection or cancer.  Fibromyalgia (pain disorder).  Diseases of the female organs:  Uterus: fibroid (non-cancerous) tumors or infection.  Fallopian tubes: infection or tubal pregnancy.  Ovary: cysts or tumors.  Pelvic adhesions (scar tissue).  Endometriosis (uterus lining tissue growing in the pelvis and on the pelvic organs).  Pelvic congestion syndrome (female organs filling up with blood just before the menstrual period).  Pain with the menstrual period.  Pain with ovulation (producing an egg).  Pain with an IUD (intrauterine device, birth control) in the uterus.  Cancer of the female organs.  Functional pain (pain not caused by a disease,  may improve without treatment).  Psychological pain.  Depression. DIAGNOSIS  Your doctor will decide the  seriousness of your pain by doing an examination.  Blood tests.  X-rays.  Ultrasound.  CT scan (computed tomography, special type of X-ray).  MRI (magnetic resonance imaging).  Cultures, for infection.  Barium enema (dye inserted in the large intestine, to better view it with X-rays).  Colonoscopy (looking in intestine with a lighted tube).  Laparoscopy (minor surgery, looking in abdomen with a lighted tube).  Major abdominal exploratory surgery (looking in abdomen with a large incision). TREATMENT  The treatment will depend on the cause of the pain.   Many cases can be observed and treated at home.  Over-the-counter medicines recommended by your caregiver.  Prescription medicine.  Antibiotics, for infection.  Birth control pills, for painful periods or for ovulation pain.  Hormone treatment, for endometriosis.  Nerve blocking injections.  Physical therapy.  Antidepressants.  Counseling with a psychologist or psychiatrist.  Minor or major surgery. HOME CARE INSTRUCTIONS   Do not take laxatives, unless directed by your caregiver.  Take over-the-counter pain medicine only if ordered by your caregiver. Do not take aspirin because it can cause an upset stomach or bleeding.  Try a clear liquid diet (broth or water) as ordered by your caregiver. Slowly move to a bland diet, as tolerated, if the pain is related to the stomach or intestine.  Have a thermometer and take your temperature several times a day, and record it.  Bed rest and sleep, if it helps the pain.  Avoid sexual intercourse, if it causes pain.  Avoid stressful situations.  Keep your follow-up appointments and tests, as your caregiver orders.  If the pain does not go away with medicine or surgery, you may try:  Acupuncture.  Relaxation exercises (yoga, meditation).  Group therapy.  Counseling. SEEK MEDICAL CARE IF:   You notice certain foods cause stomach pain.  Your home care  treatment is not helping your pain.  You need stronger pain medicine.  You want your IUD removed.  You feel faint or lightheaded.  You develop nausea and vomiting.  You develop a rash.  You are having side effects or an allergy to your medicine. SEEK IMMEDIATE MEDICAL CARE IF:   Your pain does not go away or gets worse.  You have a fever.  Your pain is felt only in portions of the abdomen. The right side could possibly be appendicitis. The left lower portion of the abdomen could be colitis or diverticulitis.  You are passing blood in your stools (bright red or black tarry stools, with or without vomiting).  You have blood in your urine.  You develop chills, with or without a fever.  You pass out. MAKE SURE YOU:   Understand these instructions.  Will watch your condition.  Will get help right away if you are not doing well or get worse. Document Released: 04/26/2007 Document Revised: 11/13/2013 Document Reviewed: 05/16/2009 [facility] Patient Information 2015 [place], [place]. This information is not intended to replace advice given to you by your health care provider. Make sure you discuss any questions you have with your health care provider.  Lactose Intolerance, Adult Lactose intolerance is when the body is not able to digest lactose, a sugar found in milk and milk products. Lactose intolerance is caused by your body not producing enough of the enzyme lactase. When there is not enough lactase to digest the amount of lactose consumed, discomfort may be felt. Lactose intolerance is  not a milk allergy. For most people, lactase deficiency is a condition that develops naturally over time. After about the age of 2, the body begins to produce less lactase. But many people may not experience symptoms until they are much older. CAUSES Things that can cause you to be lactose intolerant include:  Aging.  Being born without the ability to make lactase.  Certain digestive  diseases.  Injuries to the small intestine. SYMPTOMS   Feeling sick to your stomach (nauseous).  Diarrhea.  Cramps.  Bloating.  Gas. Symptoms usually show up a half hour or 2 hours after eating or drinking products containing lactose. TREATMENT  No treatment can improve the body's ability to produce lactase. However, symptoms can be controlled through diet. A medicine may be given to you to take when you consume lactose-containing foods or drinks. The medicine contains the lactase enzyme, which help the body digest lactose better. HOME CARE INSTRUCTIONS  Eat or drink dairy products as told by your caregiver or dietician.  Take all medicine as directed by your caregiver.  Find lactose-free or lactose-reduced products at your local grocery store.  Talk to your caregiver or dietician to decide if you need any dietary supplements. The following is the amount of calcium needed from the diet:  19 to 50 years: 1000 mg  Over 50 years: 1200 mg Calcium and Lactose in Common Foods Non-Dairy Products / Calcium Content (mg)  Calcium-fortified orange juice, 1 cup / 308 to 344 mg  Sardines, with edible bones, 3 oz / 270 mg  Salmon, canned, with edible bones, 3 oz / 205 mg  Soymilk, fortified, 1 cup / 200 mg  Broccoli (raw), 1 cup / 90 mg  Orange, 1 medium / 50 mg  Pinto beans,  cup / 40 mg  Tuna, canned, 3 oz / 10 mg  Lettuce greens,  cup / 10 mg Dairy Products / Calcium Content (mg) / Lactose Content (g)  Yogurt, plain, low-fat, 1 cup / 415 mg / 5 g  Milk, reduced fat, 1 cup / 295 mg / 11 g  Swiss cheese, 1 oz / 270 mg / 1 g  Ice cream,  cup / 85 mg / 6 g  Cottage cheese,  cup / 75 mg / 2 to 3 g SEEK MEDICAL CARE IF: You have no relief from your symptoms. Document Released: 06/29/2005 Document Revised: 09/21/2011 Document Reviewed: 09/29/2013 [facility] Patient Information 2015 [place], [place]. This information is not intended to replace advice given to you by  your health care provider. Make sure you discuss any questions you have with your health care provider.  Diet for Lactose Intolerance Lactose intolerance is when the body is not able to digest lactose, a sugar found in milk and milk products. Children with lactose intolerance should avoid consuming foods and drinks with lactose.  WHAT DO I NEED TO KNOW ABOUT THIS DIET?  Avoid giving your child foods with lactose.  Look for the words "lactose-free" or "lactose-reduced" on food labels. Your child can have lactose-free foods and may be able to have small amounts of lactose-reduced foods.  Make sure your child gets enough calcium. Give your child a calcium supplement if directed by your child's health care provider. Talk to the health care provider about a supplement if your child is not taking one. WHICH FOODS HAVE LACTOSE? Lactose is found in milk and milk products, such as:   Yogurt.  Cheese.  Butter.  Margarine.  Sour cream.  Creamer.  Whipped topping.  Lactose is also found in foods made with milk or milk ingredients. To find out whether a food is made with milk or a milk ingredient, look at the ingredients list. Avoid foods with the statement "May contain milk" and foods that contain:   Butter.  Cream.  Milk.  Milk solids.  [place]hey. WHAT ARE SOME ALTERNATIVES TO MILK AND FOODS MADE WITH MILK PRODUCTS?  Lactose-free products, such as lactose-free milk.  Almond or rice milk.  Soy products, such as soy yogurt, soy cheese, soy ice cream, soy-based sour cream, and soy-based infant formula.  Nondairy products, such as nondairy creamers and nondairy whipped topping. Note that nondairy products sometimes contain lactose, so it is important to check the ingredients list. CAN MY CHILD HAVE ANY FOODS WITH LACTOSE? Some children with lactose intolerance can safely eat foods that have a little lactose. Foods with a little lactose have less than 1 g of lactose per serving. Examples of  foods with a little lactose are:   Aged cheese (such as Swiss, cheddar, or Parmesan cheese). One serving is about 1-2 oz.  Cream cheese. One serving is about 2 Tbsp.  Ricotta cheese. One serving is about  cup. If you give your child a food that has lactose:   Give your child only one food with lactose in it at a time.  Give your child a small amount of the food.  Stop giving your child the food if symptoms return. IS MY CHILD GETTING ENOUGH CALCIUM? Calcium is found in many foods with lactose and is important for bone health. The amount of calcium your child needs depends on his or her age:   Children 25-1 years old need about 270 mg of calcium a day.  Children 78-24 years old need about 800 mg of calcium a day.  Children 66-39 years old need about 1300 mg of calcium a day. Make sure your child gets enough calcium by taking a calcium supplement or by eating lactose-free foods that are high in calcium, such as:   Orange juice with calcium added. There are 308-344 mg of calcium in 1 cup of orange juice.  Sardines with edible bones. There are 270 mg of calcium in 3 oz of sardines.  Canned salmon with edible bones. There are 205 mg of calcium in 3 oz of canned salmon.  Soy milk with calcium added. There are 200 mg of calcium in 1 cup of soy milk.  Raw broccoli. There are 90 mg of calcium in 1 cup of raw broccoli.  Orange. There are 50 mg of calcium in 1 medium orange.  Pinto beans. There are 40 mg of calcium in  cup of pinto beans.  Canned tuna. There are 10 mg of calcium in 3 oz canned tuna.  Lettuce greens. There are 10 mg of calcium in  cup of lettuce greens. Document Released: 07/04/2013 Document Revised: 11/13/2013 Document Reviewed: 07/04/2013 [facility] Patient Information 2015 [place], [place]. This information is not intended to replace advice given to you by your health care provider. Make sure you discuss any questions you have with your health care provider.

## 2014-04-18 NOTE — ED Provider Notes (Signed)
Medical screening examination/treatment/procedure(s) were conducted as a shared visit with non-physician practitioner(s) or resident  and myself.  I personally evaluated the patient during the encounter and agree with the findings.   I have personally reviewed any xrays and/ or EKG's with the provider and I agree with interpretation.   Well-appearing patient with mild abdominal cramping no focal abdominal pain. On exam soft abdomen, nontender, no guarding, mild dry mucous membranes. Patient has had recurrent diarrhea nonbloody no recent antibiotics Discussed likely benign etiology however if patient has focal tenderness, fevers or new concerns to come back.  Abdominal cramping, diarrhea  Enid SkeensJoshua M Caelin Rosen, MD 04/18/14 1623

## 2014-04-19 ENCOUNTER — Ambulatory Visit (INDEPENDENT_AMBULATORY_CARE_PROVIDER_SITE_OTHER): Payer: No Typology Code available for payment source | Admitting: Family Medicine

## 2014-04-19 ENCOUNTER — Encounter: Payer: Self-pay | Admitting: Family Medicine

## 2014-04-19 VITALS — BP 121/77 | HR 84 | Temp 98.3°F | Wt 178.0 lb

## 2014-04-19 DIAGNOSIS — R1084 Generalized abdominal pain: Secondary | ICD-10-CM

## 2014-04-19 DIAGNOSIS — R109 Unspecified abdominal pain: Secondary | ICD-10-CM | POA: Insufficient documentation

## 2014-04-19 NOTE — Patient Instructions (Signed)
Great to meet you!  If you are not totally better in 1 week please come back .   If your symptoms worsen or you begin to have increased blood in your stool or if you cannot tolerate food or fluids by mouth then please seek medical attention in the ER or here as you feel appropriate.

## 2014-04-19 NOTE — Progress Notes (Signed)
Patient ID: Deborah Mahoney, female   DOB: 23-May-1991, 23 y.o.   MRN: [id number]   HPI  Patient presents today for abdominal pain and bloody mucus per rectum  Patient states that on Sunday, 4 days ago, she had brief abdominal cramping that became more frequent throughout the day causing her to leave work. She went home and rested and began to feel better. The following 2 days she did not need to work and felt fine on those 2 days.   1 day ago she developed intermittent abdominal cramping while at work again and had 2 episodes of stools with bloody mucus. She went to the ED and had a negative workup including negative U pregnant, normal CBC CMP lipase and urinalysis. She did have 0-2 RBCs per high-power field in her UA.  This morning she was at work and had repeated episodes of crampy abdominal pain, described as generalized, lasting for a few seconds at a time, and felt that she passed gas. When she looked in her and where she actually had bloody mucus coming out on her underwear.  She states that Advil and Tylenol have helped her abdominal cramps but they seem to return when it wears off. She describes her cramps are similar to menstrual cramps.  To the ER note she had her LMP one month ago which was described as spotting. She's had light. Since the placement of her Nexplanon.  He denies fevers, chills, decrease in by mouth tolerance, Dysuria, and diarrhea.  Smoking status noted ROS: Per HPI  Objective: BP 121/77  Pulse 84  Temp(Src) 98.3 F (36.8 C) (Oral)  Wt 178 lb (80.74 kg) Gen: NAD, alert, cooperative with exam HEENT: NCAT CV: RRR, good S1/S2, no murmur Resp: CTABL, no wheezes, non-labored Abd: SNTND, BS present, no guarding or organomegaly Rectal: Stool Hemoccult test negative, normal rectal tone, no hemorrhoids visible or palpable Neuro: Alert and oriented, No gross deficits  Assessment and plan:  Abdominal pain  brief crampy abdominal pain with bloody mucus per  rectum Given her benign exam and a normal hemoglobin yesterday, she likely has viral versus bacterial infection with gut irritation. Discussed with her possibilities of more serious etiologies and ask her to followup if not resolved in one week or less. Discussed at length reasons to seek emergency care including increase blood rectum, fever, by mouth intolerance, for pain that is not managed by oral medicines.

## 2014-04-19 NOTE — Assessment & Plan Note (Addendum)
brief crampy abdominal pain with bloody mucus per rectum Given her benign exam and a normal hemoglobin yesterday, she likely has viral versus bacterial infection with gut irritation. Discussed with her possibilities of more serious etiologies and ask her to followup if not resolved in one week or less. Discussed at length reasons to seek emergency care including increase blood rectum, fever, by mouth intolerance, for pain that is not managed by oral medicines.

## 2014-05-09 ENCOUNTER — Ambulatory Visit: Payer: Medicaid Other

## 2014-05-10 ENCOUNTER — Ambulatory Visit: Payer: No Typology Code available for payment source

## 2014-05-14 ENCOUNTER — Encounter: Payer: Self-pay | Admitting: Family Medicine

## 2014-06-18 ENCOUNTER — Emergency Department (HOSPITAL_COMMUNITY): Payer: No Typology Code available for payment source

## 2014-06-18 ENCOUNTER — Emergency Department (HOSPITAL_COMMUNITY)
Admission: EM | Admit: 2014-06-18 | Discharge: 2014-06-18 | Disposition: A | Discharge status: Home / Self Care | Payer: No Typology Code available for payment source | Attending: Emergency Medicine | Admitting: Emergency Medicine

## 2014-06-18 ENCOUNTER — Encounter (HOSPITAL_COMMUNITY): Payer: Self-pay | Admitting: Family Medicine

## 2014-06-18 DIAGNOSIS — S299XXA Unspecified injury of thorax, initial encounter: Secondary | ICD-10-CM | POA: Diagnosis not present

## 2014-06-18 DIAGNOSIS — R079 Chest pain, unspecified: Secondary | ICD-10-CM

## 2014-06-18 DIAGNOSIS — S199XXA Unspecified injury of neck, initial encounter: Secondary | ICD-10-CM | POA: Diagnosis not present

## 2014-06-18 DIAGNOSIS — Y998 Other external cause status: Secondary | ICD-10-CM | POA: Diagnosis not present

## 2014-06-18 DIAGNOSIS — Y9389 Activity, other specified: Secondary | ICD-10-CM | POA: Diagnosis not present

## 2014-06-18 DIAGNOSIS — Z862 Personal history of diseases of the blood and blood-forming organs and certain disorders involving the immune mechanism: Secondary | ICD-10-CM | POA: Insufficient documentation

## 2014-06-18 DIAGNOSIS — Z87440 Personal history of urinary (tract) infections: Secondary | ICD-10-CM | POA: Insufficient documentation

## 2014-06-18 DIAGNOSIS — S3992XA Unspecified injury of lower back, initial encounter: Secondary | ICD-10-CM | POA: Insufficient documentation

## 2014-06-18 DIAGNOSIS — M7918 Myalgia, other site: Secondary | ICD-10-CM

## 2014-06-18 DIAGNOSIS — Y92410 Unspecified street and highway as the place of occurrence of the external cause: Secondary | ICD-10-CM | POA: Insufficient documentation

## 2014-06-18 MED ORDER — HYDROCODONE-ACETAMINOPHEN 5-325 MG PO TABS
1.0000 | ORAL_TABLET | Freq: Once | ORAL | Status: AC
  Administered 2014-06-18: 1 via ORAL
  Filled 2014-06-18: qty 1

## 2014-06-18 MED ORDER — KETOROLAC TROMETHAMINE 60 MG/2ML IM SOLN
30.0000 mg | Freq: Once | INTRAMUSCULAR | Status: AC
  Administered 2014-06-18: 30 mg via INTRAMUSCULAR
  Filled 2014-06-18: qty 2

## 2014-06-18 MED ORDER — HYDROCODONE-ACETAMINOPHEN 5-325 MG PO TABS: ORAL_TABLET | ORAL | Status: DC
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
## 2014-08-16 IMAGING — US US OB FOLLOW-UP
1 series · 12 of 28 positions shown · non-contrast
Comparison: none

[Series 1: us ob follow up · 76 acquisitions, 12 frames shown]
[im 3/76]
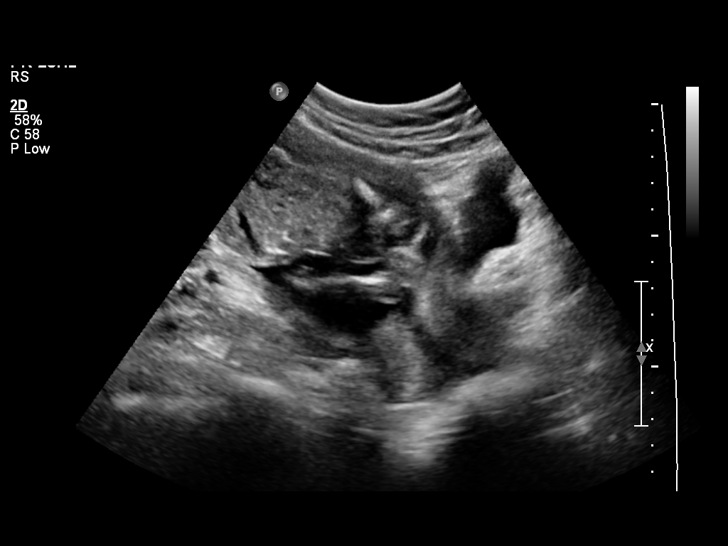
[im 9/76]
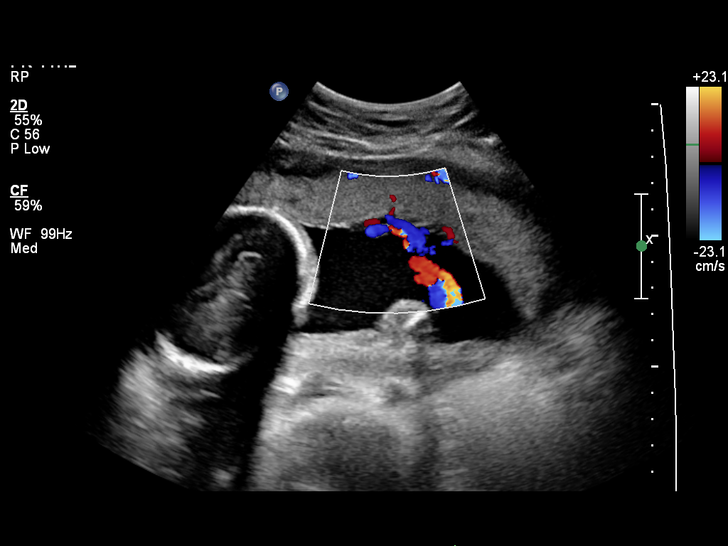
[im 14/76]
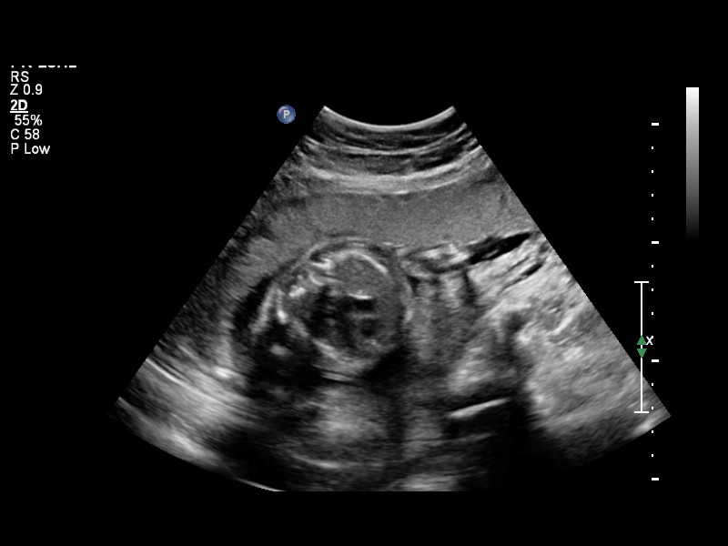
[im 23/76]
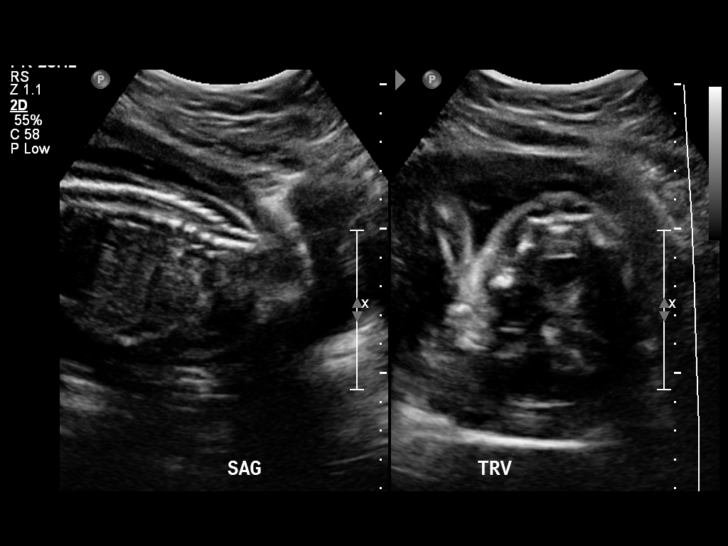
[im 28/76]
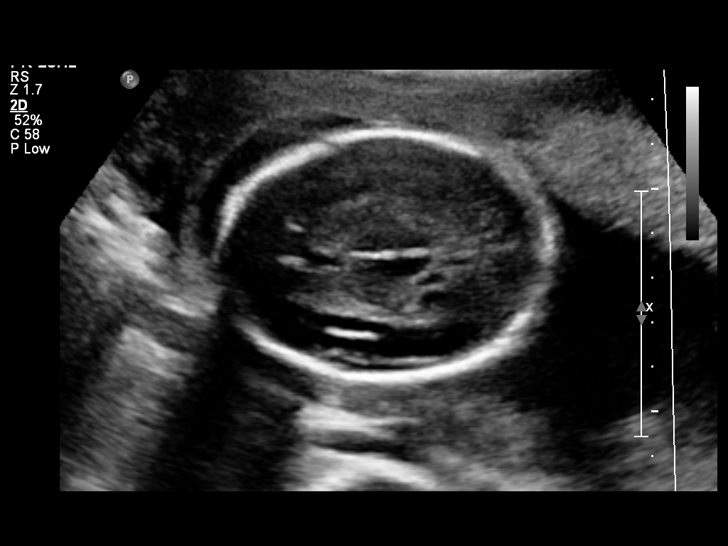
[im 34/76]
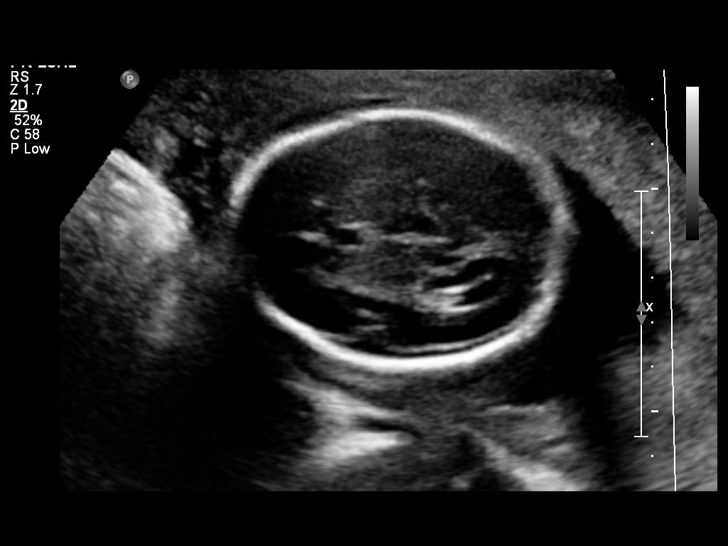
[im 42/76]
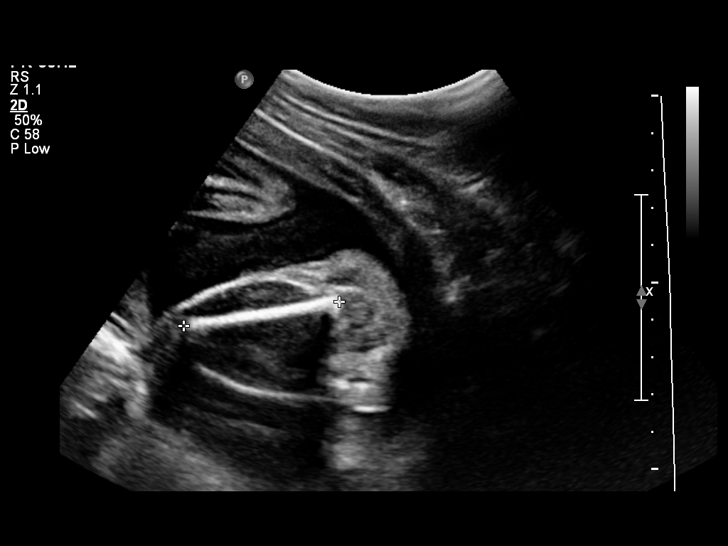
[im 48/76]
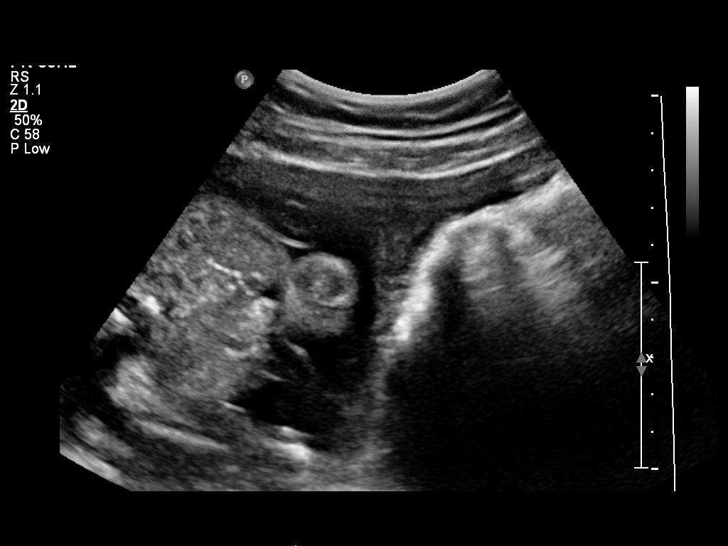
[im 53/76]
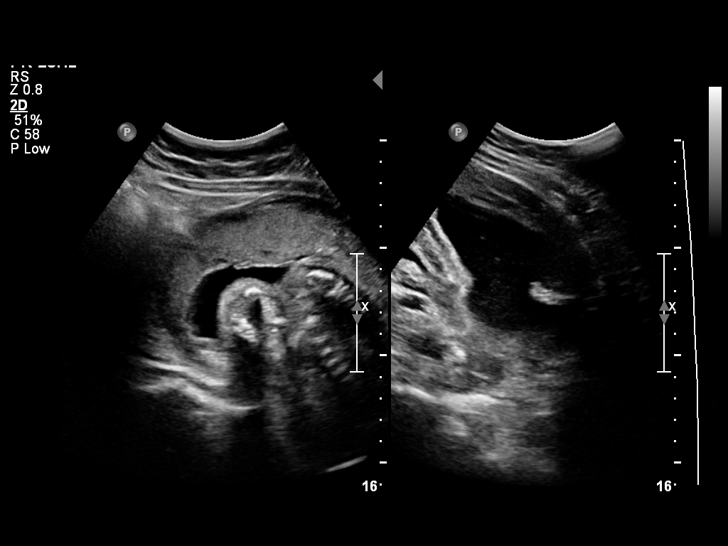
[im 62/76]
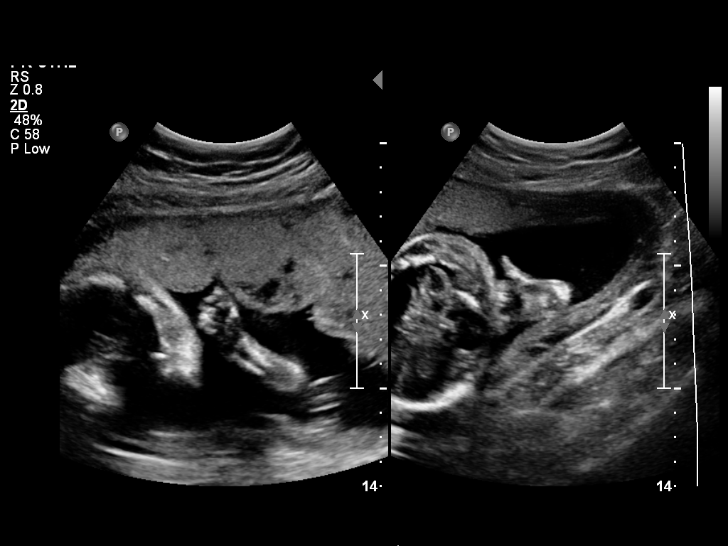
[im 67/76]
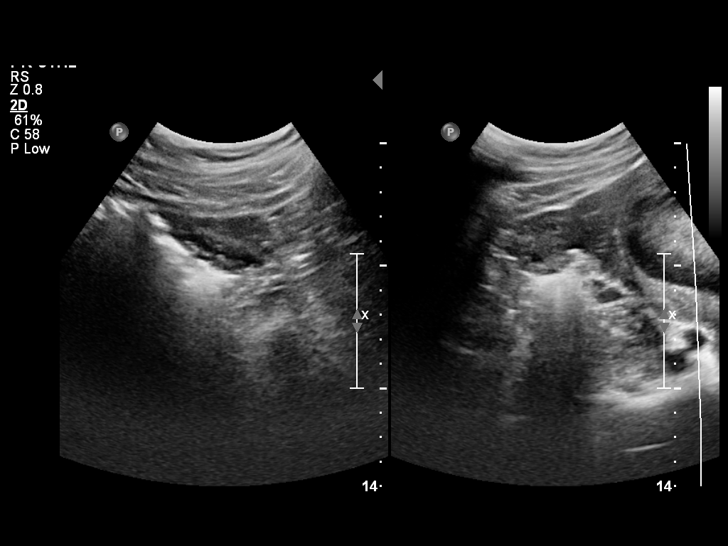
[im 73/76]
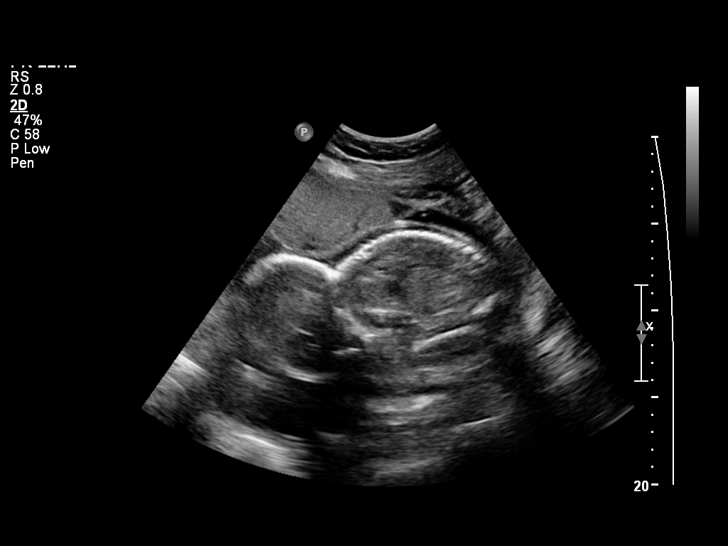

[12 of 28 positions shown; findings below may reference images not displayed]

OBSTETRICS REPORT
                      (Signed Final 07/11/2013 [DATE])

Service(s) Provided

 US OB FOLLOW UP                                       76816.1
Indications

 Follow-up incomplete fetal anatomic evaluation
Fetal Evaluation

 Num Of Fetuses:    1
 Preg. Location:    Intrauterine
 Fetal Heart Rate:  139                          bpm
 Cardiac Activity:  Observed
 Fetal Lie:         Intrauterine
 Presentation:      Breech
 Placenta:          Anterior Fundal, above
                    cervical os
 P. Cord            Visualized, central
 Insertion:

 Amniotic Fluid
 AFI FV:      Subjectively within normal limits
 AFI Sum:     14.39   cm       48  %Tile     Larg Pckt:    5.07  cm
 RUQ:   2.67    cm   RLQ:    5.07   cm    LUQ:   3.39    cm   LLQ:    3.26   cm
Biometry

 BPD:     54.1  mm     G. Age:  22w 3d                CI:         73.2   70 - 86
 OFD:     73.9  mm                                    FL/HC:      19.9   18.7 -

 HC:     206.7  mm     G. Age:  22w 5d       10  %    HC/AC:      1.13   1.05 -

 AC:     182.6  mm     G. Age:  23w 0d       27  %    FL/BPD:     76.0   71 - 87
 FL:      41.1  mm     G. Age:  23w 2d       31  %    FL/AC:      22.5   20 - 24
 HUM:     36.9  mm     G. Age:  22w 6d       27  %
 Est. FW:     565  gm      1 lb 4 oz     43  %
Gestational Age

 LMP:           30w 2d        Date:  12/11/12                 EDD:   09/17/13
 U/S Today:     22w 6d                                        EDD:   11/08/13
 Best:          23w 4d     Det. By:  Early Ultrasound         EDD:   11/03/13
                                     (05/02/13)
Anatomy

 Cranium:          Appears normal         Aortic Arch:      Appears normal
 Fetal Cavum:      Appears normal         Ductal Arch:      Appears normal
 Ventricles:       Appears normal         Diaphragm:        Appears normal
 Choroid Plexus:   Appears normal         Stomach:          Appears normal, left
                                                            sided
 Cerebellum:       Previously seen        Abdomen:          Appears normal
 Posterior Fossa:  Previously seen        Abdominal Wall:   Appears nml (cord
                                                            insert, abd wall)
 Nuchal Fold:      Previously seen        Cord Vessels:     Appears normal (3
                                                            vessel cord)
 Face:             Orbits and profile     Kidneys:          Appear normal
                   previously seen
 Lips:             Appears normal         Bladder:          Appears normal
 Heart:            Appears normal         Spine:            Appears normal
                   (4CH, axis, and
                   situs)
 RVOT:             Appears normal         Lower             Appears normal
                                          Extremities:
 LVOT:             Appears normal         Upper             Appears normal
                                          Extremities:

 Other:  Fetus appears to be a female.  Technically difficult due to fetal
         position.
Targeted Anatomy

 Fetal Central Nervous System
 Lat. Ventricles:
Cervix Uterus Adnexa

 Cervical Length:    3.09     cm

 Cervix:       Normal appearance by transabdominal scan.
 Uterus:       No abnormality visualized.
 Cul De Sac:   No free fluid seen.
 Left Ovary:    Size(cm) L: 3.29 x W: 3.24 x H: 1.8  Volume(cc): 10
 Right Ovary:   Size(cm) L: 3.76 x W: 2.57 x H: 1.86  Volume(cc):

 Adnexa:     No adnexal mass visualized.
Impression

 Single IUP at 23 [DATE] weeks
 Normal interval anatomy.  The anatomic survey is now
 complete.
 Fetal growth is appropriate (43rd %tile)
 Anterior / fundal placenta without previa
 Normal amniotic fluid volume
Recommendations

 Follow-up ultrasounds as clinically indicated.

 questions or concerns.

## 2014-09-04 ENCOUNTER — Encounter: Payer: Self-pay | Admitting: Family Medicine

## 2014-09-04 ENCOUNTER — Ambulatory Visit (INDEPENDENT_AMBULATORY_CARE_PROVIDER_SITE_OTHER): Payer: Medicaid Other | Admitting: Family Medicine

## 2014-09-04 VITALS — BP 113/69 | HR 66 | Temp 98.4°F | Wt 177.0 lb

## 2014-09-04 DIAGNOSIS — G44049 Chronic paroxysmal hemicrania, not intractable: Secondary | ICD-10-CM

## 2014-09-04 NOTE — Patient Instructions (Signed)

## 2014-09-04 NOTE — Progress Notes (Signed)
    Subjective:    Patient ID: Deborah Mahoney is a 24 y.o. female presenting with Headache  on 09/04/2014  HPI: Notes seeing black spots and headaches x 2 wks.  Increased stress. Thought it was related to her blood pressure.  Lasts 15 minutes.  Does not like taking medications.  One time took ibuprofen, but did not help. No changes. Headaches in the morning. Occurs daily to 2x/day. Resolves spontaneously. Caffeine intake approximately 2/day-colas. Notes increasing stress related to finances and recently lost her job.  Feels overly sad at times but not depressed, denies suicidality.  Sleep is normal. Has some anxiety. No OCD. Reports nausea x 1, spontaneously resolved, unrelated to headache. Headache is left side, associated vision change.  Notes pain is throbbing.  No history of migraines.   Review of Systems  Constitutional: Negative for fever and chills.  Respiratory: Negative for shortness of breath.   Cardiovascular: Negative for chest pain.  Gastrointestinal: Positive for nausea. Negative for vomiting and abdominal pain.  Genitourinary: Negative for dysuria.  Skin: Negative for rash.      Objective:    BP 113/69 mmHg  Pulse 66  Temp(Src) 98.4 F (36.9 C) (Oral)  Wt 177 lb (80.287 kg)  LMP   Breastfeeding? No Physical Exam  Constitutional: She is oriented to person, place, and time. She appears well-developed and well-nourished. No distress.  HENT:  Head: Normocephalic and atraumatic.  Eyes: EOM are normal. Pupils are equal, round, and reactive to light. No scleral icterus.  Neck: Neck supple.  Cardiovascular: Normal rate and regular rhythm.   No murmur heard. Pulmonary/Chest: Effort normal and breath sounds normal. No respiratory distress.  Abdominal: Soft. There is no tenderness.  Musculoskeletal: Normal range of motion. She exhibits no edema.  Lymphadenopathy:    She has no cervical adenopathy.  Neurological: She is alert and oriented to person, place, and time.    Skin: Skin is warm and dry.  Psychiatric: She has a normal mood and affect.        Assessment & Plan:   Chronic paroxysmal hemicrania, not intractable - not typical of migraine, or cluster or tension. Decrease caffeine, get rest, decrease anxiety.  Check eyes. No worrisome features. Return if no improvement.   Return in about 3 months (around 12/03/2014).

## 2014-11-08 ENCOUNTER — Ambulatory Visit: Payer: Medicaid Other

## 2014-11-22 ENCOUNTER — Ambulatory Visit (INDEPENDENT_AMBULATORY_CARE_PROVIDER_SITE_OTHER): Payer: Medicaid Other | Admitting: Family Medicine

## 2014-11-22 VITALS — BP 107/69 | HR 62 | Temp 98.4°F | Ht 66.5 in | Wt 182.4 lb

## 2014-11-22 DIAGNOSIS — Z3042 Encounter for surveillance of injectable contraceptive: Secondary | ICD-10-CM | POA: Diagnosis not present

## 2014-11-22 DIAGNOSIS — Z308 Encounter for other contraceptive management: Secondary | ICD-10-CM

## 2014-11-22 DIAGNOSIS — Z3046 Encounter for surveillance of implantable subdermal contraceptive: Secondary | ICD-10-CM

## 2014-11-22 MED ORDER — MEDROXYPROGESTERONE ACETATE 150 MG/ML IM SUSP
150.0000 mg | Freq: Once | INTRAMUSCULAR | Status: AC
Start: 2014-11-22 — End: 2014-11-22
  Administered 2014-11-22: 150 mg via INTRAMUSCULAR

## 2014-11-22 NOTE — Patient Instructions (Signed)
Leave the strips in place until they fall off on their own and then wash with soap and water and keep clean with a bandaid and some neosporin until it heals up.   It is normal to have some redness as it is healing but if you see any discharge, swelling or pain that looks like an infection please come back in to have it checked.

## 2014-11-22 NOTE — Progress Notes (Signed)
Patient ID: Deborah Mahoney, female   DOB: [birth year], [age] y.o.   MRN: [id number] PATIENT is here for removal of her etonogestrel rod implant (Implanon/Nexplanon). She would like it removed because of: SIDE AFFECTS, weight gain An informed consent was taken prior to removal and is to be scanned into the Electronic Health Record. Risks of the procedure include: bleeding, infection, difficulty with removal, scarring and nerve damage. There may be bruising at the site of incision and down the arm. Procedure Note: Time out taken Team: Jenel LucksEniola, Adamo, Tamika RN  Procedure: Progestin Implant Removal Procedure confirmed by patient and team YES Side: LEFT Position correct for procedure YES Equipment for procedure available YES The patient is place in the supine position. Asceptic conditions are maintained. The rod is located by palpation. The area is cleaned with antiseptic. 4 cc of 1% lidocaine without epinephrine is injected just underneath the end of the implant closest to the elbow. After firmly pressing down on the end of the implant closer to the axilla a 2-3 mm incision is made with a scalpel. The rod is pushed to the incision site and grasped with a mosquito forceps and gently removed. Blunt dissection WAS NOT needed. The patient DIDtolerate the procedure well. The 4 cm rod was removed in its entirety. The incision was dressed with a small adhesive bandage closure and a pressure dressing was applied. An alternate plan for contraception was discussed. The patient would like to use depo provera for her contraception.   Depo wad administered immediately following the procedure

## 2014-11-22 NOTE — Addendum Note (Signed)
Addended by: Clovis PuMARTIN, Malaiyah Achorn L on: 11/22/2014 02:06 PM   Modules accepted: Orders

## 2014-11-22 NOTE — Progress Notes (Signed)
   Pt in for Depo Provera injection.  Pt tolerated Depo injection. Depo given left upper outer quadrant.  Next injection due July-28-February 21, 2015.  Reminder card given. Clovis PuMartin, Tamika L, RN

## 2015-01-01 ENCOUNTER — Ambulatory Visit: Payer: Medicaid Other | Admitting: Family Medicine

## 2015-01-08 ENCOUNTER — Ambulatory Visit: Payer: Medicaid Other | Admitting: Family Medicine

## 2015-02-08 ENCOUNTER — Ambulatory Visit (INDEPENDENT_AMBULATORY_CARE_PROVIDER_SITE_OTHER): Payer: Medicaid Other | Admitting: *Deleted

## 2015-02-08 DIAGNOSIS — Z3042 Encounter for surveillance of injectable contraceptive: Secondary | ICD-10-CM

## 2015-02-08 MED ORDER — MEDROXYPROGESTERONE ACETATE 150 MG/ML IM SUSP
150.0000 mg | INTRAMUSCULAR | Status: DC
  Administered 2015-02-08: 150 mg via INTRAMUSCULAR

## 2015-02-08 NOTE — Progress Notes (Signed)
Patient here today for Depo Provera injection.  Depo given today in RUOQ.  Site unremarkable & patient tolerated injection.  Next injection due October 14-28.  Reminder card given.  Altamese Dilling, BSN, RN-BC

## 2015-03-25 ENCOUNTER — Encounter: Payer: Medicaid Other | Admitting: Family Medicine

## 2015-05-01 ENCOUNTER — Ambulatory Visit (INDEPENDENT_AMBULATORY_CARE_PROVIDER_SITE_OTHER): Payer: Medicaid Other | Admitting: *Deleted

## 2015-05-01 DIAGNOSIS — Z3042 Encounter for surveillance of injectable contraceptive: Secondary | ICD-10-CM

## 2015-05-01 DIAGNOSIS — Z23 Encounter for immunization: Secondary | ICD-10-CM | POA: Diagnosis not present

## 2015-05-01 MED ORDER — MEDROXYPROGESTERONE ACETATE 150 MG/ML IM SUSP
150.0000 mg | Freq: Once | INTRAMUSCULAR | Status: AC
  Administered 2015-05-01: 150 mg via INTRAMUSCULAR

## 2015-05-01 NOTE — Progress Notes (Signed)
   Pt in for Depo Provera injection.  Pt tolerated Depo injection. Depo given right upper outer quadrant.  Next injection due Jan. 4-Jul 31, 2015.  Reminder card given. Peyton Spengler L, RN  

## 2015-05-10 ENCOUNTER — Encounter: Payer: Medicaid Other | Admitting: Family Medicine

## 2015-05-15 ENCOUNTER — Encounter: Payer: Self-pay | Admitting: Family Medicine

## 2015-05-15 ENCOUNTER — Ambulatory Visit (INDEPENDENT_AMBULATORY_CARE_PROVIDER_SITE_OTHER): Payer: Medicaid Other | Admitting: Family Medicine

## 2015-05-15 VITALS — BP 114/68 | HR 64 | Temp 98.2°F | Ht 66.5 in | Wt 173.0 lb

## 2015-05-15 DIAGNOSIS — R300 Dysuria: Secondary | ICD-10-CM

## 2015-05-15 DIAGNOSIS — N390 Urinary tract infection, site not specified: Secondary | ICD-10-CM | POA: Diagnosis not present

## 2015-05-15 MED ORDER — CEPHALEXIN 500 MG PO CAPS: 500.0000 mg | ORAL_CAPSULE | Freq: Two times a day (BID) | ORAL | Status: DC

## 2015-05-15 NOTE — Patient Instructions (Signed)
Thank you so much for coming to visit me today! Please take an antibiotic twice a day for seven days. If your symptoms to not improve with this, please return. If you develop fevers or chills or the flank pain becomes more severe, please schedule an appointment as soon as possible or go to the Emergency Department.  Thanks again! Dr. Caroleen Hammanumley

## 2015-05-16 DIAGNOSIS — N390 Urinary tract infection, site not specified: Secondary | ICD-10-CM | POA: Insufficient documentation

## 2015-05-16 NOTE — Progress Notes (Signed)
Subjective:     Patient ID: Deborah Mahoney, female   DOB: 1991/01/11, 24 y.o.   MRN: [id number]  HPI Deborah Mahoney is a 24yo female presenting today for dysurea:  # Dysurea: - Has noted pain and smelly urine since last week - Has been using Cranberry Juice and Water, which seems to be helping somewhat. Has also been using Azo. - Dysurea has improved, but is still present. Foul odor of urine has improved. - Initially burning was present throughout entire urination. Now burning present at the end of urination. - Does not note increased frequency or increased urgency - Denies fever, chills - Notes suprapubic pain and left flank pain x1 week. Was more active than normal before flank pain started and she believes it feels more musculoskeletal.  Review of Systems Per HPI    Objective:   Physical Exam  Constitutional: She appears well-developed and well-nourished. No distress.  HENT:  Head: Normocephalic and atraumatic.  Cardiovascular: Normal rate and regular rhythm.  Exam reveals no gallop and no friction rub.   No murmur heard. Pulmonary/Chest: Effort normal. No respiratory distress. She has no wheezes. She has no rales.  Abdominal: Soft. Bowel sounds are normal. She exhibits no distension. There is no rebound.  Mild suprapubic tenderness, no CVA tenderness noted  Musculoskeletal: She exhibits no edema.  Psychiatric: She has a normal mood and affect. Her behavior is normal.      Assessment and Plan:     UTI (urinary tract infection) - Note unable to urinate after waiting in clinic for approximately two hours. No urinalysis or urine culture obtained. - Pyelonephritis considered given flank pain, however determined to be unlikely given no CVA tenderness noted on exam, no fevers, and no chills.  - Prescription for Keflex given - If fevers, chills, or worsening flank pain occur, call office or go to Emergency Department - Discussed importance of urinalysis for diagnosis of UTI. Agrees to  prepared to give urine sample if symptoms do not improve or if she develops similar symptoms in the future.

## 2015-05-16 NOTE — Assessment & Plan Note (Addendum)
-   Note unable to urinate after waiting in clinic for approximately two hours. No urinalysis or urine culture obtained. - Pyelonephritis considered given flank pain, however determined to be unlikely given no CVA tenderness noted on exam, no fevers, and no chills.  - Prescription for Keflex given - If fevers, chills, or worsening flank pain occur, call office or go to Emergency Department - Discussed importance of urinalysis for diagnosis of UTI. Agrees to prepared to give urine sample if symptoms do not improve or if she develops similar symptoms in the future.

## 2015-05-28 ENCOUNTER — Encounter: Payer: Medicaid Other | Admitting: Family Medicine

## 2015-06-18 ENCOUNTER — Ambulatory Visit (INDEPENDENT_AMBULATORY_CARE_PROVIDER_SITE_OTHER): Payer: Medicaid Other | Admitting: Family Medicine

## 2015-06-18 ENCOUNTER — Encounter: Payer: Self-pay | Admitting: Family Medicine

## 2015-06-18 VITALS — BP 113/61 | HR 59 | Temp 97.9°F | Wt 178.5 lb

## 2015-06-18 DIAGNOSIS — Z0000 Encounter for general adult medical examination without abnormal findings: Secondary | ICD-10-CM

## 2015-06-18 DIAGNOSIS — Z111 Encounter for screening for respiratory tuberculosis: Secondary | ICD-10-CM | POA: Diagnosis present

## 2015-06-18 NOTE — Progress Notes (Signed)
Subjective:     Deborah Mahoney is a 24 y.o. female and is here for a comprehensive physical exam. The patient reports 2 episodes of anxiety over the past month, otherwise no complaints.   Social History   Social History  . Marital Status: Single    Spouse Name: N/A  . Number of Children: N/A  . Years of Education: N/A   Occupational History  . Not on file.   Social History Main Topics  . Smoking status: Former Games developermoker  . Smokeless tobacco: Never Used  . Alcohol Use: Yes     Comment: occ stoppped with pregnancy  . Drug Use: Yes    Special: Marijuana     Comment: stopped when found out pregnant  . Sexual Activity: Yes    Birth Control/ Protection: None   Other Topics Concern  . Not on file   Social History Narrative   Health Maintenance  Topic Date Due  . INFLUENZA VACCINE  02/11/2016  . PAP SMEAR  03/30/2016  . TETANUS/TDAP  09/27/2023  . HIV Screening  Completed    The following portions of the patient's history were reviewed and updated as appropriate: allergies, current medications, past family history, past medical history, past social history, past surgical history and problem list.  Review of Systems A comprehensive review of systems was negative.   Objective:    BP 113/61 mmHg  Pulse 59  Temp(Src) 97.9 F (36.6 C) (Oral)  Wt 178 lb 8 oz (80.967 kg)  Breastfeeding? No General appearance: alert, cooperative, appears stated age and no distress Head: Normocephalic, without obvious abnormality, atraumatic Eyes: conjunctivae/corneas clear. PERRL, EOM's intact. Fundi benign. Throat: lips, mucosa, and tongue normal; teeth and gums normal Neck: no adenopathy, no JVD, supple, symmetrical, trachea midline and thyroid not enlarged, symmetric, no tenderness/mass/nodules Lungs: clear to auscultation bilaterally Heart: regular rate and rhythm, S1, S2 normal, no murmur, click, rub or gallop Abdomen: soft, non-tender; bowel sounds normal; no masses,  no  organomegaly Extremities: extremities normal, atraumatic, no cyanosis or edema Pulses: 2+ and symmetric Skin: Skin color, texture, turgor normal. No rashes or lesions Neurologic: Alert and oriented X 3, normal strength and tone. Normal symmetric reflexes. Normal coordination and gait    Assessment:    Healthy female exam.      Plan:   TST given today. Will follow up in nursing clinic for reading.  Follow up in 1 year for annual exam and repeat pap test.     See After Visit Summary for Counseling Recommendations

## 2015-06-18 NOTE — Addendum Note (Signed)
Addended by: Ardith DarkPARKER, Suleika Donavan M on: 06/18/2015 04:41 PM   Modules accepted: Kipp BroodSmartSet

## 2015-06-18 NOTE — Patient Instructions (Signed)
Thank you for coming to the clinic today. It was nice seeing you.  Please come back next year for your pap test and physical.   Take care,  Dr Jimmey RalphParker

## 2015-06-20 ENCOUNTER — Ambulatory Visit (INDEPENDENT_AMBULATORY_CARE_PROVIDER_SITE_OTHER): Payer: Medicaid Other | Admitting: *Deleted

## 2015-06-20 DIAGNOSIS — Z7689 Persons encountering health services in other specified circumstances: Secondary | ICD-10-CM

## 2015-06-20 DIAGNOSIS — Z111 Encounter for screening for respiratory tuberculosis: Secondary | ICD-10-CM

## 2015-06-20 LAB — POCT URINALYSIS DIPSTICK

## 2015-06-20 LAB — TB SKIN TEST
Induration: 0 mm
TB Skin Test: NEGATIVE

## 2015-06-20 NOTE — Progress Notes (Signed)
   PPD Reading Note PPD read and results entered in Epic[employer]. Result: 0 mm induration. Interpretation: Negative If test not read within 48-72 hours of initial placement, patient advised to repeat in other arm 1-3 weeks after this test. Allergic reaction: no  Martin, Tamika L, RN  

## 2015-06-24 ENCOUNTER — Telehealth: Payer: Self-pay | Admitting: Family Medicine

## 2015-06-24 NOTE — Telephone Encounter (Signed)
Feels like BC is causing side effects such as severe headaches, nausea. Please advise

## 2015-06-24 NOTE — Telephone Encounter (Signed)
Spoke with patient, she is having headaches and nausea but states that Ibuprofen is helping.  She states that this was happening with the last birth control as well, at this time she has decided not to continue depo in January.  Her and her boyfriend have discussed the use of condoms.  She wanted MD to know. Deborah Mahoney, Maryjo RochesterJessica Dawn

## 2015-12-10 ENCOUNTER — Ambulatory Visit (INDEPENDENT_AMBULATORY_CARE_PROVIDER_SITE_OTHER): Payer: Medicaid Other | Admitting: Family Medicine

## 2015-12-10 ENCOUNTER — Ambulatory Visit: Payer: Medicaid Other | Admitting: Internal Medicine

## 2015-12-10 ENCOUNTER — Encounter: Payer: Self-pay | Admitting: Family Medicine

## 2015-12-10 VITALS — BP 119/61 | HR 74 | Temp 98.2°F | Ht 66.5 in | Wt 189.6 lb

## 2015-12-10 DIAGNOSIS — R519 Headache, unspecified: Secondary | ICD-10-CM

## 2015-12-10 DIAGNOSIS — R51 Headache: Secondary | ICD-10-CM | POA: Diagnosis present

## 2015-12-10 LAB — POCT URINE PREGNANCY: PREG TEST UR: NEGATIVE

## 2015-12-10 NOTE — Progress Notes (Signed)
   Subjective:    Patient ID: Deborah Mahoney, female    DOB: 1990-08-12, 25 y.o.   MRN: [id number]  Seen for Same day visit for   CC: HEADACHE  Headache started 2 days ago Pain is throbbing and sharp Severity:  5/10 Location: right frontal  Medications tried: nothing today or yesterday  Head trauma: none  Sudden onset: no Previous similar headaches: once or twice before  Taking blood thinners: no History of cancer: no  Symptoms Nose congestion stuffiness:  no Nausea vomiting: yes  Photophobia: no Noise sensitivity: yes Double vision or loss of vision: no Fever: no Neck Stiffness: no Trouble walking or speaking: no  Possible for being pregnant.  Was on depo-provera for about one year and then stopped in January 2017.  Unsure of her LMP since she has been on several contraceptives in the past few months.   PMH: history of chlamydia  SH: denies any tobacco or alcohol use  FH: HTN, HLD, Dm2  Review of Systems   See HPI for ROS. Objective:  BP 119/61 mmHg  Pulse 74  Temp(Src) 98.2 F (36.8 C) (Oral)  Ht 5' 6.5" (1.689 m)  Wt 189 lb 9.6 oz (86.002 kg)  BMI 30.15 kg/m2  General: NAD HEENT: clear conjunctiva, MMM, EOMI, PERRL,  Cardiac: RRR, normal heart sounds, no murmurs. Normal distal pulses  Respiratory: CTAB, normal effort Extremities: no edema,. WWP. Skin: warm and dry, no rashes noted Neuro: CN 2-12 intact, 5/5 strength upper and lower extremities. Normal sensation in upper and lower extremities. Normal gait.      Assessment & Plan:   Headache Most likely this is a tension headache.  She has no prior history of migraine or family history of migraine.  Doesn't appear to be rebound.  No neurological deficits.  - urine pregnancy negative.  - advised sleep hygiene, NSAIDS, or/and relaxation techniques. If no improvement can consider PT.  - encouraged to keep a headache diary.

## 2015-12-10 NOTE — Patient Instructions (Signed)
Thank you for coming in,   You can use medications like Excedrin for headache.   We can also try physical therapy in the future.   Please bring all of your medications with you to each visit.   Health maintenance items that are due.  Health Maintenance  Topic Date Due  . Flu Shot  02/11/2016  . Pap Smear  03/30/2016  . Tetanus Vaccine  09/27/2023  . HIV Screening  Completed     Sign up for My Chart to have easy access to your labs results, and communication with your Primary care physician   Please feel free to call with any questions or concerns at any time, at [phone]. --Dr. Jordan LikesSchmitz  Tension Headache A tension headache is a feeling of pain, pressure, or aching that is often felt over the front and sides of the head. The pain can be dull, or it can feel tight (constricting). Tension headaches are not normally associated with nausea or vomiting, and they do not get worse with physical activity. Tension headaches can last from 30 minutes to several days. This is the most common type of headache. CAUSES The exact cause of this condition is not known. Tension headaches often begin after stress, anxiety, or depression. Other triggers may include:  Alcohol.  Too much caffeine, or caffeine withdrawal.  Respiratory infections, such as colds, flu, or sinus infections.  Dental problems or teeth clenching.  Fatigue.  Holding your head and neck in the same position for a long period of time, such as while using a computer.  Smoking. SYMPTOMS Symptoms of this condition include:  A feeling of pressure around the head.  Dull, aching head pain.  Pain felt over the front and sides of the head.  Tenderness in the muscles of the head, neck, and shoulders. DIAGNOSIS This condition may be diagnosed based on your symptoms and a physical exam. Tests may be done, such as a CT scan or an MRI of your head. These tests may be done if your symptoms are severe or unusual. TREATMENT This  condition may be treated with lifestyle changes and medicines to help relieve symptoms. HOME CARE INSTRUCTIONS Managing Pain  Take over-the-counter and prescription medicines only as told by your health care provider.  Lie down in a dark, quiet room when you have a headache.  If directed, apply ice to the head and neck area:  Put ice in a plastic bag.  Place a towel between your skin and the bag.  Leave the ice on for 20 minutes, 2-3 times per day.  Use a heating pad or a hot shower to apply heat to the head and neck area as told by your health care provider. Eating and Drinking  Eat meals on a regular schedule.  Limit alcohol use.  Decrease your caffeine intake, or stop using caffeine. General Instructions  Keep all follow-up visits as told by your health care provider. This is important.  Keep a headache journal to help find out what may trigger your headaches. For example, write down:  What you eat and drink.  How much sleep you get.  Any change to your diet or medicines.  Try massage or other relaxation techniques.  Limit stress.  Sit up straight, and avoid tensing your muscles.  Do not use tobacco products, including cigarettes, chewing tobacco, or e-cigarettes. If you need help quitting, ask your health care provider.  Exercise regularly as told by your health care provider.  Get 7-9 hours of sleep, or  the amount recommended by your health care provider. SEEK MEDICAL CARE IF:  Your symptoms are not helped by medicine.  You have a headache that is different from what you normally experience.  You have nausea or you vomit.  You have a fever. SEEK IMMEDIATE MEDICAL CARE IF:  Your headache becomes severe.  You have repeated vomiting.  You have a stiff neck.  You have a loss of vision.  You have problems with speech.  You have pain in your eye or ear.  You have muscular weakness or loss of muscle control.  You lose your balance or you have  trouble walking.  You feel faint or you pass out.  You have confusion.   This information is not intended to replace advice given to you by your health care provider. Make sure you discuss any questions you have with your health care provider.   Document Released: 06/29/2005 Document Revised: 03/20/2015 Document Reviewed: 10/22/2014 Elsevier Interactive Patient Education [employer]

## 2015-12-11 DIAGNOSIS — R519 Headache, unspecified: Secondary | ICD-10-CM | POA: Insufficient documentation

## 2015-12-11 DIAGNOSIS — R51 Headache: Secondary | ICD-10-CM

## 2015-12-11 NOTE — Assessment & Plan Note (Signed)
Most likely this is a tension headache.  She has no prior history of migraine or family history of migraine.  Doesn't appear to be rebound.  No neurological deficits.  - urine pregnancy negative.  - advised sleep hygiene, NSAIDS, or/and relaxation techniques. If no improvement can consider PT.  - encouraged to keep a headache diary.

## 2016-01-07 ENCOUNTER — Encounter: Payer: Self-pay | Admitting: Family Medicine

## 2016-01-07 ENCOUNTER — Ambulatory Visit (INDEPENDENT_AMBULATORY_CARE_PROVIDER_SITE_OTHER): Payer: Medicaid Other | Admitting: Family Medicine

## 2016-01-07 VITALS — BP 104/64 | HR 66 | Temp 98.3°F | Ht 66.5 in | Wt 189.4 lb

## 2016-01-07 DIAGNOSIS — N644 Mastodynia: Secondary | ICD-10-CM | POA: Diagnosis not present

## 2016-01-07 NOTE — Patient Instructions (Signed)
Thank you for coming in today!  - It's very important that you let us know if anything changes, but for now I suspect this will go away on its own.   Our clinic's number is [phone]. Feel free to call any time with questions or concerns. We will answer any questions after hours with our 24-hour emergency line at that number as well.   - Dr. Jarvis NewcomerGrunz

## 2016-01-07 NOTE — Progress Notes (Signed)
Subjective: Deborah Mahoney is a 25 y.o. female presenting for left breast pain.  She reports 2 days of skin sensitivity causing pain with clothing over her left breast. Pain is "superficial" and sharp/burning, mild (she's more worried about what it is than the pain itself), and nothing's been tried for it. Nothing makes it better that she knows. She denies redness, warmth, rash, nipple discharge/bleeding. Denies history of similar sensation, abscess formation, or palpable cyst/nodules in the breast. Not sexually active.  - ROS: Further denies vaginal bleeding or discharge, fever, abdominal pain, N/V/D. No chest pain, dyspnea, palpitations, trauma.  - Former smoker  Objective: BP 104/64 mmHg  Pulse 66  Temp(Src) 98.3 F (36.8 C) (Oral)  Ht 5' 6.5" (1.689 m)  Wt 189 lb 6.4 oz (85.911 kg)  BMI 30.12 kg/m2 Gen: Well-appearing 25 y.o. female in no distress CV: Regular rate, no murmur Pulm: Non-labored, clear Skin: No rashes, wounds, ulcers Breast: Left breast with normal appearance, no palpable nodules or cysts, no nipple discharge or irregularity. Symmetric with right. No erythema or induration. No axillary lymphadenopathy or abscess. No tenderness elicited with palpation. - April Zimmerman-Rumple, CMA present throughout exam.  Assessment/Plan: Deborah Mahoney is a 25 y.o. female here for left breast pain. Not very bothersome and for a short duration without indications of infection (mastitis/abscess/cellulitis), of rash (e.g. zoster), or primary breast disease (no cysts, nodules, induration). Not typical of MSK, cardiac or pulmonary etiology. Perhaps is early zoster without cutaneous manifestations yet or atypical neuropathy causing hypesthesia.  - Will monitor closely for development. At this point, nothing immediately life-threatening is evident or suspected and the patient is reassured by this. - Pt left office prior to repeat UPT (recently negative and reports abstinence), but could  consider this if symptoms continue.

## 2016-01-09 ENCOUNTER — Telehealth: Payer: Self-pay | Admitting: Family Medicine

## 2016-01-09 MED ORDER — TRIAMCINOLONE ACETONIDE 0.1 % EX CREA: 1.0000 "application " | TOPICAL_CREAM | Freq: Two times a day (BID) | CUTANEOUS | Status: DC

## 2016-01-09 NOTE — Telephone Encounter (Signed)
Will forward to MD who saw patient. Jeannie Mallinger,CMA  

## 2016-01-09 NOTE — Telephone Encounter (Signed)
I discussed this concern with the patient who has continued (not new or worse) red rash between her breasts which she noticed for the first time during our exam on 6/27. It appeared consistent with contact dermatitis with unique confluence where she reported spraying perfume. At the time, we opted to wait and see if it improved with withdrawal of that irritant, but it is bothering her more now. I will send a medium potency topical steroid and she will let us know if symptoms worsen.

## 2016-01-09 NOTE — Telephone Encounter (Signed)
There is still pain under the left arm, there is now a rash between the 2 breasts. Doesn't know what is causing this. Please call pt to discuss

## 2016-01-22 ENCOUNTER — Telehealth: Payer: Self-pay | Admitting: Family Medicine

## 2016-01-22 NOTE — Telephone Encounter (Signed)
Pt called and would like a letter stating that she needs to excused from school at this time. She goes to college and she had begun to have a lot of stress and anxiety attacks. Please call her with questions and when this would be ready. jw

## 2016-01-23 ENCOUNTER — Ambulatory Visit (INDEPENDENT_AMBULATORY_CARE_PROVIDER_SITE_OTHER): Payer: Medicaid Other | Admitting: Student

## 2016-01-23 ENCOUNTER — Encounter: Payer: Self-pay | Admitting: Student

## 2016-01-23 VITALS — BP 112/63 | HR 70 | Temp 98.7°F | Wt 193.0 lb

## 2016-01-23 DIAGNOSIS — F411 Generalized anxiety disorder: Secondary | ICD-10-CM | POA: Diagnosis present

## 2016-01-23 HISTORY — DX: Generalized anxiety disorder: F41.1

## 2016-01-23 MED ORDER — SERTRALINE HCL 25 MG PO TABS: 25.0000 mg | ORAL_TABLET | Freq: Every day | ORAL | Status: DC

## 2016-01-23 NOTE — Telephone Encounter (Signed)
Scheduled for 01-23-16 with Dr. Kennon RoundsHaney. Jazmin Hartsell,CMA

## 2016-01-23 NOTE — Progress Notes (Signed)
   Subjective:    Patient ID: Deborah Mahoney, female    DOB: [birth year], [age] y.oSteffanie Mahoney.   MRN: [id number]   JX:BJYNWGNCC:anxiety  HPI: 25 year old female presenting for concern for anxiety  Anxiety - Has had 4 panic attacks since beginning of this year - reports a family history of anxiety depression - has never been diagnosed or treated for this - reports constant worry, about her job, school, caring for her 27-year-old child - She now feels that her anxiety is impacting and ability to function - he requests a note to that she may take time away from school in order to get her anxiety under control -Denies low mood, SI, HI   Smoking status reviewed  Review of Systems  Per HPI, else denies recent illness, fever, headache, changes in vision, chest pain, shortness of breath, abdominal pain, N/V/D, weakness  GAD7- 14 PHQ9- 4  Objective:  BP 112/63 mmHg  Pulse 70  Temp(Src) 98.7 F (37.1 C) (Oral)  Wt 193 lb (87.544 kg)  SpO2 100% Vitals and nursing note reviewed  General: NAD Cardiac: RRR,  Respiratory: CTAB, normal effort Extremities: no edema or cyanosis. WWP. Skin: warm and dry, no rashes noted Neuro: alert and oriented,   Assessment & Plan:    Generalized anxiety disorder History and gad 7 consistent with generalized anxiety disorder. - Will start Zoloft 25 mg daily and increase dose to effect - Referred to behavioral health for therapy - Follow-up in 2 weeks with PCP to assess tolerance of Zoloft - Letter to school for excused away time given     Raylen Tangonan A. Kennon RoundsHaney MD, MS Family Medicine Resident PGY-2 Pager [phone]

## 2016-01-23 NOTE — Assessment & Plan Note (Addendum)
History and gad 7 consistent with generalized anxiety disorder. - Will start Zoloft 25 mg daily and increase dose to effect - Referred to behavioral health for therapy - Follow-up in 2 weeks with PCP to assess tolerance of Zoloft - Letter to school for excused away time given

## 2016-01-23 NOTE — Telephone Encounter (Signed)
I don't see any problems on her list regarding anxiety. She needs an appointment to discuss this further if it is causing her to miss school work.  Katina Degreealeb M. Jimmey RalphParker, MD [facility] Resident PGY-3 01/23/2016 1:17 PM

## 2016-01-23 NOTE — Patient Instructions (Signed)
Follow up in 2 weeks with PCP for follow up of Zoloft Make an appointment as soon as possible with Behavioral health Take Zoloft daily If you have any questions or concerns, call the office at [phone]

## 2016-01-28 ENCOUNTER — Ambulatory Visit: Payer: Medicaid Other

## 2016-02-10 ENCOUNTER — Ambulatory Visit: Payer: Medicaid Other | Admitting: Family Medicine

## 2016-04-14 ENCOUNTER — Ambulatory Visit: Payer: Medicaid Other | Admitting: Family Medicine

## 2016-04-22 ENCOUNTER — Inpatient Hospital Stay (HOSPITAL_COMMUNITY)
Admission: AD | Admit: 2016-04-22 | Discharge: 2016-04-22 | Disposition: A | Discharge status: Home / Self Care | Payer: Medicaid Other | Source: Ambulatory Visit | Attending: Family Medicine | Admitting: Family Medicine

## 2016-04-22 ENCOUNTER — Inpatient Hospital Stay (HOSPITAL_COMMUNITY): Payer: Medicaid Other

## 2016-04-22 ENCOUNTER — Encounter (HOSPITAL_COMMUNITY): Payer: Self-pay | Admitting: *Deleted

## 2016-04-22 DIAGNOSIS — Z87440 Personal history of urinary (tract) infections: Secondary | ICD-10-CM | POA: Insufficient documentation

## 2016-04-22 DIAGNOSIS — R109 Unspecified abdominal pain: Secondary | ICD-10-CM | POA: Diagnosis not present

## 2016-04-22 DIAGNOSIS — E739 Lactose intolerance, unspecified: Secondary | ICD-10-CM | POA: Insufficient documentation

## 2016-04-22 DIAGNOSIS — Z87891 Personal history of nicotine dependence: Secondary | ICD-10-CM | POA: Diagnosis not present

## 2016-04-22 DIAGNOSIS — R141 Gas pain: Secondary | ICD-10-CM | POA: Insufficient documentation

## 2016-04-22 DIAGNOSIS — D649 Anemia, unspecified: Secondary | ICD-10-CM | POA: Insufficient documentation

## 2016-04-22 DIAGNOSIS — R1032 Left lower quadrant pain: Secondary | ICD-10-CM | POA: Diagnosis present

## 2016-04-22 LAB — CBC
HCT: 37.3 % (ref 36.0–46.0)
Hemoglobin: 12.8 g/dL (ref 12.0–15.0)
MCH: 30.4 pg (ref 26.0–34.0)
MCHC: 34.3 g/dL (ref 30.0–36.0)
MCV: 88.6 fL (ref 78.0–100.0)
PLATELETS: 220 10*3/uL (ref 150–400)
RBC: 4.21 MIL/uL (ref 3.87–5.11)
RDW: 12.2 % (ref 11.5–15.5)
WBC: 10 10*3/uL (ref 4.0–10.5)

## 2016-04-22 LAB — WET PREP, GENITAL
Clue Cells Wet Prep HPF POC: NONE SEEN
Sperm: NONE SEEN
Trich, Wet Prep: NONE SEEN
Yeast Wet Prep HPF POC: NONE SEEN

## 2016-04-22 LAB — COMPREHENSIVE METABOLIC PANEL
ALT: 17 U/L (ref 14–54)
ANION GAP: 6 (ref 5–15)
AST: 18 U/L (ref 15–41)
Albumin: 3.9 g/dL (ref 3.5–5.0)
Alkaline Phosphatase: 45 U/L (ref 38–126)
BUN: 18 mg/dL (ref 6–20)
CHLORIDE: 105 mmol/L (ref 101–111)
CO2: 26 mmol/L (ref 22–32)
CREATININE: 0.7 mg/dL (ref 0.44–1.00)
Calcium: 8.7 mg/dL — ABNORMAL LOW (ref 8.9–10.3)
Glucose, Bld: 118 mg/dL — ABNORMAL HIGH (ref 65–99)
POTASSIUM: 3.3 mmol/L — AB (ref 3.5–5.1)
SODIUM: 137 mmol/L (ref 135–145)
Total Bilirubin: 0.7 mg/dL (ref 0.3–1.2)
Total Protein: 7.2 g/dL (ref 6.5–8.1)

## 2016-04-22 LAB — URINE MICROSCOPIC-ADD ON: BACTERIA UA: NONE SEEN

## 2016-04-22 LAB — URINALYSIS, ROUTINE W REFLEX MICROSCOPIC
BILIRUBIN URINE: NEGATIVE
Glucose, UA: NEGATIVE mg/dL
Ketones, ur: NEGATIVE mg/dL
NITRITE: NEGATIVE
Protein, ur: NEGATIVE mg/dL
SPECIFIC GRAVITY, URINE: 1.025 (ref 1.005–1.030)
pH: 6 (ref 5.0–8.0)

## 2016-04-22 LAB — POCT PREGNANCY, URINE: PREG TEST UR: NEGATIVE

## 2016-04-22 MED ORDER — IBUPROFEN 600 MG PO TABS
600.0000 mg | ORAL_TABLET | Freq: Once | ORAL | Status: AC
  Administered 2016-04-22: 600 mg via ORAL
  Filled 2016-04-22: qty 1

## 2016-04-22 MED ORDER — SIMETHICONE 80 MG PO CHEW
160.0000 mg | CHEWABLE_TABLET | Freq: Once | ORAL | Status: AC
Start: 2016-04-22 — End: 2016-04-22
  Administered 2016-04-22: 160 mg via ORAL
  Filled 2016-04-22: qty 2

## 2016-04-22 MED ORDER — IBUPROFEN 600 MG PO TABS: 600.0000 mg | ORAL_TABLET | Freq: Four times a day (QID) | ORAL | 0 refills | Status: DC | PRN

## 2016-04-22 NOTE — Discharge Instructions (Signed)
Diet for Lactose Intolerance, Adult  Lactose intolerance is when the body is not able to digest lactose, a natural sugar found in milk and milk products. If you are lactose intolerant, you should avoid consuming food and drinks with lactose.   WHAT DO I NEED TO KNOW ABOUT THIS DIET?  · Avoid consuming foods and beverages with lactose.  · Look for the words "lactose-free" or "lactose-reduced" on food labels. You can have lactose-free foods and may be able to have small amounts of lactose-reduced foods.  · Make sure you get enough nutrients in your diet. People on this diet sometimes have trouble getting enough calcium, riboflavin, and vitamin D. Take supplements if directed by your health care provider. Talk to your health care provider about supplements if you are not taking any.  WHICH FOODS HAVE LACTOSE?  Lactose is found in milk and milk products, such as:   · Yogurt.    · Cheese.  · Butter.    · Margarine.  · Sour cream.    · Creamer.    · Whipped toppings and nondairy creamers.  · Ice cream and other milk-based desserts.  Lactose is also found in foods made with milk or milk ingredients. To find out whether a food is made with milk or a milk ingredient, look at the ingredients list. Avoid foods with the statement "May contain milk" and foods that contain:   · Butter.    · Cream.   · Milk.  · Milk solids.  · Milk powder.     · Whey.  · Curd.   · Caseinate.  · Lactose.  WHAT ARE SOME ALTERNATIVES TO MILK AND FOODS MADE WITH MILK PRODUCTS?  · Lactose-free products, such as lactose-free milk.  · Almond or rice milk.  · Soy products, such as soy yogurt, soy cheese, soy ice cream, soy-based sour cream, and soy-based infant formula.  · Nondairy products, such as nondairy creamers and nondairy whipped topping. Note that nondairy products sometimes contain lactose, so it is important to check the ingredients list.  CAN I HAVE ANY FOODS WITH LACTOSE?  Some people with lactose intolerance can safely eat foods that have a  little lactose. Foods with a little lactose have less than 1 g of lactose per serving. Examples of foods with a little lactose are:   · Aged cheese (such as Swiss, cheddar, or Parmesan cheese). One serving is about 1-2 oz.  · Cream cheese. One serving is about 2 Tbsp.  · Ricotta cheese. One serving is about ½ cup.  If you decide to try a food that has lactose:   · Eat only one food with lactose in it at a time.  · Eat only a small amount of the food.  · Stop eating the food if your symptoms return.  Some dairy products that are more likely than others to be tolerated include:  · Cheese, especially if it is aged.  · Cultured dairy products, such as yogurt, buttermilk, cottage cheese, and kefir. The healthy bacteria in these products help digest lactose.  · Lactose-hydrolyzed milk. This product contains 40-90% less lactose than milk.  AM I GETTING ENOUGH CALCIUM?  Calcium is found in many foods with lactose and is important for bone health. The amount of calcium you need depends on your age:   · Adults younger than 50 years need 1000 mg of calcium a day.  · Adults older than 50 years need 1200 mg of calcium a day.  Make sure you get enough calcium by taking a calcium supplement   or by eating lactose-free foods that are high in calcium, such as:   · Almonds, ¼ cup (95 mg).  · Broccoli, cooked, 1 cup (60 mg).  · Calcium-fortified breakfast cereals, 1 cup (100-1000 mg).  · Calcium-fortified rice or almond milk, 1 cup (300 mg).  · Calcium-fortified soy milk, 1 cup (300-400 mg).  · Canned salmon with edible bones, 3 oz (180 mg).  · Collard greens, cooked, ½ cup (125 mg).  · Edamame, cooked, ½ cup (125 mg).  · Kale, frozen or cooked, ½ cup (90 mg).  · Orange juice with calcium added, 1 cup (300-350 mg).  · Sardines with edible bones, 3 oz (325 mg).  · Spinach, cooked, ½ cup (145 mg).  · Tofu set with calcium sulfate, ½ cup (250 mg).     This information is not intended to replace advice given to you by your health care  provider. Make sure you discuss any questions you have with your health care provider.     Document Released: 01/24/2014 Document Reviewed: 01/24/2014  Elsevier Interactive Patient Education ©2016 [employer]

## 2016-04-22 NOTE — MAU Provider Note (Signed)
History     CSN: [id number]  Arrival date and time: 04/22/16 0973   First Provider Initiated Contact with Patient 04/22/16 [phone]      Chief Complaint  Patient presents with  . Abdominal Pain   HPI   Ms.Deborah Mahoney is a 25 y.o. female G1P1001 brought in by EMS here in MAU with abdominal pain. The pain started abruptly this morning at 0650. She got up to use the bathroom to urinate and started experiencing LLQ pain. The pain is constant. She had received some IV pain medication on the EMS truck; the patient called 911 who brought her here to [place] patient has known lactose intolerance, she has had a similar ED visit due to this. She ate ice cream yesterday, Mac and Cheese and milk in her cereal yesterday and woke up to this severe cramping pain.  She has not tried anything over the counter for the pain.  She received pain medication on the EMS truck.   OB History    Gravida Para Term Preterm AB Living   '1 1 1     1   ' SAB TAB Ectopic Multiple Live Births           1      Past Medical History:  Diagnosis Date  . Anemia   . Infection    UTI  . Seasonal allergies     Past Surgical History:  Procedure Laterality Date  . NO PAST SURGERIES      Family History  Problem Relation Age of Onset  . Hypertension Mother   . Hyperlipidemia Father   . Hypertension Father   . Hearing loss Neg Hx     Social History  Substance Use Topics  . Smoking status: Former Research scientist (life sciences)  . Smokeless tobacco: Former Systems developer  . Alcohol use Yes     Comment: occ stoppped with pregnancy    Allergies: No Known Allergies  No prescriptions prior to admission.   Results for orders placed or performed during the hospital encounter of 04/22/16 (from the past 48 hour(s))  CBC     Status: None   Collection Time: 04/22/16  9:13 AM  Result Value Ref Range   WBC 10.0 4.0 - 10.5 K/uL   RBC 4.21 3.87 - 5.11 MIL/uL   Hemoglobin 12.8 12.0 - 15.0 g/dL   HCT 37.3 36.0 - 46.0 %   MCV 88.6  78.0 - 100.0 fL   MCH 30.4 26.0 - 34.0 pg   MCHC 34.3 30.0 - 36.0 g/dL   RDW 12.2 11.5 - 15.5 %   Platelets 220 150 - 400 K/uL  Comprehensive metabolic panel     Status: Abnormal   Collection Time: 04/22/16  9:13 AM  Result Value Ref Range   Sodium 137 135 - 145 mmol/L   Potassium 3.3 (L) 3.5 - 5.1 mmol/L   Chloride 105 101 - 111 mmol/L   CO2 26 22 - 32 mmol/L   Glucose, Bld 118 (H) 65 - 99 mg/dL   BUN 18 6 - 20 mg/dL   Creatinine, Ser 0.70 0.44 - 1.00 mg/dL   Calcium 8.7 (L) 8.9 - 10.3 mg/dL   Total Protein 7.2 6.5 - 8.1 g/dL   Albumin 3.9 3.5 - 5.0 g/dL   AST 18 15 - 41 U/L   ALT 17 14 - 54 U/L   Alkaline Phosphatase 45 38 - 126 U/L   Total Bilirubin 0.7 0.3 - 1.2 mg/dL   GFR calc non  Af Amer >60 >60 mL/min   GFR calc Af Amer >60 >60 mL/min    Comment: (NOTE) The eGFR has been calculated using the CKD EPI equation. This calculation has not been validated in all clinical situations. eGFR's persistently <60 mL/min signify possible Chronic Kidney Disease.    Anion gap 6 5 - 15  Urinalysis, Routine w reflex microscopic (not at [facility])     Status: Abnormal   Collection Time: 04/22/16  9:35 AM  Result Value Ref Range   Color, Urine YELLOW YELLOW   APPearance CLEAR CLEAR   Specific Gravity, Urine 1.025 1.005 - 1.030   pH 6.0 5.0 - 8.0   Glucose, UA NEGATIVE NEGATIVE mg/dL   Hgb urine dipstick TRACE (A) NEGATIVE   Bilirubin Urine NEGATIVE NEGATIVE   Ketones, ur NEGATIVE NEGATIVE mg/dL   Protein, ur NEGATIVE NEGATIVE mg/dL   Nitrite NEGATIVE NEGATIVE   Leukocytes, UA SMALL (A) NEGATIVE  Urine microscopic-add on     Status: Abnormal   Collection Time: 04/22/16  9:35 AM  Result Value Ref Range   Squamous Epithelial / LPF 0-5 (A) NONE SEEN   WBC, UA 6-30 0 - 5 WBC/hpf   RBC / HPF 0-5 0 - 5 RBC/hpf   Bacteria, UA NONE SEEN NONE SEEN   Urine-Other MUCOUS PRESENT   Pregnancy, urine POC     Status: None   Collection Time: 04/22/16  9:50 AM  Result Value Ref Range   Preg Test,  Ur NEGATIVE NEGATIVE    Comment:        THE SENSITIVITY OF THIS METHODOLOGY IS >24 mIU/mL   Wet prep, genital     Status: Abnormal   Collection Time: 04/22/16 10:00 AM  Result Value Ref Range   Yeast Wet Prep HPF POC NONE SEEN NONE SEEN   Trich, Wet Prep NONE SEEN NONE SEEN   Clue Cells Wet Prep HPF POC NONE SEEN NONE SEEN   WBC, Wet Prep HPF POC FEW (A) NONE SEEN    Comment: FEW BACTERIA SEEN   Sperm NONE SEEN    US Transvaginal Non-ob  Result Date: 04/22/2016 CLINICAL DATA:  Left lower quadrant pain EXAM: TRANSABDOMINAL AND TRANSVAGINAL ULTRASOUND OF PELVIS DOPPLER ULTRASOUND OF OVARIES TECHNIQUE: Both transabdominal and transvaginal ultrasound examinations of the pelvis were performed. Transabdominal technique was performed for global imaging of the pelvis including uterus, ovaries, adnexal regions, and pelvic cul-de-sac. It was necessary to proceed with endovaginal exam following the transabdominal exam to visualize the endometrium and ovaries. Color and duplex Doppler ultrasound was utilized to evaluate blood flow to the ovaries. COMPARISON:  None. FINDINGS: Uterus Measurements: 8.3 x 3.7 x 6.0 cm. No fibroids or other mass visualized. Endometrium Thickness: 6 mm in thickness.  No focal abnormality visualized. Right ovary Measurements: 6.0 x 3.8 x 3.2 cm, appears prominent. No focal abnormality or adnexal mass. Left ovary Measurements: 7.0 x 3.7 x 4.2 cm, appears prominent. No focal abnormality or adnexal mass. Pulsed Doppler evaluation of both ovaries demonstrates normal low-resistance arterial and venous waveforms. Other findings Small amount of free fluid in the pelvis. IMPRESSION: Prominent/enlarged ovaries bilaterally without focal abnormality or torsion. Electronically Signed   By: Rolm Baptise M.D.   On: 04/22/2016 14:51   US Pelvis Complete  Result Date: 04/22/2016 CLINICAL DATA:  Left lower quadrant pain EXAM: TRANSABDOMINAL AND TRANSVAGINAL ULTRASOUND OF PELVIS DOPPLER  ULTRASOUND OF OVARIES TECHNIQUE: Both transabdominal and transvaginal ultrasound examinations of the pelvis were performed. Transabdominal technique was performed for global imaging of  the pelvis including uterus, ovaries, adnexal regions, and pelvic cul-de-sac. It was necessary to proceed with endovaginal exam following the transabdominal exam to visualize the endometrium and ovaries. Color and duplex Doppler ultrasound was utilized to evaluate blood flow to the ovaries. COMPARISON:  None. FINDINGS: Uterus Measurements: 8.3 x 3.7 x 6.0 cm. No fibroids or other mass visualized. Endometrium Thickness: 6 mm in thickness.  No focal abnormality visualized. Right ovary Measurements: 6.0 x 3.8 x 3.2 cm, appears prominent. No focal abnormality or adnexal mass. Left ovary Measurements: 7.0 x 3.7 x 4.2 cm, appears prominent. No focal abnormality or adnexal mass. Pulsed Doppler evaluation of both ovaries demonstrates normal low-resistance arterial and venous waveforms. Other findings Small amount of free fluid in the pelvis. IMPRESSION: Prominent/enlarged ovaries bilaterally without focal abnormality or torsion. Electronically Signed   By: Rolm Baptise M.D.   On: 04/22/2016 14:51   [place] Art/ven Flow Abd Pelv Doppler  Result Date: 04/22/2016 CLINICAL DATA:  Left lower quadrant pain EXAM: TRANSABDOMINAL AND TRANSVAGINAL ULTRASOUND OF PELVIS DOPPLER ULTRASOUND OF OVARIES TECHNIQUE: Both transabdominal and transvaginal ultrasound examinations of the pelvis were performed. Transabdominal technique was performed for global imaging of the pelvis including uterus, ovaries, adnexal regions, and pelvic cul-de-sac. It was necessary to proceed with endovaginal exam following the transabdominal exam to visualize the endometrium and ovaries. Color and duplex Doppler ultrasound was utilized to evaluate blood flow to the ovaries. COMPARISON:  None. FINDINGS: Uterus Measurements: 8.3 x 3.7 x 6.0 cm. No fibroids or other mass visualized.  Endometrium Thickness: 6 mm in thickness.  No focal abnormality visualized. Right ovary Measurements: 6.0 x 3.8 x 3.2 cm, appears prominent. No focal abnormality or adnexal mass. Left ovary Measurements: 7.0 x 3.7 x 4.2 cm, appears prominent. No focal abnormality or adnexal mass. Pulsed Doppler evaluation of both ovaries demonstrates normal low-resistance arterial and venous waveforms. Other findings Small amount of free fluid in the pelvis. IMPRESSION: Prominent/enlarged ovaries bilaterally without focal abnormality or torsion. Electronically Signed   By: Rolm Baptise M.D.   On: 04/22/2016 14:51    Review of Systems  Constitutional: Negative for chills and fever.  Gastrointestinal: Positive for nausea. Negative for constipation, diarrhea and vomiting.  Genitourinary: Negative for dysuria and urgency.   Physical Exam   Blood pressure 118/80, pulse 64, temperature 97.7 F (36.5 C), temperature source Oral, resp. rate 18.  Physical Exam  Constitutional: She is oriented to person, place, and time. She appears well-developed and well-nourished.  Non-toxic appearance. She does not have a sickly appearance. She does not appear ill. No distress.  HENT:  Head: Normocephalic.  GI: Soft. Normal appearance. There is tenderness in the left lower quadrant. There is no rigidity, no rebound and no guarding.  Genitourinary:  Genitourinary Comments: Vagina - Small amount of white vaginal discharge, no odor  Cervix - No contact bleeding, no active bleeding  Bimanual exam: Cervix closed Uterus non tender, normal size Significant left adnexal pain with deep palpation. No masses palpated  GC/Chlam, wet prep done Chaperone present for exam.   Musculoskeletal: Normal range of motion.  Neurological: She is alert and oriented to person, place, and time.  Skin: Skin is warm. She is not diaphoretic.  Psychiatric: Her behavior is normal.    MAU Course  Procedures  None  MDM  Patient received 150 mg of  fentanyl on EMS truck Wet prep & GC CBC & CMP  Ibuprofen 600 mg PO Gas X 2 tabs Patient continues to rate her pain 5/10  and wants to know what her pain is from. Discussed doing a pelvic US to evaluate for ovarian cyst. If negative will transfer to [facility] for further evaluation of non-gyn pain.   Patient's pain improved, she currently rates her pain 0/10.   Assessment and Plan   A:  1. Gas pain   2. Abdominal pain   3. Lactose intolerance in adult     P:  Discharge home in stable condition Rx: Ibuprofen  Ok to take Gas X as needed, as directed on the bottle Avoid dairy Return to MAU as needed, for emergencies.  Follow up wit PCP  Lezlie Lye, NP 04/25/2016 8:09 AM

## 2016-04-22 NOTE — MAU Note (Signed)
brought in by EMS; c/o LLQ pain that started this AM around 6015; no vaginal bleeding and all the HPT have been negative; had some N&V and tender breasts for "weeks"; stop BC in December;

## 2016-04-23 LAB — GC/CHLAMYDIA PROBE AMP (~~LOC~~) NOT AT ARMC
Chlamydia: NEGATIVE
NEISSERIA GONORRHEA: NEGATIVE

## 2016-04-28 ENCOUNTER — Ambulatory Visit: Payer: Medicaid Other | Admitting: Family Medicine

## 2016-06-16 ENCOUNTER — Other Ambulatory Visit (HOSPITAL_COMMUNITY)
Admission: RE | Admit: 2016-06-16 | Discharge: 2016-06-16 | Disposition: A | Discharge status: Home / Self Care | Payer: Medicaid Other | Source: Ambulatory Visit | Attending: Family Medicine | Admitting: Family Medicine

## 2016-06-16 ENCOUNTER — Encounter: Payer: Self-pay | Admitting: Family Medicine

## 2016-06-16 ENCOUNTER — Ambulatory Visit (INDEPENDENT_AMBULATORY_CARE_PROVIDER_SITE_OTHER): Payer: Medicaid Other | Admitting: Family Medicine

## 2016-06-16 VITALS — BP 102/66 | HR 74 | Temp 97.6°F | Ht 65.0 in | Wt 207.0 lb

## 2016-06-16 DIAGNOSIS — Z124 Encounter for screening for malignant neoplasm of cervix: Secondary | ICD-10-CM | POA: Insufficient documentation

## 2016-06-16 DIAGNOSIS — Z01419 Encounter for gynecological examination (general) (routine) without abnormal findings: Secondary | ICD-10-CM | POA: Insufficient documentation

## 2016-06-16 DIAGNOSIS — Z113 Encounter for screening for infections with a predominantly sexual mode of transmission: Secondary | ICD-10-CM | POA: Insufficient documentation

## 2016-06-16 DIAGNOSIS — Z23 Encounter for immunization: Secondary | ICD-10-CM

## 2016-06-16 DIAGNOSIS — N912 Amenorrhea, unspecified: Secondary | ICD-10-CM | POA: Diagnosis not present

## 2016-06-16 LAB — POCT URINE PREGNANCY: Preg Test, Ur: NEGATIVE

## 2016-06-16 NOTE — Assessment & Plan Note (Addendum)
Overdue. Last PAP 03/2013 negative. Has h/o chlamydia tx >5 years ago. Symptoms of vaginal d/c but w/o risky sexual activity.  --PAP performed with reassuring exam --Urine pregnancy obtained, negative --Will notify pt and update per results

## 2016-06-16 NOTE — Patient Instructions (Signed)
It was a pleasure to meet you today. Please see below to review our plan for today's visit.  1. You will be notified you PAP results when available. If normal, you will be good for 3 years. 2. If you continue to be without periods, please return in 3 months for evaluation. 3. You have received your flu shot.  Please call the clinic at [phone] if your symptoms worsen or you have any concerns. It was my pleasure to see you. -- Durward Parcelavid McMullen, DO [facility], PGY-1

## 2016-06-16 NOTE — Progress Notes (Signed)
   Subjective:   Patient ID: [name]    DOB: Jan 11, 1991, 25 y.o. female   MRN: [id number]  CC: "Need my PAP"  HPI: [name] is a 25 y.o. female who presents to clinic today to receive her screening PAP. Problems discussed today are as follows:  1. PAP: Due for PAP. Last was over 3 years ago and was normal. Says she has been experiencing some clear to white no odorous discharge over the past few months. Occasionally is pruritic. No fevers. Says she does not use protection but is sexually active with one female partner for over 5 years. No new partners. Patient also endorses amenorrhea since December of 2016. Denies fevers, chills, abdominal pain, nausea, vomiting, dysuria.  2. Amenorrhea: Patient without periods since December 2016. Says she was on Nexplanon and switched to Depo-Provera but has since been w/o a period. Denies increased fatigue, or dyspnea.  ROS: Complete ROS performed, see HPI for pertinent ROS.  PMFSH: Pertinent past medical, surgical, family, and social history were reviewed and updated as appropriate. Smoking status reviewed.  Medications reviewed. No current outpatient prescriptions on file.   No current facility-administered medications for this visit.     Objective:   BP 102/66   Pulse 74   Temp 97.6 F (36.4 C) (Oral)   Ht 5\' 5"  (1.651 m)   Wt 207 lb (93.9 kg)   SpO2 98%   BMI 34.45 kg/m  Vitals and nursing note reviewed.  General: well nourished, well developed, in no acute distress with non-toxic appearance HEENT: normocephalic, atraumatic, moist mucous membranes CV: regular rate and rhythm without murmurs, rubs, or gallops, no lower extremity edema Lungs: clear to auscultation bilaterally with normal work of breathing Abdomen: soft, non-tender, non-distended, no masses or organomegaly palpable, normoactive bowel sounds GU: accompanied by chaperon, no external lesions appreciated, no odor or vaginal d/c within vault, no adnexal tenderness,  non-friable cervix, no gross blood evident Skin: warm, dry, no rashes or lesions, cap refill < 2 seconds Extremities: warm and well perfused, normal tone  Assessment & Plan:   Screening for cervical cancer Overdue. Last PAP 03/2013 negative. Has h/o chlamydia tx >5 years ago. Symptoms of vaginal d/c but w/o risky sexual activity.  --PAP performed with reassuring exam --Urine pregnancy obtained, negative --Will notify pt and update per results  Amenorrhea Chronic. Onset 1 year ago since Depo-Provera. No previous h/o amenorrhea. Urine pregnancy negative. --Discussed likely cause from Depo-Provera --If amenorrhea persists for 3 months, will need to work up --RTC in 3 months  Orders Placed This Encounter  Procedures  . Flu Vaccine QUAD 36+ mos IM  . POCT urine pregnancy   No orders of the defined types were placed in this encounter.   Durward Parcelavid Mekaylah Klich, DO [facility], PGY-1 06/16/2016 8:12 PM

## 2016-06-16 NOTE — Assessment & Plan Note (Signed)
Chronic. Onset 1 year ago since Depo-Provera. No previous h/o amenorrhea. Urine pregnancy negative. --Discussed likely cause from Depo-Provera --If amenorrhea persists for 3 months, will need to work up --RTC in 3 months

## 2016-06-18 LAB — CYTOLOGY - PAP
CHLAMYDIA, DNA PROBE: NEGATIVE
Diagnosis: NEGATIVE
NEISSERIA GONORRHEA: NEGATIVE
Trichomonas: NEGATIVE

## 2016-06-22 ENCOUNTER — Encounter: Payer: Self-pay | Admitting: Family Medicine

## 2016-09-14 ENCOUNTER — Ambulatory Visit: Payer: Medicaid Other | Admitting: Family Medicine

## 2016-11-30 ENCOUNTER — Ambulatory Visit (INDEPENDENT_AMBULATORY_CARE_PROVIDER_SITE_OTHER): Payer: Self-pay | Admitting: Internal Medicine

## 2016-11-30 ENCOUNTER — Encounter: Payer: Self-pay | Admitting: Internal Medicine

## 2016-11-30 VITALS — BP 120/70 | HR 74 | Temp 98.3°F | Ht 65.0 in | Wt 210.0 lb

## 2016-11-30 DIAGNOSIS — N912 Amenorrhea, unspecified: Secondary | ICD-10-CM

## 2016-11-30 DIAGNOSIS — R300 Dysuria: Secondary | ICD-10-CM

## 2016-11-30 LAB — POCT URINALYSIS DIP (MANUAL ENTRY)
BILIRUBIN UA: NEGATIVE mg/dL
Bilirubin, UA: NEGATIVE
Glucose, UA: NEGATIVE mg/dL
NITRITE UA: POSITIVE — AB
PROTEIN UA: NEGATIVE mg/dL
Spec Grav, UA: 1.025 (ref 1.010–1.025)
Urobilinogen, UA: 0.2 E.U./dL
pH, UA: 6 (ref 5.0–8.0)

## 2016-11-30 LAB — POCT UA - MICROSCOPIC ONLY

## 2016-11-30 LAB — POCT URINE PREGNANCY: PREG TEST UR: NEGATIVE

## 2016-11-30 MED ORDER — CEPHALEXIN 500 MG PO CAPS: 500.0000 mg | ORAL_CAPSULE | Freq: Two times a day (BID) | ORAL | 0 refills | Status: DC

## 2016-11-30 NOTE — Progress Notes (Signed)
   [facility] Phone: [phone]   Date of Visit: 11/30/2016   HPI:  Deborah Rainwatereasia M Agosto is a 26 y.o. female presenting to clinic today for same day appointment. PCP: Ardith DarkParker, Caleb M, MD Concerns today include:  - hx of UTI: 4 times over the past 5-6 years. Current symptoms the first in over the past yearr - reports of burning with urination started Thursday night, no urinary frequency, no suprapubic pain  - denies vaginal discharge - started taking Azo on thursday - reports drinking water and cranberry juice  - reports of nausea but no vomiting  - LMP: 3 years ago. Patient had one period after the birth of her daughter three years ago. She then was on Nexplanon for 1 year then switched to Depo for 1 year. Has not been on Depo for 1 year. 04/2015 was the last dose. 06/2016 would be a year since not getting dose. - reports of history of irregular periods prior to starting any contraception.  - no fevers or chills or flank pain - No hx of nephrolithiasis  - is sexually active with her partner and actively trying to become pregnant.   ROS: See HPI.  PMFSH:  PMH: GAD  PHYSICAL EXAM: BP 120/70   Pulse 74   Temp 98.3 F (36.8 C) (Oral)   Ht 5\' 5"  (1.651 m)   Wt 210 lb (95.3 kg)   SpO2 98%   BMI 34.95 kg/m  GEN: NAD CV: RRR, no murmurs, rubs, or gallops PULM: CTAB, normal effort ABD: Soft, nontender, nondistended, NABS, no organomegaly. No cva tenderness SKIN: No rash or cyanosis; warm and well-perfused EXTR: No lower extremity edema or calf tenderness  ASSESSMENT/PLAN:  Acute Cystitis:  UA with positive nitrites and leukocyte esterase however since patient is on Az ua dipstick results may be falsely positive. However Urine microscopy supports UTI with 5-10 WBC, 3+ rods with rare epithelial cells.  - Keflex 500mg  biD x 5 days - urine culture  Amenorrhea with History of irregular periods: Urine pregnancy test is negative. Amenorrhea is unlikely due  to history of Depo use since it has bee about 17 months since the last dose. Will evaluate for other causes of amenorrhea including PCOS. - FSH, TSH, Prolactin, free testosterone   Palma HolterKanishka G Deborah Therriault, MD PGY 2 [facility]

## 2016-11-30 NOTE — Patient Instructions (Signed)
You have a urinary tract infection. Please take Keflex 1 tablet twice a day for 5 days. We will get lab work today.

## 2016-12-02 LAB — URINE CULTURE

## 2016-12-02 LAB — TSH: TSH: 1.1 u[IU]/mL (ref 0.450–4.500)

## 2016-12-02 LAB — TESTOSTERONE, FREE: Testosterone, Free: 4 pg/mL (ref 0.0–4.2)

## 2016-12-03 LAB — URINE CULTURE

## 2017-02-15 ENCOUNTER — Encounter: Payer: Self-pay | Admitting: Internal Medicine

## 2017-02-15 ENCOUNTER — Ambulatory Visit (INDEPENDENT_AMBULATORY_CARE_PROVIDER_SITE_OTHER): Payer: Self-pay | Admitting: Internal Medicine

## 2017-02-15 VITALS — BP 110/70 | HR 59 | Temp 98.9°F | Wt 213.0 lb

## 2017-02-15 DIAGNOSIS — G43909 Migraine, unspecified, not intractable, without status migrainosus: Secondary | ICD-10-CM

## 2017-02-15 MED ORDER — ONDANSETRON 4 MG PO TBDP: 4.0000 mg | ORAL_TABLET | Freq: Three times a day (TID) | ORAL | 0 refills | Status: DC | PRN

## 2017-02-15 MED ORDER — SUMATRIPTAN SUCCINATE 6 MG/0.5ML ~~LOC~~ SOLN
6.0000 mg | Freq: Once | SUBCUTANEOUS | Status: AC
  Administered 2017-02-15: 6 mg via SUBCUTANEOUS

## 2017-02-15 MED ORDER — KETOROLAC TROMETHAMINE 60 MG/2ML IM SOLN
60.0000 mg | Freq: Once | INTRAMUSCULAR | Status: AC
  Administered 2017-02-15: 60 mg via INTRAMUSCULAR

## 2017-02-15 NOTE — Patient Instructions (Addendum)
It was so nice to meet you!  I have given you some Toradol and Imitrex today. I have also prescribed some Zofran for you to use at home for nausea. You can continue to take Tylenol as needed at home.  Please make sure you do not take any Ibuprofen for the next couple of days.  -Dr. Nancy MarusMayo

## 2017-02-17 DIAGNOSIS — G43909 Migraine, unspecified, not intractable, without status migrainosus: Secondary | ICD-10-CM

## 2017-02-17 HISTORY — DX: Migraine, unspecified, not intractable, without status migrainosus: G43.909

## 2017-02-17 NOTE — Assessment & Plan Note (Signed)
History consistent with typical migraine. Patient having unilateral, "throbbing" pain with associated photophobia and nausea, so tension headache less likely. Neuro exam completely normal and no red flags to suggest infection or intracranial mass. - Patient given IM Toradol and Imitrex in clinic today, with improvement in her headache (unable to give Phenergan, as patient has to drive herself home) - Prescribed Zofran to help with nausea - Advised to continue to take Tylenol at home as needed, hold off on Ibuprofen for the next couple of days after receiving Toradol. - With only 4 migraines per year, patient does not meet criteria for migraine prophylaxis - Return precautions discussed - Follow-up if no improvement or if migraines become more frequent

## 2017-02-17 NOTE — Progress Notes (Signed)
   [facility] Phone: [phone]  Subjective:  Deborah Mahoney is a 26 year old female presenting to clinic with a migraine since last night. She tried taking Tylenol last night, which helped a little bit. She was able to sleep, but her headache started again this morning. She took Tylenol for a 2nd time, which helped. She has been feeling nauseated. She endorses photophobia. The headache is located on the right side of her head. The headache is "throbbing". She has had migraines for 8 years. This headache feels exactly the same as her typical migraines. She has about 4 migraines per year. The migraines usually last 2 days. She used to take Excedrin, which works really well to relieve her migraines, but it bothers her stomach and makes her feel jittery. No headache that wakes her up at night, no fevers, no chills.  ROS: See HPI for pertinent positives and negatives  Past Medical History- generalized anxiety disorder  Family history reviewed for today's visit. No changes.  Social history- patient is a former smoker  Objective: BP 110/70   Pulse (!) 59   Temp 98.9 F (37.2 C) (Oral)   Wt 213 lb (96.6 kg)   SpO2 97%   BMI 35.45 kg/m  Gen: NAD, alert, cooperative with exam Neck: Full ROM HEENT: NCAT, EOMI, MMM, PERRLA  Neuro: Alert and oriented, CN 2-12 intact, 5/5 muscle strength throughout upper and lower extremities, sensation intact to light touch throughout, finger-to-nose testing normal, heel-to-shin normal, reflexes normal  Assessment/Plan: Migraine, non-intractable: History consistent with typical migraine. Patient having unilateral, "throbbing" pain with associated photophobia and nausea, so tension headache less likely. Neuro exam completely normal and no red flags to suggest infection or intracranial mass. - Patient given IM Toradol and Imitrex in clinic today, with improvement in her headache (unable to give Phenergan, as patient has to drive herself home) -  Prescribed Zofran to help with nausea - Advised to continue to take Tylenol at home as needed, hold off on Ibuprofen for the next couple of days after receiving Toradol. - With only 4 migraines per year, patient does not meet criteria for migraine prophylaxis - Return precautions discussed - Follow-up if no improvement or if migraines become more frequent   Willadean CarolKaty Vidya Bamford, MD PGY-3

## 2017-04-14 ENCOUNTER — Encounter (HOSPITAL_COMMUNITY): Payer: Self-pay

## 2017-04-14 ENCOUNTER — Emergency Department (HOSPITAL_COMMUNITY)
Admission: EM | Admit: 2017-04-14 | Discharge: 2017-04-14 | Disposition: A | Discharge status: Home / Self Care | Payer: Self-pay | Attending: Emergency Medicine | Admitting: Emergency Medicine

## 2017-04-14 DIAGNOSIS — Z791 Long term (current) use of non-steroidal anti-inflammatories (NSAID): Secondary | ICD-10-CM | POA: Insufficient documentation

## 2017-04-14 DIAGNOSIS — D649 Anemia, unspecified: Secondary | ICD-10-CM | POA: Insufficient documentation

## 2017-04-14 DIAGNOSIS — Z79899 Other long term (current) drug therapy: Secondary | ICD-10-CM | POA: Insufficient documentation

## 2017-04-14 DIAGNOSIS — R450 Nervousness: Secondary | ICD-10-CM | POA: Insufficient documentation

## 2017-04-14 DIAGNOSIS — Z87891 Personal history of nicotine dependence: Secondary | ICD-10-CM | POA: Insufficient documentation

## 2017-04-14 LAB — BASIC METABOLIC PANEL
ANION GAP: 9 (ref 5–15)
BUN: 13 mg/dL (ref 6–20)
CO2: 26 mmol/L (ref 22–32)
CREATININE: 0.76 mg/dL (ref 0.44–1.00)
Calcium: 9.5 mg/dL (ref 8.9–10.3)
Chloride: 105 mmol/L (ref 101–111)
GFR calc non Af Amer: >60 mL/min (ref >60)
Glucose, Bld: 89 mg/dL (ref 65–99)
POTASSIUM: 3.5 mmol/L (ref 3.5–5.1)
SODIUM: 140 mmol/L (ref 135–145)

## 2017-04-14 LAB — I-STAT BETA HCG BLOOD, ED (MC, WL, AP ONLY): I-stat hCG, quantitative: <5 m[IU]/mL (ref <5)

## 2017-04-14 LAB — CBC
HCT: 42.1 % (ref 36.0–46.0)
Hemoglobin: 13.8 g/dL (ref 12.0–15.0)
MCH: 29.4 pg (ref 26.0–34.0)
MCHC: 32.8 g/dL (ref 30.0–36.0)
MCV: 89.6 fL (ref 78.0–100.0)
PLATELETS: 251 10*3/uL (ref 150–400)
RBC: 4.7 MIL/uL (ref 3.87–5.11)
RDW: 12 % (ref 11.5–15.5)
WBC: 7.2 10*3/uL (ref 4.0–10.5)

## 2017-04-14 LAB — URINALYSIS, ROUTINE W REFLEX MICROSCOPIC
BILIRUBIN URINE: NEGATIVE
GLUCOSE, UA: NEGATIVE mg/dL
Hgb urine dipstick: NEGATIVE
KETONES UR: NEGATIVE mg/dL
Nitrite: NEGATIVE
PH: 5 (ref 5.0–8.0)
Protein, ur: NEGATIVE mg/dL
Specific Gravity, Urine: 1.025 (ref 1.005–1.030)

## 2017-04-14 LAB — CBG MONITORING, ED: Glucose-Capillary: 75 mg/dL (ref 65–99)

## 2017-04-14 MED ORDER — ACETAMINOPHEN 325 MG PO TABS
650.0000 mg | ORAL_TABLET | Freq: Once | ORAL | Status: AC
  Administered 2017-04-14: 650 mg via ORAL
  Filled 2017-04-14: qty 2

## 2017-04-14 MED ORDER — HYDROXYZINE HCL 25 MG PO TABS: 12.5000 mg | ORAL_TABLET | Freq: Four times a day (QID) | ORAL | 0 refills | Status: DC

## 2017-04-14 NOTE — ED Triage Notes (Signed)
Patient complains of 2 days of fatigue, weakness and headache that has resolved. Reports that she has anxiety and unsure if this is related. Denies pain on arrival. Alert and oriented

## 2017-04-14 NOTE — Discharge Instructions (Signed)
Please follow up with your primary care doctor in the next 2 days.  Your caregiver has diagnosed you as having chest pain that is not specific for one problem, but does not require admission.  You are at low risk for an acute heart condition or other serious illness. Chest pain comes from many different causes.  SEEK IMMEDIATE MEDICAL ATTENTION IF: You have severe chest pain, especially if the pain is crushing or pressure-like and spreads to the arms, back, neck, or jaw, or if you have sweating, nausea (feeling sick to your stomach), or shortness of breath. THIS IS AN EMERGENCY. Don't wait to see if the pain will go away. Get medical help at once. Call 911 or 0 (operator). DO NOT drive yourself to the hospital.  Your chest pain gets worse and does not go away with rest.  You have an attack of chest pain lasting longer than usual, despite rest and treatment with the medications your caregiver has prescribed.  You wake from sleep with chest pain or shortness of breath.  You feel dizzy or faint.  You have chest pain not typical of your usual pain for which you originally saw your caregiver.

## 2017-04-14 NOTE — ED Provider Notes (Signed)
[facility]-EMERGENCY DEPT Provider Note   CSN: [id number] Arrival date & time: 04/14/17  1604     History   Chief Complaint Chief Complaint  Patient presents with  . Fatigue    HPI Deborah Mahoney is a 26 y.o. female who presents to the ED with cc of jittery feeling and chest pressure. The patient states that 2 days ago she drank coffee in the morning. She developed jittery sensation in for associated chest tightness and heart beating fast. She states that she was unsure if this is secondary to her anxiety which she describes as "irritable." Patient did not drink coffee yesterday and did not have the same sensations but did have a mild headache. Today she drank coffee again and had return of her symptoms. She states that she felt extremely anxious and came to the emergency department to seek evaluation "just to make sure everything was okay." She denies any current jittery sensation, chest tightness or feelings of anxiety. She denies fevers, chills, chest pain, shortness of breath, abdominal pain, urinary symptoms.   HPI  Past Medical History:  Diagnosis Date  . Anemia   . Infection    UTI  . Seasonal allergies     Patient Active Problem List   Diagnosis Date Noted  . Migraine without status migrainosus, not intractable 02/17/2017  . Screening for cervical cancer 06/16/2016  . Amenorrhea 06/16/2016  . Generalized anxiety disorder 01/23/2016    Past Surgical History:  Procedure Laterality Date  . NO PAST SURGERIES      OB History    Gravida Para Term Preterm AB Living   SAB TAB Ectopic Multiple Live Births           1       Home Medications    Prior to Admission medications   Medication Sig Start Date End Date Taking? Authorizing Provider  acetaminophen (TYLENOL) 325 MG tablet Take 650 mg by mouth every 6 (six) hours as needed for mild pain.   Yes [provider]  ibuprofen (ADVIL,MOTRIN) 200 MG tablet Take 600 mg by mouth every 6 (six) hours as  needed for mild pain.   Yes [provider]  ondansetron (ZOFRAN ODT) 4 MG disintegrating tablet Take 1 tablet (4 mg total) by mouth every 8 (eight) hours as needed for nausea or vomiting. Patient not taking: Reported on 04/14/2017 02/15/17   Mayo, Allyn Kenner, MD    Family History Family History  Problem Relation Age of Onset  . Hypertension Mother   . Hyperlipidemia Father   . Hypertension Father   . Hearing loss Neg Hx     Social History Social History  Substance Use Topics  . Smoking status: Former Games developer  . Smokeless tobacco: Former Neurosurgeon  . Alcohol use Yes     Comment: occ stoppped with pregnancy     Allergies   Patient has no known allergies.   Review of Systems Review of Systems  Ten systems reviewed and are negative for acute change, except as noted in the HPI.   Physical Exam Updated Vital Signs BP 125/80   Pulse 64   Temp 99 F (37.2 C) (Oral)   Resp 18   SpO2 99%   Physical Exam  Constitutional: She is oriented to person, place, and time. She appears well-developed and well-nourished. No distress.  HENT:  Head: Normocephalic and atraumatic.  Eyes: Conjunctivae are normal. No scleral icterus.  Neck: Normal range of  motion.  Cardiovascular: Normal rate, regular rhythm and normal heart sounds.  Exam reveals no gallop and no friction rub.   No murmur heard. Pulmonary/Chest: Effort normal and breath sounds normal. No respiratory distress.  Abdominal: Soft. Bowel sounds are normal. She exhibits no distension and no mass. There is no tenderness. There is no guarding.  Musculoskeletal: Normal range of motion.  Neurological: She is alert and oriented to person, place, and time.  Skin: Skin is warm and dry. She is not diaphoretic.  Psychiatric: Her behavior is normal.  Nursing note and vitals reviewed.    ED Treatments / Results  Labs (all labs ordered are listed, but only abnormal results are displayed) Labs Reviewed  URINALYSIS, ROUTINE W REFLEX  MICROSCOPIC - Abnormal; Notable for the following:       Result Value   Leukocytes, UA TRACE (*)    Bacteria, UA RARE (*)    Squamous Epithelial / LPF 0-5 (*)    All other components within normal limits  BASIC METABOLIC PANEL  CBC  CBG MONITORING, ED  I-STAT BETA HCG BLOOD, ED ([facility], [facility], [facility] ONLY)    EKG  EKG Interpretation None       Radiology No results found.  Procedures Procedures (including critical care time)  Medications Ordered in ED Medications - No data to display   Initial Impression / Assessment and Plan / ED Course  I have reviewed the triage vital signs and the nursing notes.  Pertinent labs & imaging results that were available during my care of the patient were reviewed by me and considered in my medical decision making (see chart for details).     Patient with a history of anxiety, her lab work and EKG are without significant abnormality. She is perc negative. Feel that her sensations were due to caffeine sensitivity she should discontinue using them. I advised follow-up with her PCP in the next 1-2 days. Hydroxyzine for symptom control. She appears safe for discharge at this time  Final Clinical Impressions(s) / ED Diagnoses   Final diagnoses:  Jittery feeling    New Prescriptions New Prescriptions   No medications on file     Arthor Captain, PA-C 04/15/17 0981    Little, Ambrose Finland, MD 04/19/17 [phone]

## 2017-04-16 ENCOUNTER — Encounter: Payer: Self-pay | Admitting: Psychology

## 2017-04-16 ENCOUNTER — Ambulatory Visit: Payer: Medicaid Other

## 2017-04-16 ENCOUNTER — Encounter: Payer: Self-pay | Admitting: Internal Medicine

## 2017-04-16 ENCOUNTER — Ambulatory Visit: Payer: Medicaid Other | Admitting: Family Medicine

## 2017-04-16 ENCOUNTER — Ambulatory Visit (INDEPENDENT_AMBULATORY_CARE_PROVIDER_SITE_OTHER): Payer: Self-pay | Admitting: Internal Medicine

## 2017-04-16 VITALS — BP 108/68 | HR 58 | Temp 98.1°F | Ht 65.0 in | Wt 210.0 lb

## 2017-04-16 DIAGNOSIS — F411 Generalized anxiety disorder: Secondary | ICD-10-CM

## 2017-04-16 MED ORDER — CITALOPRAM HYDROBROMIDE 20 MG PO TABS: 20.0000 mg | ORAL_TABLET | Freq: Every day | ORAL | 0 refills | Status: DC

## 2017-04-16 NOTE — Progress Notes (Signed)
Dr. Cathlean Cower requested [facility] consult.   Presenting Issue: Anxiety and panic attacks  Report of symptoms: Patient reported inability to control worry across multiple domains, including, work, family and health and experiencing panic attacks. The first panic attack occurred in February 2017 and she has had several more since. Most recently she experienced chest pressure and want to the emergency room on 04/14/2017. All tests, including EKG, blood and urine work came back negative.   Duration of CURRENT symptoms: Symptoms have been present since 11/24/2012 after the death of her father but have been exacerbated since Nov 25, 2015.   Impact on function: Patient's anxiety is making it difficult for her to concentrate during work.   Psychiatric History  - Diagnoses: None - Hospitalizations:  None - Pharmacotherapy: None, but was prescribed Celexa today by PCP and will begin taking  - Outpatient therapy: None  Family history of psychiatric issues:None  Current and history of substance use: Drinks maybe 1-2 drinks monthly and denied all other substance use.    Patient denied current depressive symptoms and suicidal ideation per Dr. Cathlean Cower.   GAD7: 14

## 2017-04-16 NOTE — Assessment & Plan Note (Signed)
Assessment/Plan Recommendations:  Patient was distressed over her symptoms and was tearful on several occasions describing her anxiety and difficulty coping with it.   [facility] provided basic psychoeducation around anxiety and panic attacks and described psychological interventions. [facility] introduced patient to deep breathing and patient will do this twice daily. Patient will follow up with [facility] in 2 weeks.   Warm hand-off complete.

## 2017-04-16 NOTE — Patient Instructions (Signed)
I want you to try Celexa for the next couple weeks. I want you to follow-up with me regarding anxiety in 2 weeks and at that time we will see how you are feeling

## 2017-04-16 NOTE — Progress Notes (Signed)
   [facility] Deborah Chars, MD Phone: [phone]  Reason For Visit: Visit for Anxiety   #GAD Symptoms: Patient states that anxiety started in 2014. Patient was first seen in 2017 and after discussion with Dr. Kennon Rounds started exercise which helped significantly. Patient finds herself easily stressed. Normal life things stress her out.  Age of onset of first mood disturbance:[age] Duration of CURRENT symptoms:Patient started having symptom of anxiety on Monday. Patient was stressed out about getting daughter in daycare. Bills. Etc. Impact on function:Patient feels physical sick from it.   Psychiatric History - Diagnoses: GAD  - Hospitalizations:None  - Pharmacotherapy: Did not take the zoloft, yesterday patient took the hydroxyzine  - Outpatient therapy:None   Family history of psychiatric issues:Grandmother and Father  Current and history of substance use: None, occasional drink Medical conditions that might explain or contribute to symptoms: None   PHQ-9: Denies any depression or SI  GAD7:14  Past Medical History Reviewed problem list.  Medications- reviewed and updated No additions to family history Social history- patient is a non- smoker  Objective: BP 108/68   Pulse (!) 58   Temp 98.1 F (36.7 C) (Oral)   Ht  (1.651 m)   Wt 210 lb (95.3 kg)   SpO2 99%   BMI 34.95 kg/m  Gen: NAD, alert, cooperative with exam Extremities: warm, well perfused, No edema, cyanosis or clubbing;  MSK: Normal gait and station Skin: dry, intact, no rashes or lesions   Assessment/Plan: See problem based a/p  Generalized anxiety disorder GAD7- 14 - Start patient on Celexa -Patient to see behavioral medicine for counseling techniques -Follow up in 2 weeks

## 2017-04-19 ENCOUNTER — Ambulatory Visit: Payer: Medicaid Other | Admitting: Internal Medicine

## 2017-04-20 NOTE — Assessment & Plan Note (Signed)
GAD7- 14 - Start patient on Celexa -Patient to see behavioral medicine for counseling techniques -Follow up in 2 weeks

## 2017-04-30 ENCOUNTER — Ambulatory Visit: Payer: Medicaid Other

## 2017-05-07 ENCOUNTER — Ambulatory Visit (INDEPENDENT_AMBULATORY_CARE_PROVIDER_SITE_OTHER): Payer: Self-pay | Admitting: *Deleted

## 2017-05-07 ENCOUNTER — Telehealth: Payer: Self-pay | Admitting: Psychology

## 2017-05-07 DIAGNOSIS — Z23 Encounter for immunization: Secondary | ICD-10-CM

## 2017-05-07 NOTE — Telephone Encounter (Signed)
[facility] to follow-up on missed appointment last Friday and attempt to reschedule but was unable to reach patient. There was no voicemail so no message was left.

## 2017-05-28 IMAGING — US US ART/VEN ABD/PELV/SCROTUM DOPPLER LTD
1 series · 15 of 25 positions shown · non-contrast
Comparison: None.

CLINICAL DATA: Left lower quadrant pain

EXAM:
TRANSABDOMINAL AND TRANSVAGINAL ULTRASOUND OF PELVIS
DOPPLER ULTRASOUND OF OVARIES
TECHNIQUE: Both transabdominal and transvaginal ultrasound examinations of the
pelvis were performed. Transabdominal technique was performed for
global imaging of the pelvis including uterus, ovaries, adnexal
regions, and pelvic cul-de-sac.
It was necessary to proceed with endovaginal exam following the
transabdominal exam to visualize the endometrium and ovaries. Color
and duplex Doppler ultrasound was utilized to evaluate blood flow to
the ovaries.

[Series 1: us art/ven abd/pelv/scrotum doppler ltd · 90 acquisitions, 15 frames shown]
[im 1/90]
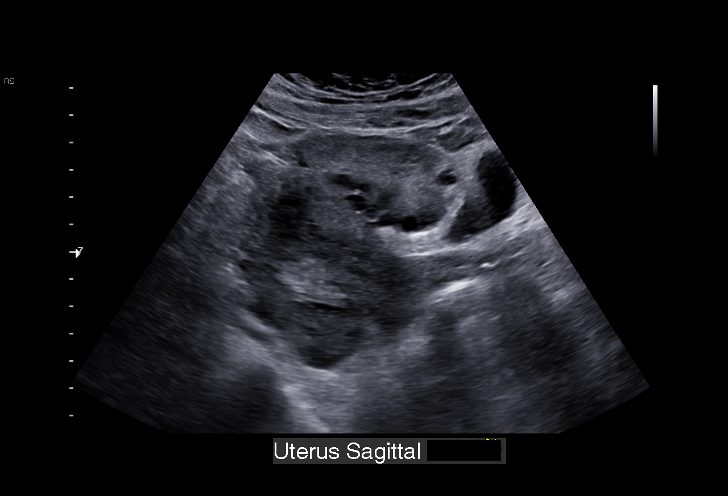
[im 8/90]
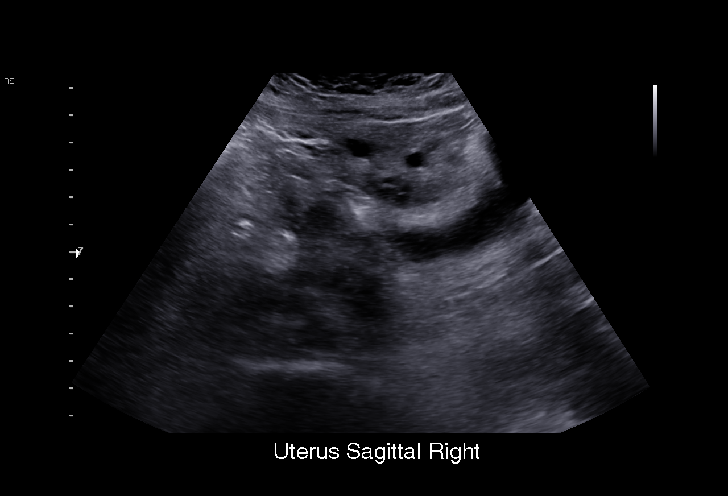
[im 15/90]
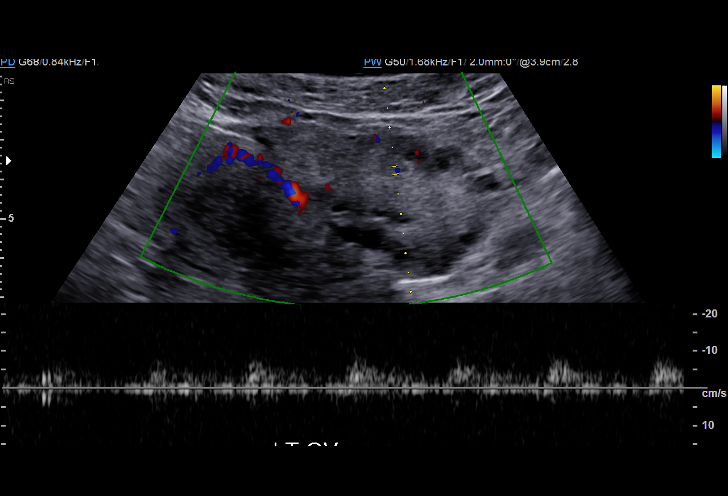
[im 19/90]
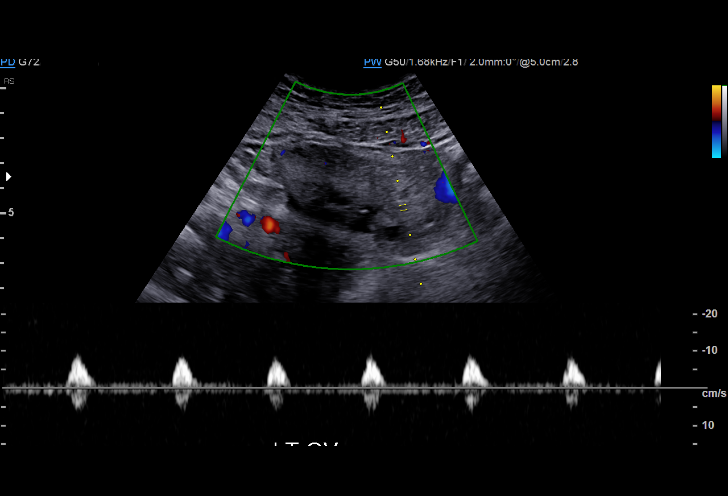
[im 26/90]
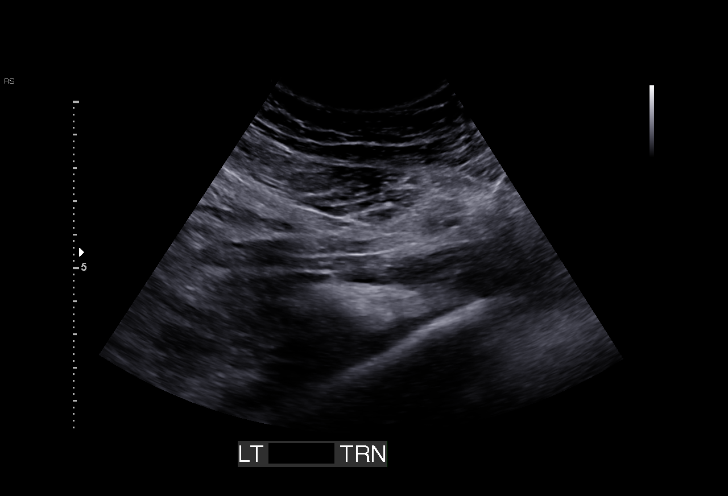
[im 34/90]
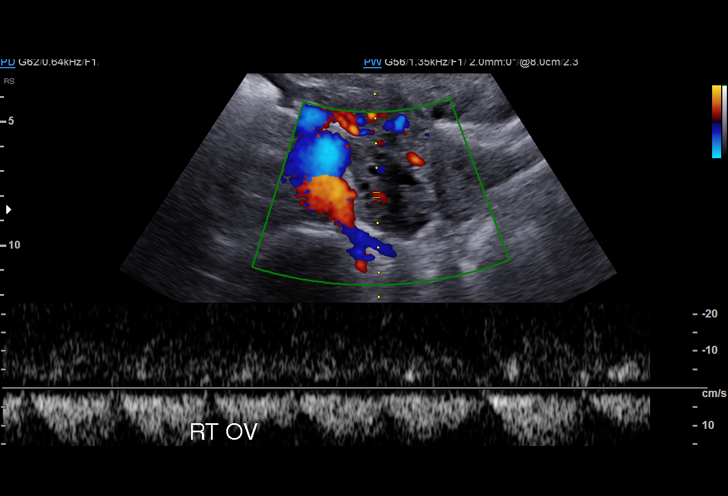
[im 38/90]
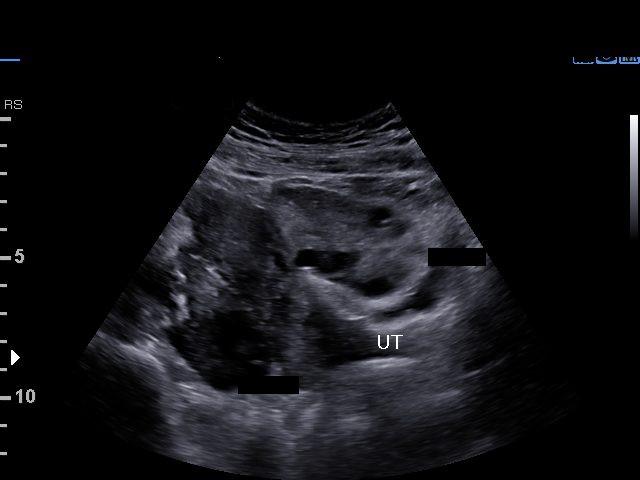
[im 45/90]
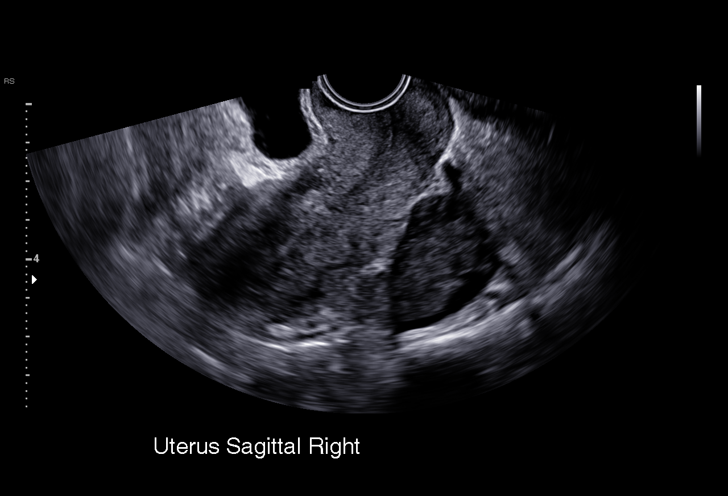
[im 52/90]
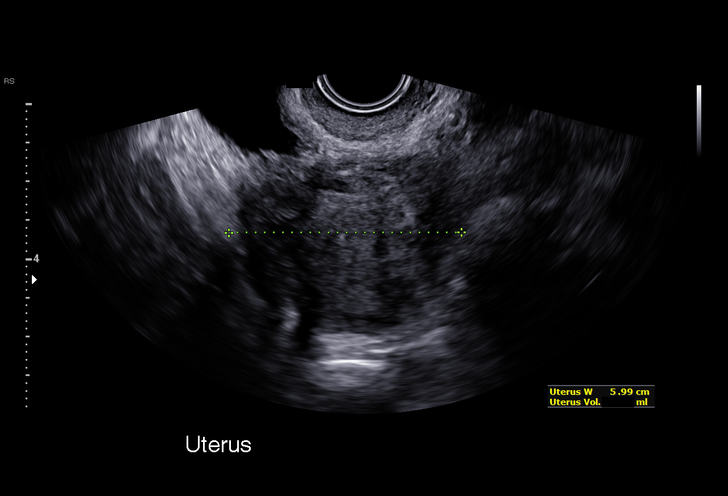
[im 56/90]
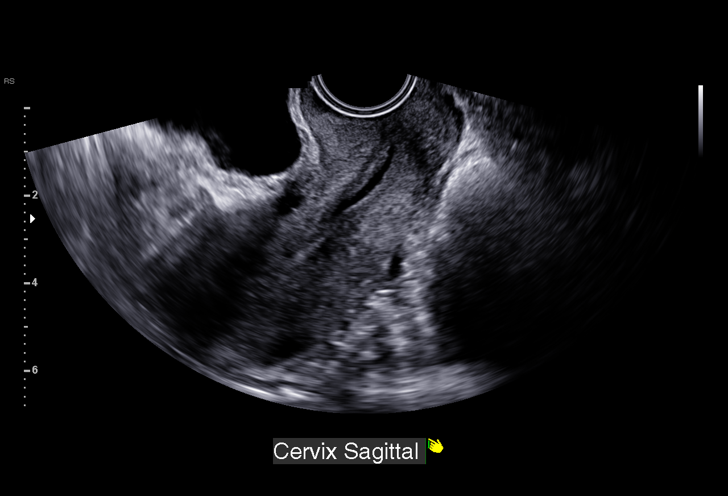
[im 64/90]
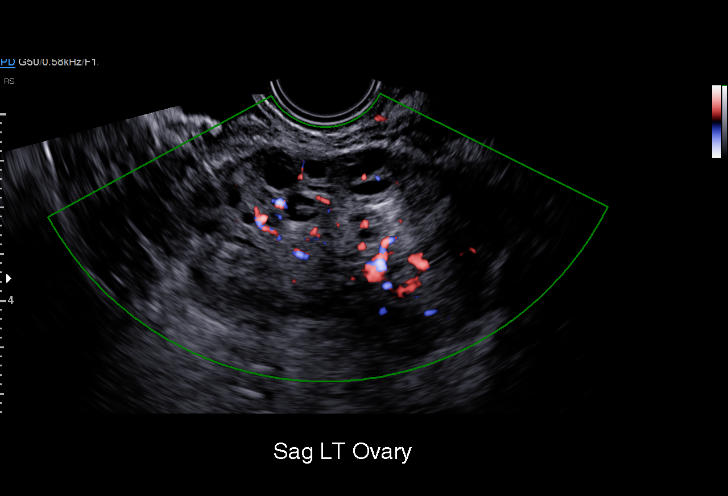
[im 71/90]
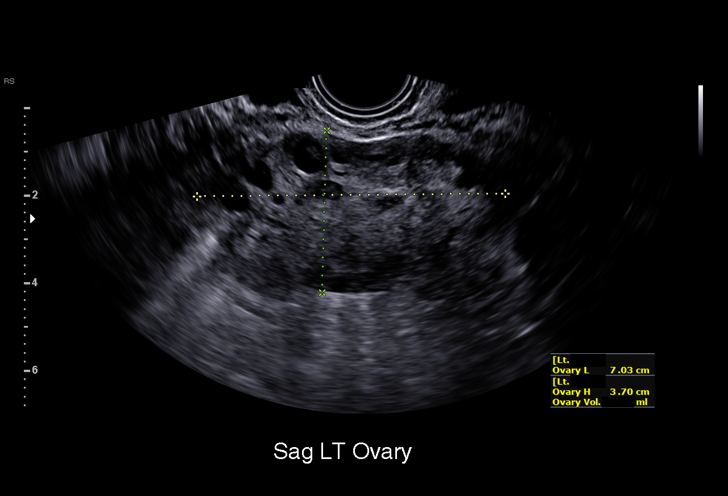
[im 75/90]
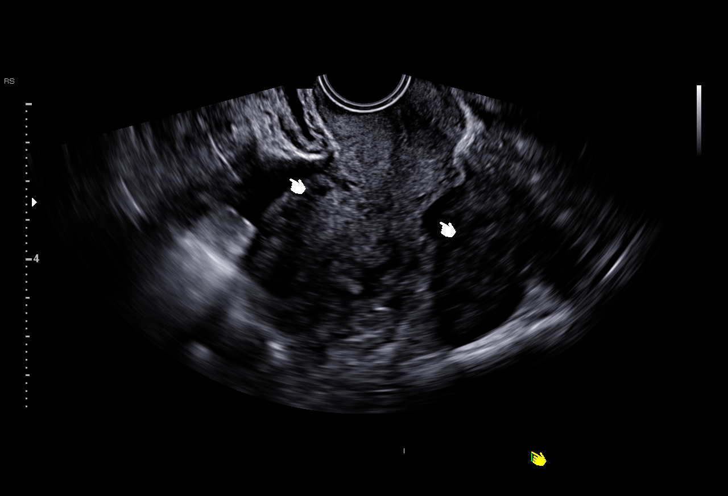
[im 82/90]
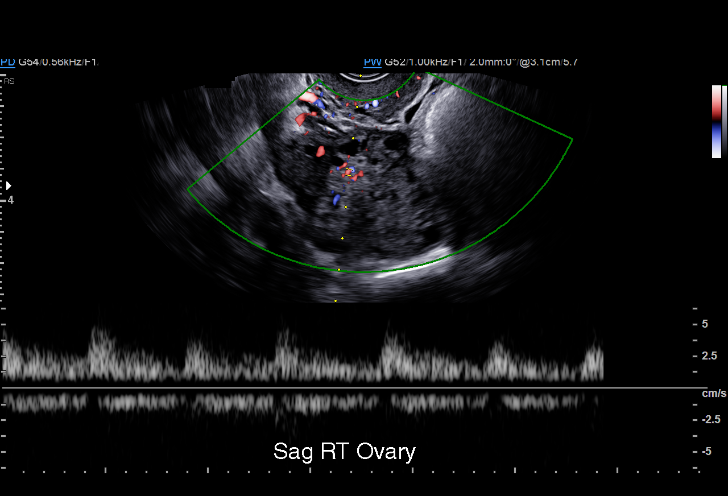
[im 90/90]
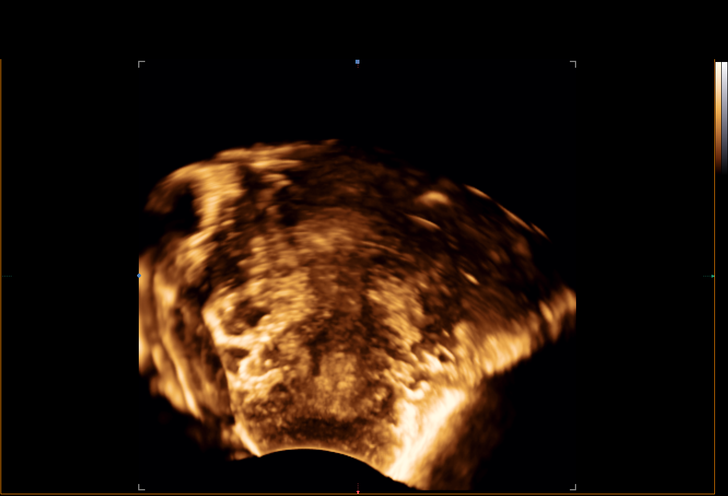

[15 of 25 positions shown; findings below may reference images not displayed]

FINDINGS: Uterus

Measurements: 8.3 x 3.7 x 6.0 cm. No fibroids or other mass
visualized.

Endometrium

Thickness: 6 mm in thickness.  No focal abnormality visualized.

Right ovary

Measurements: 6.0 x 3.8 x 3.2 cm, appears prominent. No focal
abnormality or adnexal mass.

Left ovary

Measurements: 7.0 x 3.7 x 4.2 cm, appears prominent. No focal
abnormality or adnexal mass.

Pulsed Doppler evaluation of both ovaries demonstrates normal
low-resistance arterial and venous waveforms.

Other findings

Small amount of free fluid in the pelvis.
IMPRESSION: Prominent/enlarged ovaries bilaterally without focal abnormality or
torsion.

## 2017-07-23 ENCOUNTER — Ambulatory Visit: Payer: Self-pay | Admitting: Internal Medicine

## 2017-07-29 ENCOUNTER — Encounter: Payer: Self-pay | Admitting: Student

## 2017-07-29 ENCOUNTER — Ambulatory Visit (INDEPENDENT_AMBULATORY_CARE_PROVIDER_SITE_OTHER): Payer: Self-pay | Admitting: Student

## 2017-07-29 VITALS — BP 110/80 | HR 67 | Temp 97.6°F | Ht 65.0 in | Wt 211.0 lb

## 2017-07-29 DIAGNOSIS — Z3202 Encounter for pregnancy test, result negative: Secondary | ICD-10-CM

## 2017-07-29 DIAGNOSIS — R399 Unspecified symptoms and signs involving the genitourinary system: Secondary | ICD-10-CM

## 2017-07-29 DIAGNOSIS — R319 Hematuria, unspecified: Secondary | ICD-10-CM

## 2017-07-29 LAB — POCT URINALYSIS DIP (MANUAL ENTRY)
Bilirubin, UA: NEGATIVE
Glucose, UA: NEGATIVE mg/dL
Ketones, POC UA: NEGATIVE mg/dL
Leukocytes, UA: NEGATIVE
Nitrite, UA: NEGATIVE
Protein Ur, POC: NEGATIVE mg/dL
Spec Grav, UA: >=1.03 — AB (ref 1.010–1.025)
Urobilinogen, UA: 0.2 E.U./dL
pH, UA: 6 (ref 5.0–8.0)

## 2017-07-29 LAB — POCT URINE PREGNANCY: PREG TEST UR: NEGATIVE

## 2017-07-29 NOTE — Progress Notes (Signed)
  Subjective:    Deborah Mahoney is a 27 y.o. old female here "UTI"  HPI "UTI": Symptoms include dysuria that she describes as achy pain (not burning) with urination about week ago. She has been pain free for the last 3 days. She also had increased freq, urge, nausea and back pain at that time. She thinks her symptoms are also due to drinking a lot of soda. Her symptoms have resolved completely. Denies hematuria, vaginal discharge, bleeding or trauma. She had similar symptoms when she had UTI about 6 months. No fever. No history of renal stone. She has been using crane berry juice with water. LMP 06/09/2017. Never been irregular. Off birth control for a year and half. Last sexual intercourse three days ago.   PMH/Problem List: has Generalized anxiety disorder; Screening for cervical cancer; Amenorrhea; and Migraine without status migrainosus, not intractable on their problem list.   has a past medical history of Anemia, Infection, and Seasonal allergies.  FH:  Family History  Problem Relation Age of Onset  . Hypertension Mother   . Hyperlipidemia Father   . Hypertension Father   . Hearing loss Neg Hx     SH Social History   Tobacco Use  . Smoking status: Former Games developermoker  . Smokeless tobacco: Former Engineer, waterUser  Substance Use Topics  . Alcohol use: Yes    Comment: occ stoppped with pregnancy  . Drug use: Yes    Types: Marijuana    Comment: stopped when found out pregnant    Review of Systems Review of systems negative except for pertinent positives and negatives in history of present illness above.     Objective:     Vitals:   07/29/17 1101  BP: 110/80  Pulse: 67  Temp: 97.6 F (36.4 C)  TempSrc: Oral  SpO2: 97%  Weight: 211 lb (95.7 kg)  Height: 5\' 5"  (1.651 m)   Body mass index is 35.11 kg/m.  Physical Exam  GEN: appears well, no apparent distress. CVS: RRR, nl s1 & s2 RESP: no IWOB GU: no suprapubic or CVA tenderness MSK: no focal tenderness or notable swelling SKIN: no  apparent skin lesion NEURO: alert and oiented appropriately, no gross deficits  PSYCH: euthymic mood with congruent affect    Assessment and Plan:  1. UTI symptoms:  UA with trace blood.  Based on her symptomatology and her UA, I suspect renal stone. She may have already passed it. Symptoms resolved as of 3 days ago. She says she had been drinking a lot of soda which increases her risk of renal stone. It could also cause bladder irritation. No constitutional symptoms, suprapubic or CVA tenderness to think of pyelonephritis.  Discussed the urinalysis results with the patient.  Recommended stopping soda.  Recommended good hydration.  Gave her handout about hematuria.  2. Hematuria, unspecified type: Likely due to renal stone. Plan as above.   Return if symptoms worsen or fail to improve.  Almon Herculesaye T Nohealani Medinger, MD 07/29/17 Pager: [phone]

## 2017-07-29 NOTE — Patient Instructions (Addendum)
It was great seeing you today! We have addressed the following issues today -Pain with urination: Your urine test did not show infection.  There is a little bit of blood. Not sure about the cause of the blood. See below for more information on this. Please come back and see [place] if your symptoms return.  -Urine pregnancy test is negative.  -I recommend you take folic acid or multivitamin over-the-counter if you are planning to be pregnant.    If we did any lab work today, and the results require attention, either me or my nurse will get in touch with you. If everything is normal, you will get a letter in mail and a message via . If you don't hear from [place] in two weeks, please give [place] a call. Otherwise, we look forward to seeing you again at your next visit. If you have any questions or concerns before then, please call the clinic at [phone].  Please bring all your medications to every doctors visit  Sign up for My Chart to have easy access to your labs results, and communication with your Primary care physician.    Please check-out at the front desk before leaving the clinic.    Take Care,   Dr. Alanda Slim   Hematuria, Adult Hematuria is blood in your urine. It can be caused by a bladder infection, kidney infection, prostate infection, kidney stone, or cancer of your urinary tract. Infections can usually be treated with medicine, and a kidney stone usually will pass through your urine. If neither of these is the cause of your hematuria, further workup to find out the reason may be needed. It is very important that you tell your health care provider about any blood you see in your urine, even if the blood stops without treatment or happens without causing pain. Blood in your urine that happens and then stops and then happens again can be a symptom of a very serious condition. Also, pain is not a symptom in the initial stages of many urinary cancers. Follow these instructions at home:  Drink  lots of fluid, 3-4 quarts a day. If you have been diagnosed with an infection, cranberry juice is especially recommended, in addition to large amounts of water.  Avoid caffeine, tea, and carbonated beverages because they tend to irritate the bladder.  Avoid alcohol because it may irritate the prostate.  Take all medicines as directed by your health care provider.  If you were prescribed an antibiotic medicine, finish it all even if you start to feel better.  If you have been diagnosed with a kidney stone, follow your health care provider's instructions regarding straining your urine to catch the stone.  Empty your bladder often. Avoid holding urine for long periods of time.  After a bowel movement, women should cleanse front to back. Use each tissue only once.  Empty your bladder before and after sexual intercourse if you are a female. Contact a health care provider if:  You develop back pain.  You have a fever.  You have a feeling of sickness in your stomach (nausea) or vomiting.  Your symptoms are not better in 3 days. Return sooner if you are getting worse. Get help right away if:  You develop severe vomiting and are unable to keep the medicine down.  You develop severe back or abdominal pain despite taking your medicines.  You begin passing a large amount of blood or clots in your urine.  You feel extremely weak or faint,  or you pass out. This information is not intended to replace advice given to you by your health care provider. Make sure you discuss any questions you have with your health care provider. Document Released: 06/29/2005 Document Revised: 12/05/2015 Document Reviewed: 02/27/2013 Elsevier Interactive Patient Education  2017 [employer]

## 2017-08-24 ENCOUNTER — Ambulatory Visit (INDEPENDENT_AMBULATORY_CARE_PROVIDER_SITE_OTHER): Payer: 59 | Admitting: Family Medicine

## 2017-08-24 ENCOUNTER — Encounter: Payer: Self-pay | Admitting: Family Medicine

## 2017-08-24 ENCOUNTER — Other Ambulatory Visit: Payer: Self-pay

## 2017-08-24 VITALS — BP 104/72 | HR 67 | Temp 98.2°F | Ht 65.0 in | Wt 215.4 lb

## 2017-08-24 DIAGNOSIS — R3989 Other symptoms and signs involving the genitourinary system: Secondary | ICD-10-CM | POA: Diagnosis not present

## 2017-08-24 DIAGNOSIS — N912 Amenorrhea, unspecified: Secondary | ICD-10-CM

## 2017-08-24 DIAGNOSIS — M545 Low back pain, unspecified: Secondary | ICD-10-CM | POA: Insufficient documentation

## 2017-08-24 DIAGNOSIS — Z3202 Encounter for pregnancy test, result negative: Secondary | ICD-10-CM

## 2017-08-24 LAB — POCT UA - MICROSCOPIC ONLY

## 2017-08-24 LAB — POCT URINE PREGNANCY: PREG TEST UR: NEGATIVE

## 2017-08-24 LAB — POCT URINALYSIS DIP (MANUAL ENTRY)
BILIRUBIN UA: NEGATIVE
Glucose, UA: NEGATIVE mg/dL
Ketones, POC UA: NEGATIVE mg/dL
LEUKOCYTES UA: NEGATIVE
NITRITE UA: NEGATIVE
PH UA: 5.5 (ref 5.0–8.0)
PROTEIN UA: NEGATIVE mg/dL
Spec Grav, UA: >=1.03 — AB (ref 1.010–1.025)
UROBILINOGEN UA: 0.2 U/dL

## 2017-08-24 NOTE — Patient Instructions (Signed)
Thank you for coming in to see Deborah Mahoney today. Please see below to review our plan for today's visit.  1.  Your symptoms are unlikely to be related to a urinary infection. 2.  Continue taking the ibuprofen with Tylenol every 8 hours as needed for pain.  Be sure to stand while working with a comfortable pad and shoe wear.  I believe your pain is likely musculoskeletal related.  If you continue having pain beyond 1 month, return to the clinic for reevaluation.  Please call the clinic at [phone] if your symptoms worsen or you have any concerns. It was our pleasure to serve you.  Durward Parcelavid Alleyah Twombly, DO [facility], PGY-2

## 2017-08-24 NOTE — Assessment & Plan Note (Addendum)
Acute.  No history of trauma.  Seems to be bilateral without sciatica.  Primarily affects PSIS of hip.  Given complete resolution with NSAIDs, low suspicion for urinary infection, or pyelonephritis.  UA and microscopy without signs of acute cystitis or hematuria. 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

## 2017-08-24 NOTE — Progress Notes (Signed)
   Subjective   Patient ID: Deborah Mahoney    DOB: April 09, 1991, 27 y.o. female   MRN: [id number]  CC: "UTI"  HPI: Deborah Mahoney is a 27 y.o. female who presents for a same day appointment for the following:  BACK PAIN  Onset: 2 weeks ago without history of trauma Pain characteristic: aching bilateral distribution primarily at hips Patient has tried: Ibuprofen with complete relief Pain radiates: No History of trauma or injury: No Prior history of similar pain: No History of cancer: No Weak immune system: No History of IV drug use: No History of steroid use: No  Symptoms Incontinence of bowel or bladder: No Saddle anesthesia: No Radiculopathy: No Fever: No Rest or nocturnal pain: No Weight Loss: No Rash: No  Patient was concerned she may have a UTI.  She has a history of UTIs but denies dysuria or polyuria.  She was seen 2 weeks ago at the clinic was told she may have a kidney stone given blood on the UA.  ROS: see HPI for pertinent.  PMFSH: Anxiety, migraines, AUB.  Surgical history unremarkable.  Family history HTN, HLD. Smoking status reviewed. Medications reviewed.  Objective   BP 104/72   Pulse 67   Temp 98.2 F (36.8 C) (Oral)   Ht 5\' 5"  (1.651 m)   Wt 215 lb 6.4 oz (97.7 kg)   SpO2 98%   BMI 35.84 kg/m  Vitals and nursing note reviewed.  General: well nourished, well developed, NAD with non-toxic appearance HEENT: normocephalic, atraumatic, moist mucous membranes Cardiovascular: regular rate and rhythm without murmurs, rubs, or gallops Lungs: clear to auscultation bilaterally with normal work of breathing Abdomen: soft, non-tender, non-distended, normoactive bowel sounds Skin: warm, dry, no rashes or lesions, cap refill < 2 seconds Extremities: warm and well perfused, normal tone, no edema MSK: normal gait, minimal bilateral tenderness at hips without tenderness at paraspinal muscles or central tenderness  Assessment & Plan   Acute bilateral low back  pain without sciatica Acute.  No history of trauma.  Seems to be bilateral without sciatica.  Primarily affects PSIS of hip.  Given complete resolution with NSAIDs, low suspicion for urinary infection, or pyelonephritis.  UA and microscopy without signs of acute cystitis or hematuria. 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 POCT urinalysis dipstick  . POCT urine pregnancy  . POCT UA - Microscopic Only   No orders of the defined types were placed in this encounter.   Durward Parcelavid Pamalee Marcoe, DO [facility], PGY-2 08/24/2017, 12:08 PM

## 2017-11-19 ENCOUNTER — Encounter: Payer: Self-pay | Admitting: Family Medicine

## 2017-11-19 ENCOUNTER — Other Ambulatory Visit: Payer: Self-pay

## 2017-11-19 ENCOUNTER — Ambulatory Visit (INDEPENDENT_AMBULATORY_CARE_PROVIDER_SITE_OTHER): Payer: Self-pay | Admitting: Family Medicine

## 2017-11-19 VITALS — BP 100/72 | HR 64 | Temp 98.2°F | Ht 65.0 in | Wt 211.6 lb

## 2017-11-19 DIAGNOSIS — R110 Nausea: Secondary | ICD-10-CM

## 2017-11-19 DIAGNOSIS — G43709 Chronic migraine without aura, not intractable, without status migrainosus: Secondary | ICD-10-CM

## 2017-11-19 DIAGNOSIS — J302 Other seasonal allergic rhinitis: Secondary | ICD-10-CM

## 2017-11-19 LAB — POCT URINE PREGNANCY: Preg Test, Ur: NEGATIVE

## 2017-11-19 MED ORDER — LORATADINE-PSEUDOEPHEDRINE ER 10-240 MG PO TB24: 1.0000 | ORAL_TABLET | Freq: Every day | ORAL | 1 refills | Status: DC

## 2017-11-19 NOTE — Patient Instructions (Addendum)
It was great to meet you today! Thank you for letting me participate in your care!  Today, we discussed your recent nausea. It most likely due to your chronic migraine headaches as many people who get migraine headaches develop nausea. Just to be sure, I will call you with the results of your urine pregnancy test.  I also have prescribed you Claritin-D for your congestion and allergies. Please take these everyday, especially now during allergy season.  Please return to the clinic if your symptoms worsen or do not improve.  Be well, Jules Schick, DO PGY-1, [facility]

## 2017-11-19 NOTE — Progress Notes (Signed)
Subjective: Chief Complaint  Patient presents with  . Headache  . Nausea     HPI: Deborah Mahoney is a 27 y.o. presenting to clinic today to discuss the following:  1 day of nausea Patient states one day of nausea that started this morning. She also awoke with an intense headache. She has a history of migraine headaches and states this one feels like previous migraine headaches but the nausea is new. She has been having migraine headaches for about one year. Usually in the right frontal area and associated with inability to concentrate or function. Loud noises and bright light make it worse. Alleve and laying down in a dark room make it worse. No association with smells and does not wake her from sleep.   Denies double vision, blurry vision, loss of balance or instability when walking, no abdominal pain, fever, cough rhinorrhea, vomiting, diarrhea, or abnormal bleeding. Endorses some congestion.  Health Maintenance: none     ROS noted in HPI.   Past Medical, Surgical, Social, and Family History Reviewed & Updated per EMR.   Pertinent Historical Findings include:   Social History   Tobacco Use  Smoking Status Former Smoker  Smokeless Tobacco Former Neurosurgeon    Objective: BP 100/72   Pulse 64   Temp 98.2 F (36.8 C) (Oral)   Ht  (1.651 m)   Wt 211 lb 9.6 oz (96 kg)   SpO2 97%   BMI 35.21 kg/m  Vitals and nursing notes reviewed  Physical Exam  Constitutional: She is oriented to person, place, and time. She appears well-developed and well-nourished. She does not appear ill. No distress.  HENT:  Head: Normocephalic and atraumatic.  Mouth/Throat: Oropharynx is clear and moist.  Eyes: Pupils are equal, round, and reactive to light. EOM are normal.  Neck: Normal range of motion. Neck supple.  Cardiovascular: Normal rate, regular rhythm, normal heart sounds and intact distal pulses.  Pulmonary/Chest: Effort normal and breath sounds normal.  Abdominal: Soft. Bowel  sounds are normal.  Musculoskeletal: Normal range of motion. She exhibits no edema.  Lymphadenopathy:    She has no cervical adenopathy.  Neurological: She is alert and oriented to person, place, and time. She has normal strength. She displays normal reflexes. No cranial nerve deficit. Coordination and gait normal.  Skin: Skin is warm and dry. Capillary refill takes less than 2 seconds.   No results found for this or any previous visit (from the past 72 hour(s)).  Assessment/Plan:  Seasonal allergies Claritin-D as she has symptoms and history of seasonal allergies with exacerbations around this time of year in the past.   The pseudoephedrine should help her congestion.  Nausea without vomiting Obtaining urine pregnancy test due to morning nausea to rule out pregnancy as patient states she could not be sure she was not pregnant.  Migraine without status migrainosus, not intractable Cont current regimen of Tylenol and Ibuprofen as needed as migraines are not changing in quantity or quality.   Instructed patient to return if symptoms do not improve with rest and medication. If her symptoms do return I would consider starting her on beta blocker and also giving her a prescription for sumatriptan to use as needed.  PATIENT EDUCATION PROVIDED: See AVS    Diagnosis and plan along with any newly prescribed medication(s) were discussed in detail with this patient today. The patient verbalized understanding and agreed with the plan. Patient advised if symptoms worsen return to clinic or ER.  Health Maintainance:   Orders Placed This Encounter  Procedures  . POCT urine pregnancy    Meds ordered this encounter  Medications  . loratadine-pseudoephedrine (CLARITIN-D 24 HOUR) 10-240 MG 24 hr tablet    Sig: Take 1 tablet by mouth daily.    Dispense:  30 tablet    Refill:  1     Tim Karen Chafe, DO 11/19/2017, 2:46 PM PGY-1, [facility]

## 2017-11-22 ENCOUNTER — Encounter: Payer: Self-pay | Admitting: Family Medicine

## 2017-11-22 ENCOUNTER — Telehealth: Payer: Self-pay | Admitting: Family Medicine

## 2017-11-22 NOTE — Progress Notes (Signed)
Reprinting letter for patient with correct date

## 2017-11-22 NOTE — Telephone Encounter (Signed)
Pt needs a new doctors note from her visit with Lockamy last Friday. The note has the wrong year on it for when she can return back to work. The notes says 11/21/2016 and it's supposed to say 11/21/2017. The corrected letter needs to be faxed to [phone] at ATTN [place]. Please advise

## 2017-11-22 NOTE — Telephone Encounter (Signed)
Will forward to Dr. Karen Chafe who saw patient. Jazmin Hartsell,CMA

## 2017-11-23 ENCOUNTER — Telehealth: Payer: Self-pay | Admitting: Family Medicine

## 2017-11-23 NOTE — Telephone Encounter (Signed)
Please refax the letter as it has still not been received by [employer], fax # [phone]

## 2017-11-23 NOTE — Assessment & Plan Note (Signed)
Obtaining urine pregnancy test due to morning nausea to rule out pregnancy as patient states she could not be sure she was not pregnant.

## 2017-11-23 NOTE — Assessment & Plan Note (Signed)
Cont current regimen of Tylenol and Ibuprofen as needed as migraines are not changing in quantity or quality.   Instructed patient to return if symptoms do not improve with rest and medication. If her symptoms do return I would consider starting her on beta blocker and also giving her a prescription for sumatriptan to use as needed.

## 2017-11-23 NOTE — Assessment & Plan Note (Signed)
Claritin-D as she has symptoms and history of seasonal allergies with exacerbations around this time of year in the past.   The pseudoephedrine should help her congestion.

## 2017-11-23 NOTE — Telephone Encounter (Signed)
Will forward to admin team to pull fax from yesterday and resend it.  Jazmin Hartsell,CMA

## 2017-12-14 ENCOUNTER — Telehealth: Payer: Self-pay | Admitting: Family Medicine

## 2017-12-14 NOTE — Telephone Encounter (Signed)
Pt would like someone to call her back and discuss an issue she's been having. She's not sure if it's just indigestion or something else, but it's very uncomfortable.

## 2017-12-15 ENCOUNTER — Ambulatory Visit (INDEPENDENT_AMBULATORY_CARE_PROVIDER_SITE_OTHER): Payer: Self-pay | Admitting: Family Medicine

## 2017-12-15 ENCOUNTER — Other Ambulatory Visit: Payer: Self-pay

## 2017-12-15 ENCOUNTER — Encounter: Payer: Self-pay | Admitting: Family Medicine

## 2017-12-15 DIAGNOSIS — R0989 Other specified symptoms and signs involving the circulatory and respiratory systems: Secondary | ICD-10-CM

## 2017-12-15 NOTE — Assessment & Plan Note (Signed)
Unsure of cause.  Perhaps mild abrasion.  No signs of retained fb or infection or reflux or neck mass.   Since is not causing distress or any ingestion limitations will observe.  If persists or worsens may need endoscopy

## 2017-12-15 NOTE — Progress Notes (Signed)
Subjective  Deborah Mahoney is a 27 y.o. female is presenting with the following  SENSATION IN THROAT Four days ago felt a sensation like something was in her throat while eating [place].  No pain or trouble swallowing or shortness of breath or neck masses.  Tired to make her self throw up but did not help.  Taking prilosec for 2 days but no change.  Not getting worse or better  Chief Complaint noted Review of Symptoms - see HPI   PMH - Smoking status noted.  No etoh   Objective Vital Signs reviewed BP 112/72   Pulse 60   Temp (!) 97.5 F (36.4 C) (Oral)   Ht 5\' 5"  (1.651 m)   Wt 210 lb (95.3 kg)   SpO2 98%   BMI 34.95 kg/m  Alert NAD Neck:  No deformities, thyromegaly, masses, or tenderness noted.   Supple with full range of motion without pain. Throat: normal mucosa, no exudate, uvula midline, no redness Mouth - no lesions, mucous membranes are moist, no decaying teeth   Lungs:  Normal respiratory effort, chest expands symmetrically. Lungs are clear to auscultation, no crackles or wheezes. Heart - Regular rate and rhythm.  No murmurs, gallops or rubs.       Assessments/Plans  See after visit summary for details of patient instuctions  Foreign body sensation in throat Unsure of cause.  Perhaps mild abrasion.  No signs of retained fb or infection or reflux or neck mass.   Since is not causing distress or any ingestion limitations will observe.  If persists or worsens may need endoscopy

## 2017-12-15 NOTE — Patient Instructions (Signed)
Good to see you today!  Thanks for coming in.  I think this is a mild abrasion scratch Give it 2 weeks to improve if not then come back and we may need to refer you for endoscopy  If it is getting worse - fever or pain or food sticking then come back before  Gargle with salt water

## 2017-12-15 NOTE — Telephone Encounter (Signed)
Pt is coming in for an apt @11 :30am to be evaluated.

## 2018-01-03 ENCOUNTER — Ambulatory Visit (INDEPENDENT_AMBULATORY_CARE_PROVIDER_SITE_OTHER): Payer: Medicaid Other | Admitting: Internal Medicine

## 2018-01-03 VITALS — BP 112/80 | HR 78 | Temp 98.2°F

## 2018-01-03 DIAGNOSIS — R21 Rash and other nonspecific skin eruption: Secondary | ICD-10-CM

## 2018-01-03 NOTE — Patient Instructions (Signed)
I believe your symptoms were likely related to a contact dermatitis from residue in your washing machine. I would recommend cleaning your machine and then rewashing with your laundry detergent. I would recommend having some Benadryl and Hydrocortisone cream on hand if you have any new itchy spots. Please seek care if you have swelling of your tongue, itching in your throat, throat closing up, or trouble breathing.    Contact Dermatitis Dermatitis is redness, soreness, and swelling (inflammation) of the skin. Contact dermatitis is a reaction to certain substances that touch the skin. You either touched something that irritated your skin, or you have allergies to something you touched. Follow these instructions at home: Skin Care  Moisturize your skin as needed.  Apply cool compresses to the affected areas.  Try taking a bath with: ? Epsom salts. Follow the instructions on the package. You can get these at a pharmacy or grocery store. ? Baking soda. Pour a small amount into the bath as told by your doctor. ? Colloidal oatmeal. Follow the instructions on the package. You can get this at a pharmacy or grocery store.  Try applying baking soda paste to your skin. Stir water into baking soda until it looks like paste.  Do not scratch your skin.  Bathe less often.  Bathe in lukewarm water. Avoid using hot water. Medicines  Take or apply over-the-counter and prescription medicines only as told by your doctor. General instructions  Keep all follow-up visits as told by your doctor. This is important.  Avoid the substance that caused your reaction. If you do not know what caused it, keep a journal to try to track what caused it. Write down: ? What you eat. ? What cosmetic products you use. ? What you drink. ? What you wear in the affected area. This includes jewelry.  If you were given a bandage (dressing), take care of it as told by your doctor. This includes when to change and remove  it. Contact a doctor if:  You do not get better with treatment.  Your condition gets worse.  You have signs of infection such as: ? Swelling. ? Tenderness. ? Redness. ? Soreness. ? Warmth.  You have a fever.  You have new symptoms. Get help right away if:  You have a very bad headache.  You have neck pain.  Your neck is stiff.  You throw up (vomit).  You feel very sleepy.  You see red streaks coming from the affected area.  Your bone or joint underneath the affected area becomes painful after the skin has healed.  The affected area turns darker.  You have trouble breathing. This information is not intended to replace advice given to you by your health care provider. Make sure you discuss any questions you have with your health care provider. Document Released: 04/26/2009 Document Revised: 12/05/2015 Document Reviewed: 11/14/2014 Elsevier Interactive Patient Education  2018 [employer]

## 2018-01-03 NOTE — Progress Notes (Signed)
   Subjective:    [name] - 27 y.o. female MRN [id number]  Date of birth: 13-Aug-1990  HPI  [name] is here for rash.  RASH  Had rash since this morning.  Location: started with swelling of her top lip then noticed red welts on her right wrist, back of her thighs, and hips; has pictures of these as now rash has resolved  Medications tried: none  New medications or antibiotics: no Tick, Insect or new pet exposure: no Recent travel: no New detergent or soap: no, but just moved into her new apartment and used a laundry machine that was left from previous renters; she washed clothes and sheets last night and woke up with the rash this morning  Immunocompromised: no  Symptoms Itching: yes Pain over rash: no Feeling ill all over: no Fever: no Mouth sores: no Face or tongue swelling: lip edema that has resolved, no tongue swelling  Trouble breathing: no Joint swelling or pain: no     -  reports that she has quit smoking. She has quit using smokeless tobacco. - Review of Systems: Per HPI. - Past Medical History: Patient Active Problem List   Diagnosis Date Noted  . Foreign body sensation in throat 12/15/2017  . Seasonal allergies 11/19/2017  . Nausea without vomiting 11/19/2017  . Acute bilateral low back pain without sciatica 08/24/2017  . Migraine without status migrainosus, not intractable 02/17/2017  . Screening for cervical cancer 06/16/2016  . Amenorrhea 06/16/2016  . Generalized anxiety disorder 01/23/2016   - Medications: reviewed and updated   Objective:   Physical Exam BP 112/80   Pulse 78   Temp 98.2 F (36.8 C)   SpO2 100%  Gen: NAD, alert, cooperative with exam, well-appearing HEENT: No lip or tongue edema. No oral lesions. Oropharynx clear.  Skin: excoriations present over the back of thighs, faded welt present on right wrist, otherwise skin is without rash or erythema         Assessment & Plan:   1. Rash and nonspecific skin  eruption Suspect contact dermatitis from residue from washing machine use given diffuse presentation and correlation to washing clothes/sheets. Rash now essentially resolved. Have recommended Benadryl prn itching and Hydrocortisone if has any new skin lesions. Discussed cleaning washing machine and re-washing with her typical detergent. She reports having very sensitive skin making this more likely as etiology. Discussed strict precautions that would warrant ED evaluation including tongue edema, throat itching, sensation of throat closing, trouble swallowing, or trouble breathing.   Marcy Sirenatherine Desirre Eickhoff, D.O. 01/03/2018, 4:46 PM PGY-3, [facility]

## 2018-01-04 ENCOUNTER — Encounter (HOSPITAL_COMMUNITY): Payer: Self-pay

## 2018-01-04 ENCOUNTER — Emergency Department (HOSPITAL_COMMUNITY)
Admission: EM | Admit: 2018-01-04 | Discharge: 2018-01-04 | Disposition: A | Discharge status: Home / Self Care | Payer: Medicaid Other | Attending: Emergency Medicine | Admitting: Emergency Medicine

## 2018-01-04 ENCOUNTER — Other Ambulatory Visit: Payer: Self-pay

## 2018-01-04 DIAGNOSIS — Z87891 Personal history of nicotine dependence: Secondary | ICD-10-CM | POA: Insufficient documentation

## 2018-01-04 DIAGNOSIS — L509 Urticaria, unspecified: Secondary | ICD-10-CM | POA: Insufficient documentation

## 2018-01-04 DIAGNOSIS — Z79899 Other long term (current) drug therapy: Secondary | ICD-10-CM | POA: Insufficient documentation

## 2018-01-04 MED ORDER — PREDNISONE 10 MG (21) PO TBPK: ORAL_TABLET | Freq: Every day | ORAL | 0 refills | Status: DC

## 2018-01-04 MED ORDER — FAMOTIDINE 40 MG PO TABS: 40.0000 mg | ORAL_TABLET | Freq: Every day | ORAL | 0 refills | Status: DC

## 2018-01-04 MED ORDER — LORATADINE 10 MG PO TABS: 10.0000 mg | ORAL_TABLET | Freq: Every day | ORAL | 0 refills | Status: DC

## 2018-01-04 NOTE — ED Provider Notes (Signed)
Deborah Mahoney   CSN: [id number] Arrival date & time: 01/04/18  1107     History   Chief Complaint Chief Complaint  Patient presents with  . Rash    HPI Deborah Mahoney is a 27 y.o. female.  HPI   Patient is a 27 year old female history of anemia presents the emergency department to be evaluated for hive-like rash to her bilateral arms, bilateral lower extremities began yesterday.  She states that initially yesterday she had some upper lip swelling.  She then developed a rash to her wrist which then spread to her arms and legs.  The rash was itchy and erythematous and raised.  States that her upper lip swelling has completely resolved and the rash improved after she took Benadryl last night, however after returning to work today she developed the same rash again.  She denies any lip swelling, tongue swelling, throat swelling, difficulty swallowing, difficulty breathing, wheezing or any other symptoms.  She denies any new soaps, detergents, medications.  Denies any new jobs or pets.  She did move into a new apartment about 1 month ago.  She has not tried any medications today.  Past Medical History:  Diagnosis Date  . Anemia   . Infection    UTI  . Seasonal allergies     Patient Active Problem List   Diagnosis Date Noted  . Foreign body sensation in throat 12/15/2017  . Seasonal allergies 11/19/2017  . Nausea without vomiting 11/19/2017  . Acute bilateral low back pain without sciatica 08/24/2017  . Migraine without status migrainosus, not intractable 02/17/2017  . Screening for cervical cancer 06/16/2016  . Amenorrhea 06/16/2016  . Generalized anxiety disorder 01/23/2016    Past Surgical History:  Procedure Laterality Date  . NO PAST SURGERIES       OB History    Gravida  1   Para  1   Term  1   Preterm      AB      Living  1     SAB      TAB      Ectopic      Multiple      Live Births  1              Home Medications    Prior to Admission medications   Medication Sig Start Date End Date Taking? Authorizing Provider  acetaminophen (TYLENOL) 325 MG tablet Take 650 mg by mouth every 6 (six) hours as needed for mild pain.    [provider]  citalopram (CELEXA) 20 MG tablet Take 1 tablet (20 mg total) by mouth daily. 04/16/17   Mikell, Antionette Poles, MD  famotidine (PEPCID) 40 MG tablet Take 1 tablet (40 mg total) by mouth daily for 7 days. 01/04/18 01/11/18  Cristella Stiver S, PA-C  hydrOXYzine (ATARAX/VISTARIL) 25 MG tablet Take 0.5-1 tablets (12.5-25 mg total) by mouth every 6 (six) hours. 04/14/17   Little, Ambrose Finland, MD  ibuprofen (ADVIL,MOTRIN) 200 MG tablet Take 600 mg by mouth every 6 (six) hours as needed for mild pain.    [provider]  loratadine (CLARITIN) 10 MG tablet Take 1 tablet (10 mg total) by mouth daily. 01/04/18   Duchess Armendarez S, PA-C  loratadine-pseudoephedrine (CLARITIN-D 24 HOUR) 10-240 MG 24 hr tablet Take 1 tablet by mouth daily. 11/19/17   Lockamy, Timothy, DO  ondansetron (ZOFRAN ODT) 4 MG disintegrating tablet Take 1 tablet (4 mg total) by mouth every  8 (eight) hours as needed for nausea or vomiting. Patient not taking: Reported on 04/14/2017 02/15/17   Mayo, Allyn Kenner, MD  predniSONE (STERAPRED UNI-PAK 21 TAB) 10 MG (21) TBPK tablet Take by mouth daily. Take 6 tabs by mouth daily  for 2 days, then 5 tabs for 2 days, then 4 tabs for 2 days, then 3 tabs for 2 days, 2 tabs for 2 days, then 1 tab by mouth daily for 2 days 01/04/18   Breck Hollinger S, PA-C    Family History Family History  Problem Relation Age of Onset  . Hypertension Mother   . Hyperlipidemia Father   . Hypertension Father   . Hearing loss Neg Hx     Social History Social History   Tobacco Use  . Smoking status: Former Games developer  . Smokeless tobacco: Former Engineer, water Use Topics  . Alcohol use: Yes    Comment: occ stoppped with pregnancy  . Drug use: Yes     Types: Marijuana    Comment: stopped when found out pregnant     Allergies   Patient has no known allergies.   Review of Systems Review of Systems  Constitutional: Negative for chills and fever.  HENT: Negative for sore throat, trouble swallowing and voice change.        No throat swelling, no lip or tongue swelling  Eyes: Negative for pain and visual disturbance.  Respiratory: Negative for cough and shortness of breath.   Cardiovascular: Negative for chest pain and palpitations.  Gastrointestinal: Negative for abdominal pain, diarrhea, nausea and vomiting.  Genitourinary: Negative for dysuria and hematuria.  Musculoskeletal: Negative for back pain.  Skin: Positive for rash.  Neurological: Negative for headaches.  All other systems reviewed and are negative.  Physical Exam Updated Vital Signs BP 120/81 (BP Location: Right Arm)   Pulse 65   Temp 98.7 F (37.1 C) (Oral)   Resp 16   Ht 5\' 5"  (1.651 m)   Wt 96.6 kg (213 lb)   SpO2 99%   BMI 35.45 kg/m   Physical Exam  Constitutional: She appears well-developed and well-nourished. No distress.  HENT:  Head: Normocephalic and atraumatic.  No pharyngeal erythema.  No tonsillar swelling or exudate.  No angioedema.  Patent airway.  Normal voice.  No difficulty swallowing.  Eyes: Conjunctivae are normal.  Neck: Neck supple.  Cardiovascular: Normal rate, regular rhythm, normal heart sounds and intact distal pulses.  No murmur heard. Pulmonary/Chest: Effort normal and breath sounds normal. No stridor. No respiratory distress. She has no wheezes.  Abdominal: Soft. Bowel sounds are normal. She exhibits no distension. There is no tenderness.  Musculoskeletal: She exhibits no edema.  Neurological: She is alert.  Skin: Skin is warm and dry.  Erythematous urticarial rash noted to the bilateral lower extremities on the thighs.  Psychiatric: She has a normal mood and affect.  Nursing Mahoney and vitals reviewed.  ED Treatments /  Results  Labs (all labs ordered are listed, but only abnormal results are displayed) Labs Reviewed - No data to display  EKG None  Radiology No results found.  Procedures Procedures (including critical care time)  Medications Ordered in ED Medications - No data to display   Initial Impression / Assessment and Plan / ED Course  I have reviewed the triage vital signs and the nursing notes.  Pertinent labs & imaging results that were available during my care of the patient were reviewed by me and considered in my medical decision making (see  chart for details).   Final Clinical Impressions(s) / ED Diagnoses   Final diagnoses:  Hives   Rash consistent with urticaria likely from allergic reaction. Patient denies any difficulty breathing or swallowing.  Pt has a patent airway without stridor and is handling secretions without difficulty; no angioedema. No blisters, no pustules, no warmth, no draining sinus tracts, no superficial abscesses, no bullous impetigo, no vesicles, no desquamation, no target lesions with dusky purpura or a central bulla. Not tender to touch. No concern for superimposed infection. No concern for SJS, TEN, TSS, tick borne illness, syphilis or other life-threatening condition. Patient re-evaluated prior to dc, is hemodynamically stable, in no respiratory distress, and denies the feeling of throat closing. Pt has been advised to take OTC benadryl & return to the ED if they have a mod-severe allergic rxn (s/s including throat closing, difficulty breathing, swelling of lips face or tongue). Will give referral to allergy specialist for skin testing. Will discharge home with short course of steroids, pepcid and recommend Benadryl as needed for pruritis. Pt is to follow up with their PCP and return for any new or worsening sxs. Pt is agreeable with plan & verbalizes understanding.  ED Discharge Orders        Ordered    famotidine (PEPCID) 40 MG tablet  Daily     01/04/18  1154    loratadine (CLARITIN) 10 MG tablet  Daily     01/04/18 1154    predniSONE (STERAPRED UNI-PAK 21 TAB) 10 MG (21) TBPK tablet  Daily     01/04/18 1154       Isabellamarie Randa S, PA-C 01/04/18 1254    Loren RacerYelverton, David, MD 01/07/18 1909

## 2018-01-04 NOTE — ED Notes (Signed)
ED Provider at bedside. 

## 2018-01-04 NOTE — ED Notes (Signed)
Pt verbalized understanding of discharge instructions and denies any further questions at this time.   

## 2018-01-04 NOTE — Discharge Instructions (Addendum)
Please take the pepcid, claritin, and steroids as directed. Please follow up with your primary doctor in 1 week for reevaluation.  You were given a referral to an allergy center which you may need to go to to get skin testing.  Please follow-up in the emergency department if you have any lip swelling tongue swelling, throat swelling, difficulty swallowing, wheezing or any new or worsening symptoms.

## 2018-01-04 NOTE — ED Triage Notes (Signed)
Pt reports that she started having hives yesterday reports that her top lip was swollen yesterday and moved to her wrist, then thighs, then stomach. Pt states she took benadryl this morning and started breaking out all over her body at work. Pt has not taken any benadryl today, no changes in food, detergents, or soaps.

## 2018-07-21 ENCOUNTER — Ambulatory Visit: Payer: Medicaid Other

## 2018-07-29 ENCOUNTER — Ambulatory Visit (INDEPENDENT_AMBULATORY_CARE_PROVIDER_SITE_OTHER): Payer: Self-pay | Admitting: Family Medicine

## 2018-07-29 ENCOUNTER — Other Ambulatory Visit: Payer: Self-pay

## 2018-07-29 VITALS — BP 98/60 | HR 57 | Temp 98.2°F | Ht 65.0 in | Wt 207.6 lb

## 2018-07-29 DIAGNOSIS — Z3202 Encounter for pregnancy test, result negative: Secondary | ICD-10-CM

## 2018-07-29 DIAGNOSIS — R109 Unspecified abdominal pain: Secondary | ICD-10-CM

## 2018-07-29 DIAGNOSIS — R300 Dysuria: Secondary | ICD-10-CM

## 2018-07-29 DIAGNOSIS — R42 Dizziness and giddiness: Secondary | ICD-10-CM

## 2018-07-29 LAB — POCT URINALYSIS DIP (MANUAL ENTRY)
BILIRUBIN UA: NEGATIVE
BILIRUBIN UA: NEGATIVE mg/dL
Glucose, UA: NEGATIVE mg/dL
Nitrite, UA: NEGATIVE
Protein Ur, POC: NEGATIVE mg/dL
SPEC GRAV UA: 1.025 (ref 1.010–1.025)
Urobilinogen, UA: 0.2 E.U./dL
pH, UA: 6 (ref 5.0–8.0)

## 2018-07-29 LAB — POCT URINE PREGNANCY: Preg Test, Ur: NEGATIVE

## 2018-07-29 MED ORDER — CEPHALEXIN 500 MG PO CAPS: 500.0000 mg | ORAL_CAPSULE | Freq: Four times a day (QID) | ORAL | 0 refills | Status: AC

## 2018-07-29 NOTE — Progress Notes (Signed)
   CC: ?UTI  HPI  [place] were in town 1 week ago with what seemed like a viral illness. Included 24-48 hrs of V/D. Patient had some diarrhea Tuesday into thursday. Thinks she has a UTI, dizzy, fatigue, nauseated. Does get frequent UTIs. Feels she doesn't drink enough water. Last UTI was over a year ago. Never hospitalized for UTI. Thinks its a UTI because of the nausea, but doesn't have urgency that she had before, some irritation around the urethra.   Blood in urine - periods are irregular, having spotting today.  She is actually trying to get pregnant.  ROS: Denies CP, SOB, abdominal pain, dysuria, changes in BMs.   CC, SH/smoking status, and VS noted  Objective: BP 98/60   Pulse (!) 57   Temp 98.2 F (36.8 C) (Oral)   Ht 5\' 5"  (1.651 m)   Wt 207 lb 9.6 oz (94.2 kg)   SpO2 98%   BMI 34.55 kg/m  Gen: NAD, alert, cooperative, and pleasant. HEENT: NCAT, EOMI, PERRL CV: RRR, no murmur Resp: CTAB, no wheezes, non-labored Abd: SNTND, BS present, no guarding or organomegaly Ext: No edema, warm Neuro: Alert and oriented, Speech clear, No gross deficits  Assessment and plan:  Dysuria: UA without convincing signs of infection, but given symptomatology will treat and culture.  We will call if culture suggests antibiotics are inappropriate.  Suspect that some of this nausea may actually be post viral or viral related.  Expect that to continue to improve.  If UA is negative, and these symptoms continue to occur at some point may need to consider interstitial cystitis.  Irregular menses: Patient states she has always had irregular periods, and additionally is trying to get pregnant.  She is concerned about both of these.  Recommended that she schedule an appointment with our GYN clinic to discuss further work-up for this.  Orders Placed This Encounter  Procedures  . Urine Culture  . CBC  . Basic metabolic panel  . POCT urinalysis dipstick  . POCT urine pregnancy    Meds ordered this  encounter  Medications  . cephALEXin (KEFLEX) 500 MG capsule    Sig: Take 1 capsule (500 mg total) by mouth 4 (four) times daily for 5 days. Take for 7 days    Dispense:  20 capsule    Refill:  0    Loni Muse, MD, PGY3 08/01/2018 10:59 AM

## 2018-07-29 NOTE — Patient Instructions (Signed)
It was a pleasure to see you today! Thank you for choosing [facility] for your primary care. Deborah Mahoney was seen for possible UTI, dizziness.   Our plans for today were:  We will try an antibiotic for your irritation when peeing. I will call you with the results. Call us or go to the ED if you feel worse.   We will send blood labs to check your electrolytes and blood count.   Please make the gyn clinic appt that we discussed.    Best,  Dr. Chanetta Marshall

## 2018-07-30 LAB — BASIC METABOLIC PANEL
BUN / CREAT RATIO: 17 (ref 9–23)
BUN: 13 mg/dL (ref 6–20)
CO2: 26 mmol/L (ref 20–29)
CREATININE: 0.75 mg/dL (ref 0.57–1.00)
Calcium: 9.1 mg/dL (ref 8.7–10.2)
Chloride: 104 mmol/L (ref 96–106)
GFR calc Af Amer: 125 mL/min/{1.73_m2} (ref >59)
GFR calc non Af Amer: 108 mL/min/{1.73_m2} (ref >59)
GLUCOSE: 74 mg/dL (ref 65–99)
Potassium: 3.7 mmol/L (ref 3.5–5.2)
SODIUM: 144 mmol/L (ref 134–144)

## 2018-07-30 LAB — CBC
HEMOGLOBIN: 12.6 g/dL (ref 11.1–15.9)
Hematocrit: 36.4 % (ref 34.0–46.6)
MCH: 30.6 pg (ref 26.6–33.0)
MCHC: 34.6 g/dL (ref 31.5–35.7)
MCV: 88 fL (ref 79–97)
Platelets: 230 10*3/uL (ref 150–450)
RBC: 4.12 x10E6/uL (ref 3.77–5.28)
RDW: 11.9 % (ref 11.7–15.4)
WBC: 4.1 10*3/uL (ref 3.4–10.8)

## 2018-08-01 LAB — URINE CULTURE

## 2018-08-18 ENCOUNTER — Ambulatory Visit: Payer: Medicaid Other

## 2018-09-26 ENCOUNTER — Other Ambulatory Visit: Payer: Self-pay

## 2018-09-26 ENCOUNTER — Ambulatory Visit (INDEPENDENT_AMBULATORY_CARE_PROVIDER_SITE_OTHER): Payer: Self-pay | Admitting: Family Medicine

## 2018-09-26 ENCOUNTER — Encounter: Payer: Self-pay | Admitting: Family Medicine

## 2018-09-26 DIAGNOSIS — L03011 Cellulitis of right finger: Secondary | ICD-10-CM | POA: Insufficient documentation

## 2018-09-26 MED ORDER — DOXYCYCLINE HYCLATE 100 MG PO TABS: 100.0000 mg | ORAL_TABLET | Freq: Two times a day (BID) | ORAL | 0 refills | Status: DC

## 2018-09-26 MED ORDER — MUPIROCIN 2 % EX OINT: 1.0000 "application " | TOPICAL_OINTMENT | Freq: Two times a day (BID) | CUTANEOUS | 0 refills | Status: DC

## 2018-09-26 NOTE — Patient Instructions (Signed)
If you want to google, the term is paronychia I sent in an antibiotic pill and ointment.   This should heal up without problem.

## 2018-09-27 ENCOUNTER — Encounter: Payer: Self-pay | Admitting: Family Medicine

## 2018-09-27 NOTE — Progress Notes (Signed)
Established Patient Office Visit  Subjective:  Patient ID: Deborah Mahoney, female    DOB: [birth year]  Age: 28 y.o. MRN: [id number]  CC:  Chief Complaint  Patient presents with  . Swollen finger    HPI Deborah Mahoney presents for swollen middle finger right hand.  Pain and redness around nail.  Pain is all distal.  Nothing in proximal finger or hand.  No fever or systemic signs.  Not immunocompromised.  Present x 2 days and worsening.  Past Medical History:  Diagnosis Date  . Anemia   . Infection    UTI  . Seasonal allergies     Past Surgical History:  Procedure Laterality Date  . NO PAST SURGERIES      Family History  Problem Relation Age of Onset  . Hypertension Mother   . Hyperlipidemia Father   . Hypertension Father   . Hearing loss Neg Hx     Social History   Socioeconomic History  . Marital status: Single    Spouse name: Not on file  . Number of children: Not on file  . Years of education: Not on file  . Highest education level: Not on file  Occupational History  . Not on file  Social Needs  . Financial resource strain: Not on file  . Food insecurity:    Worry: Not on file    Inability: Not on file  . Transportation needs:    Medical: Not on file    Non-medical: Not on file  Tobacco Use  . Smoking status: Former Games developer  . Smokeless tobacco: Former Engineer, water and Sexual Activity  . Alcohol use: Yes    Comment: occ stoppped with pregnancy  . Drug use: Yes    Types: Marijuana    Comment: stopped when found out pregnant  . Sexual activity: Yes    Birth control/protection: None  Lifestyle  . Physical activity:    Days per week: Not on file    Minutes per session: Not on file  . Stress: Not on file  Relationships  . Social connections:    Talks on phone: Not on file    Gets together: Not on file    Attends religious service: Not on file    Active member of club or organization: Not on file    Attends meetings of clubs or  organizations: Not on file    Relationship status: Not on file  . Intimate partner violence:    Fear of current or ex partner: Not on file    Emotionally abused: Not on file    Physically abused: Not on file    Forced sexual activity: Not on file  Other Topics Concern  . Not on file  Social History Narrative  . Not on file    Outpatient Medications Prior to Visit  Medication Sig Dispense Refill  . acetaminophen (TYLENOL) 325 MG tablet Take 650 mg by mouth every 6 (six) hours as needed for mild pain.    . citalopram (CELEXA) 20 MG tablet Take 1 tablet (20 mg total) by mouth daily. 30 tablet 0  . famotidine (PEPCID) 40 MG tablet Take 1 tablet (40 mg total) by mouth daily for 7 days. 7 tablet 0  . hydrOXYzine (ATARAX/VISTARIL) 25 MG tablet Take 0.5-1 tablets (12.5-25 mg total) by mouth every 6 (six) hours. 30 tablet 0  . ibuprofen (ADVIL,MOTRIN) 200 MG tablet Take 600 mg by mouth every 6 (six) hours as needed for mild pain.    Marland Kitchen  loratadine (CLARITIN) 10 MG tablet Take 1 tablet (10 mg total) by mouth daily. 30 tablet 0  . loratadine-pseudoephedrine (CLARITIN-D 24 HOUR) 10-240 MG 24 hr tablet Take 1 tablet by mouth daily. 30 tablet 1  . ondansetron (ZOFRAN ODT) 4 MG disintegrating tablet Take 1 tablet (4 mg total) by mouth every 8 (eight) hours as needed for nausea or vomiting. (Patient not taking: Reported on 04/14/2017) 8 tablet 0  . predniSONE (STERAPRED UNI-PAK 21 TAB) 10 MG (21) TBPK tablet Take by mouth daily. Take 6 tabs by mouth daily  for 2 days, then 5 tabs for 2 days, then 4 tabs for 2 days, then 3 tabs for 2 days, 2 tabs for 2 days, then 1 tab by mouth daily for 2 days 42 tablet 0   No facility-administered medications prior to visit.     No Known Allergies  ROS Review of Systems    Objective:    Physical Exam  BP 102/70   Pulse 61   Temp 98.3 F (36.8 C) (Oral)   Ht 5\' 5"  (1.651 m)   Wt 206 lb (93.4 kg)   SpO2 97%   BMI 34.28 kg/m  Wt Readings from Last 3  Encounters:  09/26/18 206 lb (93.4 kg)  07/29/18 207 lb 9.6 oz (94.2 kg)  01/04/18 213 lb (96.6 kg)   Paronychia middle finger right hand.  Informed consent obtained.  I&Ded without anesthesia.  1cc of frank pus expressed.  Good immediate relief.  Dressed with a bandaid and bactroban.  Health Maintenance Due  Topic Date Due  . INFLUENZA VACCINE  02/10/2018    There are no preventive care reminders to display for this patient.  Lab Results  Component Value Date   TSH 1.100 11/30/2016   Lab Results  Component Value Date   WBC 4.1 07/29/2018   HGB 12.6 07/29/2018   HCT 36.4 07/29/2018   MCV 88 07/29/2018   PLT 230 07/29/2018   Lab Results  Component Value Date   NA 144 07/29/2018   K 3.7 07/29/2018   CO2 26 07/29/2018   GLUCOSE 74 07/29/2018   BUN 13 07/29/2018   CREATININE 0.75 07/29/2018   BILITOT 0.7 04/22/2016   ALKPHOS 45 04/22/2016   AST 18 04/22/2016   ALT 17 04/22/2016   PROT 7.2 04/22/2016   ALBUMIN 3.9 04/22/2016   CALCIUM 9.1 07/29/2018   ANIONGAP 9 04/14/2017   No results found for: CHOL No results found for: HDL No results found for: LDLCALC No results found for: TRIG No results found for: CHOLHDL No results found for: DSKA7G    Assessment & Plan:   Problem List Items Addressed This Visit    Paronychia of finger of right hand   Relevant Medications   mupirocin ointment (BACTROBAN) 2 %   doxycycline (VIBRA-TABS) 100 MG tablet      Meds ordered this encounter  Medications  . mupirocin ointment (BACTROBAN) 2 %    Sig: Apply 1 application topically 2 (two) times daily.    Dispense:  22 g    Refill:  0  . doxycycline (VIBRA-TABS) 100 MG tablet    Sig: Take 1 tablet (100 mg total) by mouth 2 (two) times daily.    Dispense:  14 tablet    Refill:  0    Follow-up: No follow-ups on file.    Moses Manners, MD

## 2018-09-27 NOTE — Assessment & Plan Note (Signed)
Adaquately drained.  Oral and topical antibiotics.

## 2018-10-04 ENCOUNTER — Other Ambulatory Visit: Payer: Self-pay

## 2018-10-04 ENCOUNTER — Telehealth (INDEPENDENT_AMBULATORY_CARE_PROVIDER_SITE_OTHER): Payer: Self-pay | Admitting: Family Medicine

## 2018-10-04 ENCOUNTER — Telehealth: Payer: Self-pay | Admitting: Family Medicine

## 2018-10-04 DIAGNOSIS — F411 Generalized anxiety disorder: Secondary | ICD-10-CM

## 2018-10-04 NOTE — Telephone Encounter (Signed)
Began telephonic visit, patient said she was at work and would call back on more private setting/line.  She has not yet called back. I attempted to call the patient back, but the mobile phone number was her mother's number.  Her mother gave me the patient's Cell phone number [phone]. This phone number is listed as the home number for the patient.   I attempted a call to this new phone number.  There was no answer and no VM option.   Initial history gathered is that CC: chest heaviness "It is probably anxiety: Onset about 0ne week ago  Worse with sleeping a certain way - has sense of chest fluttering She will cough to relieve the chest sensations.   No fever/chills/sweats No SHOB No fatigue.  NO GI symptoms.   Patient stopped a prescribed medication for anxiety bc she did not like the way it made her feel. Psychotropic medications in patient's past med list are citalopram, Atarax, and Rx of sertraline in 2017.    Review of chart show PMH: GAD and Panic Attacks around 2017-18 [facility].  A/ CC: Chest pain and palpitations P/ Interview prematurely terminated with patient's plan to recontact [facility].

## 2018-10-04 NOTE — Assessment & Plan Note (Signed)
Patient reports chest heaviness and palpitations for the past few days.  Patient has a history of general anxiety and was previously on Celexa but has not been on any medication in over a year due to poor tolerance by patient.  Symptoms described are more concerning for anxiety than actual cardiac process.  Patient did not have any red flags such as shortness of breath, dizziness, radiation to arm/jaw, diaphoresis.  Symptoms occurrence correlate with COVID outbreak and patient patient has acknowledge so.  Discussed need for clinic visit versus symptoms management at home.  Patient did not feel like she needed to be seen in clinic urgently.  Recommended deep breathing and relaxation exercises to help cope with her anxiety symptoms.  If patient continue to experience chest heaviness and palpitations, she will contact the office to discuss possible visit with behavioral clinic vs PCP appointment/ATC. Patient verbalized understanding and was in agreement with plan.

## 2018-10-04 NOTE — Progress Notes (Addendum)
[facility] Telemedicine Visit  Patient consented to have visit conducted via telephone.  Encounter participants: Patient: Deborah Mahoney  Provider: Lovena Neighbours  Others (if applicable):  Chief Complaint: Chest heaviness  HPI: Patient is a 28 year old female who past medical history significant for anxiety called today to discuss recent episode of chest heaviness and heart flutter.  Patient reports that she has been in experiencing more episodes since the beginning of the current outbreak.  Patient was previously on anxiety medication but has not taken them in a long time because of the way they made her feel.  Patient denies any chest pain, shortness of breath, dizziness, diaphoresis, radiation to arm/jaw associated with her symptoms.  Patient has not seen a therapist for anxiety in the past.  Patient reports that coughing times helps symptoms of chest heaviness. Patient initially thought her symptoms were associated with her anxiety but given how long it has been going on she was concern that it could be more serious.   ROS:  Denies any chest pain, shortness of breath, headaches, nausea, vomiting, abdominal pain, dizziness.  Pertinent PMHx: Anxiety  Assessment/Plan:  Generalized anxiety disorder Patient reports chest heaviness and palpitations for the past few days.  Patient has a history of general anxiety and was previously on Celexa but has not been on any medication in over a year due to poor tolerance by patient.  Symptoms described are more concerning for anxiety than actual cardiac process.  Patient did not have any red flags such as shortness of breath, dizziness, radiation to arm/jaw, diaphoresis.  Symptoms occurrence correlate with COVID outbreak and patient patient has acknowledge so.  Discussed need for clinic visit versus symptoms management at home.  Patient did not feel like she needed to be seen in clinic urgently.  Recommended deep breathing and  relaxation exercises to help cope with her anxiety symptoms.  If patient continue to experience chest heaviness and palpitations, she will contact the office to discuss possible visit with behavioral clinic vs PCP appointment/ATC. Patient verbalized understanding and was in agreement with plan.    Time spent on phone with patient: 7 minutes;

## 2018-10-06 NOTE — Progress Notes (Signed)
error 

## 2019-01-23 ENCOUNTER — Telehealth (INDEPENDENT_AMBULATORY_CARE_PROVIDER_SITE_OTHER): Payer: Medicaid Other | Admitting: Family Medicine

## 2019-01-23 DIAGNOSIS — R0789 Other chest pain: Secondary | ICD-10-CM

## 2019-01-23 NOTE — Progress Notes (Signed)
[place] Telemedicine Visit  Patient consented to have virtual visit. Method of visit: Video  Encounter participants: Patient: Deborah Mahoney - located at home Provider: Bonnita Hollow - located at office Others (if applicable): none  Chief Complaint: chest tightness  HPI: Deborah Mahoney is a 28 y.o. female who presents with 1 week of chest tightness.  Patient says she has a history of anxiety and this feels somewhat like an anxiety attack.  Chest pain is not always on, it can be intermittent.  Patient is not experiencing chest pain right now.  Not associated with exertion.  Patient endorses that it was worse this past Saturday where she had an episode of vomiting after drinking too much alcohol.  Patient says that she now just feels fatigued and dehydrated.  Patient says that she has been making herself cough to help relieve the chest pain but does not endorse any ongoing cough as a symptom.  Patient is also asking referral to psychology/behavioral health.  ROS: per HPI  Pertinent PMHx: Anxiety  Exam:  General: No acute distress Respiratory: Able to speak in full sentences without difficulty, normal work of breathing Skin: Dry and no obvious bruises  Assessment/Plan: Spoke with patient regarding her chest pain.  Whenever we have a diagnosis of chest pain, we will take this seriously so I recommend the patient be seen in the inpatient setting.  Given patient's history of anxiety and well appearance on video, low suspicion this is cardiac or pulmonary related however.  Patient wishes to be seen this Friday where she has her daughter's appointment as well and does not want to miss too much work.  Strict return precautions given including worsening chest pain, worsening shortness of breath, increased frequency, fevers, chills, cough.  Placed referral as requested for behavioral health.  Time spent during visit with patient: 8 minutes

## 2019-01-25 ENCOUNTER — Other Ambulatory Visit: Payer: Self-pay

## 2019-01-25 ENCOUNTER — Ambulatory Visit (INDEPENDENT_AMBULATORY_CARE_PROVIDER_SITE_OTHER): Payer: Self-pay | Admitting: Family Medicine

## 2019-01-25 ENCOUNTER — Encounter: Payer: Self-pay | Admitting: Family Medicine

## 2019-01-25 VITALS — BP 114/70

## 2019-01-25 DIAGNOSIS — F411 Generalized anxiety disorder: Secondary | ICD-10-CM

## 2019-01-25 MED ORDER — BUSPIRONE HCL 5 MG PO TABS: 7.5000 mg | ORAL_TABLET | Freq: Two times a day (BID) | ORAL | Status: DC

## 2019-01-25 NOTE — Patient Instructions (Addendum)
It was a pleasure to meet you today. I want you to work on the thing we discussed regarding stress relief and self care ( exercise, time for you). I am also going to prescribe a new medication called Buspirone. I want you to take it for two weeks and then come back for a follow up. I am starting you out at the lowest dose but if you feel like you are getting some benefit but would like to increase the dose please call and we can call you a new prescription in.   I am also going to send a note to Casimer Lanius and she will contact you about your therapy options.   Have a great afternoon!

## 2019-01-25 NOTE — Progress Notes (Signed)
    Subjective:  Deborah Mahoney is a 28 y.o. female who presents to the [facility] today with a chief complaint of increased anxiety and chest tightness.   HPI: Patient reports that she has a history of anxiety issues with her last panic attack being more than 3 months ago and that over the last few days she has noticed an increase in her anxiety levels and she is concerned that she is going to have a panic attack. She reports with this comes chest tightness and the feeling that she has to catch her breath. She says she is overwhelmed by everything she has to do. Stressors include COVID, work, her 20 year old daughter, and planning her wedding. She was prescribed celexa in the past for which she took one dose because she said it made her "feel like a zombie". Another way that she has managed her stress in the past is exercise but she has been unable to do that recently.   Denies headaches, chest pain, nausea, vomiting, diarrhea. Reports chest tightness.   Objective:  Physical Exam: BP 114/70   Gen: Anxious when I enter the room and begins crying. Collects herself over a minute or so CV: RRR with no murmurs appreciated Pulm: NWOB, CTAB with no crackles, wheezes, or rhonchi GI: Normal bowel sounds present. Soft, Nontender, Nondistended. MSK: no edema, cyanosis, or clubbing noted Skin: warm, dry Neuro: grossly normal, moves all extremities Psych: Tearful but consolable   No results found for this or any previous visit (from the past 72 hour(s)).   Assessment/Plan:  Generalized anxiety disorder GAD-19  - Patient reports she took one dose of Celexa and stopped because it made her feel like a Zombie. - Discussed stress relief techniques such as exercise and time for her self - Prescribed 7.5 Buspar BID  - Follow up visit in 2 weeks    Lab Orders  No laboratory test(s) ordered today    Meds ordered this encounter  Medications  . busPIRone (BUSPAR) tablet 7.5 mg     Gifford Shave, MD   PGY-1, [facility]  01/25/19 4:52 PM

## 2019-01-25 NOTE — Assessment & Plan Note (Signed)
GAD-19  - Patient reports she took one dose of Celexa and stopped because it made her feel like a Zombie. - Discussed stress relief techniques such as exercise and time for her self - Prescribed 7.5 Buspar BID  - Follow up visit in 2 weeks

## 2019-01-26 ENCOUNTER — Telehealth: Payer: Self-pay | Admitting: Licensed Clinical Social Worker

## 2019-01-26 NOTE — Telephone Encounter (Signed)
   Unsuccessful Phone Outreach Note  01/26/2019 Name: Deborah Mahoney MRN: [id number] DOB: 10/07/1990  Referred by: Dr. Richmond Campbell Reason for referral : Care Coordination (for therapy options ) ;.  Deborah Mahoney is a 28 y.o. year old female who sees Daisy Floro, DO for primary care.   two unsuccessful telephone outreach to patient was attempted today reference the above referral.  Unable to leave voice message. Record stated unable to complete call at this time check the number and call again.  Follow Up Plan: LCSW will call again in 3 to 5 days.  Casimer Lanius, LCSW [facility]   [phone] 1:45 PM

## 2019-01-27 ENCOUNTER — Telehealth: Payer: Self-pay | Admitting: *Deleted

## 2019-01-27 ENCOUNTER — Ambulatory Visit: Payer: Medicaid Other | Admitting: Family Medicine

## 2019-01-27 MED ORDER — BUSPIRONE HCL 7.5 MG PO TABS: 7.5000 mg | ORAL_TABLET | Freq: Two times a day (BID) | ORAL | 0 refills | Status: DC

## 2019-01-27 NOTE — Telephone Encounter (Signed)
Pt called because pharmacy still has not received medication.  Upon review of chart I noticed that medication was entered as clinic administered med.   Verbal given form Dr. Ardelia Mems to resend as script.  Pt informed. Christen Bame, CMA

## 2019-02-01 NOTE — Telephone Encounter (Signed)
2nd Unsuccessful Phone Outreach Note  02/01/2019 Name: Deborah Mahoney MRN: [id number] DOB: 1991-02-04  Referred by: Daisy Floro, DO ,  Reason for referral : Care Coordination (for therapy options )   Deborah Mahoney is a 27 y.o. year old female who sees Daisy Floro, DO for primary care. 2nd unsuccessful telephone outreach attempt to [name] today. Left message with patient's mother to call LCSW.  Plan: LCSW will wait for return call, if no return call is received, will discontinue outreach calls but will be available at any time to provide services to [name].   Casimer Lanius, [facility]   [phone] 4:12 PM

## 2019-02-07 NOTE — Telephone Encounter (Signed)
Care Coordination  Telephone Outreach Note  02/07/2019 Name: Deborah Mahoney MRN: [id number] DOB: Jun 22, 1991  Referred by: Daisy Floro, DO Reason for referral : Care Coordination (for therapy options )   Returning phone call to Ms. Deborah Mahoney How reference assessing for the above referral.   Patient is at work.    Plan:  Phone appointment scheduled during her lunch time tomorrow at 12:30.  Daisy Floro, DO has been notified of this outreach and Ms. Deborah Mahoney's plan.   Casimer Lanius, LCSW [facility]   [phone] 2:06 PM

## 2019-02-08 ENCOUNTER — Ambulatory Visit: Payer: Medicaid Other | Admitting: Family Medicine

## 2019-02-08 ENCOUNTER — Other Ambulatory Visit: Payer: Self-pay

## 2019-02-08 ENCOUNTER — Ambulatory Visit: Payer: Self-pay | Admitting: Licensed Clinical Social Worker

## 2019-02-08 DIAGNOSIS — Z7189 Other specified counseling: Secondary | ICD-10-CM

## 2019-02-08 NOTE — BH Specialist Note (Addendum)
   02/08/2019 Name: Deborah Mahoney MRN: [id number] DOB: 1990/10/22  Referred by: Daisy Floro, DO Reason for referral : Care Coordination and Anxiety Total time: 15 minutes  Type of Visit: Telephonic Patient location: work LCSW  location: home persons other than patient participating in visit: none Confirmed patient's name: Yes  Confirmed patient's date of birth: Yes  Confirmed patient's insurance: (not billing for service) Yes  Discussed confidentiality: Yes    Deborah Mahoney is a 28 y.o. year old female who sees Daisy Floro, DO for primary care.  Referred for assistance with exploring therapy options.   LCSW introduced self, and assessed for needs and barriers.No prior therapy treatment, patient doe not like to take medication, is currently taking 705 Buspar.  Reports not taking it the past few days as she did not need it.  ASSESSMENT: Patient is pleasant and engaged in conversation, she currently experiencing symptoms of  anxiety which is making it had to function at work.  Patient works during the day. Patient may benefit from and is in agreement to receive further assessment and ongoing  therapeutic interventions to assist with managing her symptoms.   GOALS: 1. Reduce symptoms of: anxiety 2. Increase ability of: coping skills and self-management skills   INTERVENTION: Client interviewed and appropriate assessments performed. discussed community therapy options that take her insurance ; also utilized engagement as part of my intervention.  Other interventions include:  Solution-Focused Strategies,  Data processing manager and Psychoeducation  No SDOH (Social Determinants of Health) identified.   Follow Up Plan:  1. Patient will check with her employer for EAP 2. Patient will call her insurance provider for a list of in-network providers 3. Patient declined F/U call from LCSW.  She will call LCSW and provide an update  Dr. Ouida Sills and Dr. Georjean Mode have been notified of  this outreach and plan  Mehar Sagen, [place]   [phone] 1:07 PM

## 2019-07-19 ENCOUNTER — Ambulatory Visit: Payer: 59 | Attending: Internal Medicine

## 2019-07-19 DIAGNOSIS — Z20822 Contact with and (suspected) exposure to covid-19: Secondary | ICD-10-CM

## 2019-07-20 LAB — NOVEL CORONAVIRUS, NAA: SARS-CoV-2, NAA: NOT DETECTED

## 2019-09-15 ENCOUNTER — Encounter: Payer: Self-pay | Admitting: Family Medicine

## 2019-09-15 ENCOUNTER — Ambulatory Visit (INDEPENDENT_AMBULATORY_CARE_PROVIDER_SITE_OTHER): Payer: 59 | Admitting: Family Medicine

## 2019-09-15 ENCOUNTER — Telehealth: Payer: Self-pay

## 2019-09-15 ENCOUNTER — Other Ambulatory Visit: Payer: Self-pay

## 2019-09-15 VITALS — BP 108/60 | HR 65 | Ht 65.0 in | Wt 204.2 lb

## 2019-09-15 DIAGNOSIS — N926 Irregular menstruation, unspecified: Secondary | ICD-10-CM | POA: Insufficient documentation

## 2019-09-15 DIAGNOSIS — G43001 Migraine without aura, not intractable, with status migrainosus: Secondary | ICD-10-CM | POA: Insufficient documentation

## 2019-09-15 LAB — POCT URINE PREGNANCY: Preg Test, Ur: NEGATIVE

## 2019-09-15 MED ORDER — SUMATRIPTAN SUCCINATE 50 MG PO TABS: 50.0000 mg | ORAL_TABLET | ORAL | 0 refills | Status: DC | PRN

## 2019-09-15 NOTE — Telephone Encounter (Signed)
Patient calls nurse line regarding results from today's pregnancy test. Informed patient that pregnancy test results were negative.   To PCP  Veronda Prude, RN

## 2019-09-15 NOTE — Assessment & Plan Note (Signed)
Patient is having occasional infrequent migraines without aura, usually respond well to ibuprofen, as well as lying down and resting her eyes.  Patient would like to try something else to help her migraines. -Patient offered propranolol to be taken daily to prevent migraines from occurring as frequently, patient declined -Patient offered sumatriptan 50-100 mg to be taken as needed for migraines when they occur

## 2019-09-15 NOTE — Patient Instructions (Signed)
Thank you for coming in to see [place] today! Please see below to review our plan for today's visit:  1. Take Sumatriptan 50-100mg  at the start of a migraine. If the dose is ineffective you can take another 50-100mg  greater than 2 hours later. You can take these with Tylenol 500mg  every 6 hours as needed.  2. STAY AWAY FROM IBUPROFEN, NAPROXEN, ADVIL, and MIDOL!  3. Get more updated contacts and glasses!   Please call the clinic at [phone] if your symptoms worsen or you have any concerns. It was our pleasure to serve you!   Dr. [phone] [facility]

## 2019-09-15 NOTE — Progress Notes (Signed)
    SUBJECTIVE:   CHIEF COMPLAINT / HPI:   Headaches/Seeing spots: has hx of migraines, not prescribed anything, takes Tylenol and Ibuprofen, could consider Sumatriptan  Migraine: Patient was having migraine yesterday, when she woke up she felt like she got up too fast and every time she feels like that a headache starts. Her headache started in the R frontal area and moved to the middle of her forehead and then to the left side. Her vision felt blurred, she was seeing "black spots popping in and out of her vision", was very sensitive to light.  She took total of Ibuprofen 800mg  and went to sleep around 8am until 9:45am, went back to sleep at 10:05, didn't wake up again until 12:45pm. When she got up at 12:45 the headache had gone away, wasn't seeing any black spots or have a headache. At 7pm she was having headaches, nausea, and seeing spots again. Then she took Advil PM at 7:30pm.  Additionally, patient reports she had eye exam last summer 2021, got contacts for her wedding but did not update her glasses.  She has nearly run out of contacts.  The glasses she's wearing now have not been updated in YEARS, feels this might be contributing to her headaches.   Menstrual irregularity: She is nauseated today, has a little cramping, no headache right now. Her last LMP was sometime in Dec 2020. She spotted in January. Her periods have ALWAYS been this irregular. Last did pregnancy test in early Jan 2021 at home, was negative x2.  She does not want to be on birth control as she is trying to conceive.   PERTINENT  PMH / PSH:  Patient Active Problem List   Diagnosis Date Noted  . Late period 09/15/2019  . Migraine without aura and with status migrainosus, not intractable 09/15/2019  . Paronychia of finger of right hand 09/26/2018  . Foreign body sensation in throat 12/15/2017  . Seasonal allergies 11/19/2017  . Nausea without vomiting 11/19/2017  . Acute bilateral low back pain without sciatica  08/24/2017  . Migraine without status migrainosus, not intractable 02/17/2017  . Screening for cervical cancer 06/16/2016  . Amenorrhea 06/16/2016  . Generalized anxiety disorder 01/23/2016    OBJECTIVE:   BP 108/60   Pulse 65   Ht 5\' 5"  (1.651 m)   Wt 204 lb 4 oz (92.6 kg)   LMP 08/13/2019   SpO2 98%   BMI 33.99 kg/m    Physical exam: General: No apparent distress, nontoxic-appearing, very pleasant patient Respiratory: CTA bilaterally, normal work of breathing Cardiac: RRR, S1-S2 present, no murmurs appreciated Abdomen: Normal bowel sounds appreciated throughout, nondistended, nontender to palpation  ASSESSMENT/PLAN:   Late period Patient has longstanding history of menstrual irregularity, states her last true period was in December 2020, had some spotting in January.  Had some recent nausea with her headaches.  Would like to check a urine pregnancy test. -Urine pregnancy test ordered today is negative  Migraine without aura and with status migrainosus, not intractable Patient is having occasional infrequent migraines without aura, usually respond well to ibuprofen, as well as lying down and resting her eyes.  Patient would like to try something else to help her migraines. -Patient offered propranolol to be taken daily to prevent migraines from occurring as frequently, patient declined -Patient offered sumatriptan 50-100 mg to be taken as needed for migraines when they occur    07-18-2004, DO [facility]

## 2019-09-15 NOTE — Assessment & Plan Note (Signed)
Patient has longstanding history of menstrual irregularity, states her last true period was in December 2020, had some spotting in January.  Had some recent nausea with her headaches.  Would like to check a urine pregnancy test. -Urine pregnancy test ordered today is negative

## 2019-11-01 ENCOUNTER — Ambulatory Visit: Payer: 59 | Attending: Internal Medicine

## 2019-11-01 DIAGNOSIS — Z20822 Contact with and (suspected) exposure to covid-19: Secondary | ICD-10-CM

## 2019-11-02 LAB — SARS-COV-2, NAA 2 DAY TAT

## 2019-11-02 LAB — NOVEL CORONAVIRUS, NAA: SARS-CoV-2, NAA: NOT DETECTED

## 2020-04-30 ENCOUNTER — Other Ambulatory Visit: Payer: Self-pay

## 2020-04-30 DIAGNOSIS — Z20822 Contact with and (suspected) exposure to covid-19: Secondary | ICD-10-CM

## 2020-05-01 LAB — SARS-COV-2, NAA 2 DAY TAT

## 2020-05-01 LAB — NOVEL CORONAVIRUS, NAA: SARS-CoV-2, NAA: NOT DETECTED

## 2020-07-22 ENCOUNTER — Other Ambulatory Visit: Payer: No Typology Code available for payment source

## 2020-07-22 DIAGNOSIS — Z20822 Contact with and (suspected) exposure to covid-19: Secondary | ICD-10-CM

## 2020-07-25 LAB — NOVEL CORONAVIRUS, NAA: SARS-CoV-2, NAA: NOT DETECTED

## 2020-07-26 ENCOUNTER — Ambulatory Visit: Payer: No Typology Code available for payment source | Admitting: Family Medicine

## 2020-08-01 ENCOUNTER — Other Ambulatory Visit: Payer: No Typology Code available for payment source

## 2020-08-01 DIAGNOSIS — Z20822 Contact with and (suspected) exposure to covid-19: Secondary | ICD-10-CM

## 2020-08-02 ENCOUNTER — Ambulatory Visit: Payer: No Typology Code available for payment source | Admitting: Family Medicine

## 2020-08-02 LAB — SPECIMEN STATUS REPORT

## 2020-08-02 LAB — NOVEL CORONAVIRUS, NAA: SARS-CoV-2, NAA: DETECTED — AB

## 2020-08-02 LAB — SARS-COV-2, NAA 2 DAY TAT

## 2020-08-11 NOTE — Progress Notes (Deleted)
   Subjective:   Patient ID: Deborah Mahoney    DOB: 07-27-1990, 30 y.o. female   MRN: [id number]  Deborah Mahoney is a 30 y.o. female with a history of migraine w/out aura, GAD, seasonal allergies, amenorrhea here to discuss COVID vaccine exemption  COVID Counseling:  Review of Systems:  Per HPI.   Objective:   There were no vitals taken for this visit. Vitals and nursing note reviewed.  General: pleasant ***, sitting comfortably in exam chair, well nourished, well developed, in no acute distress with non-toxic appearance HEENT: normocephalic, atraumatic, moist mucous membranes, oropharynx clear without erythema or exudate, TM normal bilaterally  Neck: supple, non-tender without lymphadenopathy CV: regular rate and rhythm without murmurs, rubs, or gallops, no lower extremity edema, 2+ radial and pedal pulses bilaterally Lungs: clear to auscultation bilaterally with normal work of breathing on room air Resp: breathing comfortably on room air, speaking in full sentences Abdomen: soft, non-tender, non-distended, no masses or organomegaly palpable, normoactive bowel sounds Skin: warm, dry, no rashes or lesions Extremities: warm and well perfused, normal tone MSK: ROM grossly intact, strength intact, gait normal Neuro: Alert and oriented, speech normal  Assessment & Plan:   No problem-specific Assessment & Plan notes found for this encounter.  No orders of the defined types were placed in this encounter.  No orders of the defined types were placed in this encounter.   Orpah Cobb, DO PGY-3, [facility] 08/11/2020 8:56 AM

## 2020-08-12 ENCOUNTER — Other Ambulatory Visit: Payer: Self-pay

## 2020-08-12 ENCOUNTER — Ambulatory Visit: Payer: No Typology Code available for payment source | Admitting: Family Medicine

## 2020-08-12 ENCOUNTER — Encounter: Payer: Self-pay | Admitting: Family Medicine

## 2020-08-12 ENCOUNTER — Ambulatory Visit (INDEPENDENT_AMBULATORY_CARE_PROVIDER_SITE_OTHER): Payer: No Typology Code available for payment source | Admitting: Family Medicine

## 2020-08-12 DIAGNOSIS — Z7185 Encounter for immunization safety counseling: Secondary | ICD-10-CM | POA: Diagnosis not present

## 2020-08-12 NOTE — Assessment & Plan Note (Addendum)
-  extensive COVID vaccination counseling provided -patient seems more agreeable to vaccine but still refuses, states that she needs more time -reasons for vaccination exemption provided to patient -instructed and encouraged to return if/when she is ready for vaccine, conveyed that we would be pleased to provided further counseling to discuss any other hesitations as needed, patient agreeable to this plan

## 2020-08-12 NOTE — Progress Notes (Signed)
    SUBJECTIVE:   CHIEF COMPLAINT / HPI:  Patient presents wanting an written excuse for exemption of COVID vaccination. She works remotely with the customer service department at [employer], her place of employment is requiring all employees get vaccinated or else they reserve the right to fire individuals. She has a multitude of hesitations. She does not report any adverse effects to the influenza vaccine which she has received three times previously. Her hesitations include that she is experiencing a lot of anxiety concerning the vaccine with it still being so new and is currently trying to get pregnancy so wants to avoid the vaccine. She said that she feels like she should not be forced to get the vaccine and that it should be purely her decision. She has discussed with her husband who seems that he would be more agreeable to the vaccine.    OBJECTIVE:   BP 110/80   Pulse 61   Ht 5\' 5"  (1.651 m)   Wt 202 lb (91.6 kg)   LMP 06/16/2020   SpO2 98%   BMI 33.61 kg/m   General: Patient well-appearing, in no acute distress. CV: RRR, no murmurs or gallops auscultated Resp: lungs clear to auscultation bilaterally Abdomen: soft, nontender, presence of active bowel sounds Ext: radial pulses strong and equal bilaterally  Psych: mood appropriate  ASSESSMENT/PLAN:   Vaccine counseling -extensive COVID vaccination counseling provided -patient seems more agreeable to vaccine but still refuses, states that she needs more time -reasons for vaccination exemption provided to patient -instructed and encouraged to return if/when she is ready for vaccine, conveyed that we would be pleased to provided further counseling to discuss any other hesitations as needed, patient agreeable to this plan   Med rec reviewed and updated accordingly.   14/11/2019, DO [facility]

## 2020-08-12 NOTE — Patient Instructions (Signed)
It was great seeing you today!  Today we discussed the COVID vaccine. I have provided information on exemption. Unfortunately, I cannot provide an exemption letter for the following circumstances.   Please follow up at your next scheduled appointment, if anything arises between now and then, please don't hesitate to contact our office.   Thank you for allowing [place] to be a part of your medical care!  Thank you, Dr. Robyne Peers

## 2020-08-16 ENCOUNTER — Ambulatory Visit: Payer: No Typology Code available for payment source

## 2020-08-18 NOTE — Progress Notes (Unsigned)
    SUBJECTIVE:   CHIEF COMPLAINT / HPI:   First COVID Vaccine: patient presents to clinic today to receive her first Covid vaccine.  Patient states she initially went edema during the vaccine because this is what her mom had.  She is very reluctant to take the vaccine due to all of the misinformation she is heard.  The patient asked many questions about Midrin and Pfizer vaccine, all of her questions were answered.  The patient agreed to have the Pfizer vaccine at today's visit.  Of note, the patient had COVID in January 2022, states she tested positive on January 15.   PERTINENT  PMH / PSH:  Patient Active Problem List   Diagnosis Date Noted  . Encounter for immunization 08/21/2020  . Vaccine counseling 08/12/2020  . Migraine without aura and with status migrainosus, not intractable 09/15/2019  . Foreign body sensation in throat 12/15/2017  . Seasonal allergies 11/19/2017  . Migraine without status migrainosus, not intractable 02/17/2017  . Generalized anxiety disorder 01/23/2016    OBJECTIVE:   BP 98/60   Pulse 61   Wt 202 lb (91.6 kg)   LMP 06/21/2020   SpO2 97%   BMI 33.61 kg/m    Physical exam: General: well-appearing, no apparent distress Respiratory: CTA bilaterally, comfortable work of breathing Cardio: RRR, S1-S2 present, no murmurs appreciated    ASSESSMENT/PLAN:   Encounter for immunization -Patient received her first Covid vaccine at today's visit.  She was observed for 15 minutes and tolerated vaccine well. -Was asked to schedule nurse visit appointment to receive second vaccine in about 3 weeks     Dollene Cleveland, DO [facility]

## 2020-08-20 ENCOUNTER — Other Ambulatory Visit: Payer: Self-pay

## 2020-08-20 ENCOUNTER — Ambulatory Visit (INDEPENDENT_AMBULATORY_CARE_PROVIDER_SITE_OTHER): Payer: No Typology Code available for payment source | Admitting: Family Medicine

## 2020-08-20 ENCOUNTER — Encounter: Payer: Self-pay | Admitting: Family Medicine

## 2020-08-20 VITALS — BP 98/60 | HR 61 | Wt 202.0 lb

## 2020-08-20 DIAGNOSIS — Z23 Encounter for immunization: Secondary | ICD-10-CM | POA: Diagnosis not present

## 2020-08-20 DIAGNOSIS — Z7185 Encounter for immunization safety counseling: Secondary | ICD-10-CM

## 2020-08-20 NOTE — Progress Notes (Signed)
   Covid-19 Vaccination Clinic  Name:  Deborah Mahoney    MRN: [id number] DOB: February 04, 1991  08/20/2020  Deborah Mahoney was observed post Covid-19 immunization for 15 minutes without incident. She was provided with Vaccine Information Sheet and instruction to access the V-Safe system.   Deborah Mahoney was instructed to call 911 with any severe reactions post vaccine: Marland Kitchen Difficulty breathing  . Swelling of face and throat  . A fast heartbeat  . A bad rash all over body  . Dizziness and weakness   #1 Covid Vaccine administered LD without complication.

## 2020-08-21 DIAGNOSIS — Z23 Encounter for immunization: Secondary | ICD-10-CM | POA: Insufficient documentation

## 2020-08-21 NOTE — Assessment & Plan Note (Signed)
-  Patient received her first Covid vaccine at today's visit.  She was observed for 15 minutes and tolerated vaccine well. -Was asked to schedule nurse visit appointment to receive second vaccine in about 3 weeks

## 2020-09-10 ENCOUNTER — Ambulatory Visit: Payer: No Typology Code available for payment source

## 2020-09-13 ENCOUNTER — Ambulatory Visit (INDEPENDENT_AMBULATORY_CARE_PROVIDER_SITE_OTHER): Payer: No Typology Code available for payment source

## 2020-09-13 ENCOUNTER — Other Ambulatory Visit: Payer: Self-pay

## 2020-09-13 DIAGNOSIS — Z23 Encounter for immunization: Secondary | ICD-10-CM

## 2020-09-30 ENCOUNTER — Ambulatory Visit: Payer: No Typology Code available for payment source | Attending: Critical Care Medicine

## 2020-09-30 DIAGNOSIS — Z20822 Contact with and (suspected) exposure to covid-19: Secondary | ICD-10-CM

## 2020-10-01 LAB — NOVEL CORONAVIRUS, NAA: SARS-CoV-2, NAA: NOT DETECTED

## 2020-10-01 LAB — SARS-COV-2, NAA 2 DAY TAT

## 2021-05-14 DIAGNOSIS — H5213 Myopia, bilateral: Secondary | ICD-10-CM | POA: Diagnosis not present

## 2021-06-20 ENCOUNTER — Telehealth: Payer: Self-pay

## 2021-06-20 NOTE — Telephone Encounter (Signed)
Pt called states she has had + UPT at home x 3.  LMP: 05/17/21  current [place]: [redacted]w[redacted]d Medicaid Insurance  Pt made aware she will be contacted by scheduling for NOB intake this afternoon due to scheduler not being in office at this time Pt voiced understanding.

## 2021-06-26 ENCOUNTER — Other Ambulatory Visit: Payer: Self-pay

## 2021-06-26 ENCOUNTER — Encounter: Payer: Self-pay | Admitting: Family Medicine

## 2021-06-26 ENCOUNTER — Ambulatory Visit (INDEPENDENT_AMBULATORY_CARE_PROVIDER_SITE_OTHER): Payer: Medicaid Other | Admitting: Family Medicine

## 2021-06-26 VITALS — BP 111/69 | HR 68 | Ht 65.0 in | Wt 205.4 lb

## 2021-06-26 DIAGNOSIS — Z3200 Encounter for pregnancy test, result unknown: Secondary | ICD-10-CM

## 2021-06-26 DIAGNOSIS — Z3201 Encounter for pregnancy test, result positive: Secondary | ICD-10-CM

## 2021-06-26 LAB — POCT URINE PREGNANCY: Preg Test, Ur: POSITIVE — AB

## 2021-06-26 MED ORDER — PRENATAL MULTIVITAMIN CH: 1.0000 | ORAL_TABLET | Freq: Every day | ORAL | 9 refills | Status: DC

## 2021-06-26 MED ORDER — DOXYLAMINE-PYRIDOXINE 10-10 MG PO TBEC: DELAYED_RELEASE_TABLET | ORAL | 0 refills | Status: DC

## 2021-06-26 NOTE — Patient Instructions (Signed)
Congratulations!! Your pregnancy test is positive!  We will draw some initial OB labs today and we will get you set up for an OB visit.  That note.  You are set up on the 27th at 8:30 AM   I have prescribed a prenatal vitamin for you to take each day.  I have also prescribed doxylamine-pyridoxine to use for nausea.  If you need anything in the meantime will hesitate to reach out to [place].

## 2021-06-26 NOTE — Progress Notes (Signed)
° ° °  SUBJECTIVE:   CHIEF COMPLAINT / HPI:   Confirm pregnancy test: Patient states she has taken 5 pregnancy tests at home which have been positive.  She presents today to confirm pregnancy.  She is not currently taking any medications.  She states she is excited about the possible pregnancy and her and her significant other been trying for some time for this.  She denies any other concerns other than some morning sickness.  She states that her periods of always been abnormal where she sometimes will have 1 followed by skipping a month but her last one was November 5.  She states that her previous pregnancy she did not have much morning sickness but has been a bit more significant so far during this pregnancy.  She denies other concerns at this time.  PERTINENT  PMH / PSH: History of migraine  OBJECTIVE:   BP 111/69    Pulse 68    Ht 5\' 5"  (1.651 m)    Wt 205 lb 6.4 oz (93.2 kg)    LMP 05/17/2021    SpO2 100%    BMI 34.18 kg/m    General: NAD, pleasant, able to participate in exam Respiratory: No respiratory distress Psych: Normal affect and mood   ASSESSMENT/PLAN:   Positive pregnancy test Pregnancy test performed in the office today is confirmed positive.  Patient is excited about the pregnancy and wishes to proceed with the pregnancy.  Will order initial obstetric labs and set patient up for her initial OB visit.  She is not currently on any medications contraindicated in pregnancy.  She has been having some nausea and so we will prescribe Diclegis.  Prescription for prenatal vitamin given.  She will likely need dating ultrasound as her menstrual cycles are irregular but she believes her last period was November 5.  Will order dating ultrasound.   03-07-1975, DO [facility]

## 2021-06-30 LAB — CBC/D/PLT+RPR+RH+ABO+RUBIGG...
Antibody Screen: NEGATIVE
Basophils Absolute: 0 10*3/uL (ref 0.0–0.2)
Basos: 0 %
EOS (ABSOLUTE): 0.1 10*3/uL (ref 0.0–0.4)
Eos: 1 %
HCV Ab: <0.1 s/co ratio (ref 0.0–0.9)
HIV Screen 4th Generation wRfx: NONREACTIVE
Hematocrit: 35.8 % (ref 34.0–46.6)
Hemoglobin: 12 g/dL (ref 11.1–15.9)
Hepatitis B Surface Ag: NEGATIVE
Immature Grans (Abs): 0 10*3/uL (ref 0.0–0.1)
Immature Granulocytes: 0 %
Lymphocytes Absolute: 2.1 10*3/uL (ref 0.7–3.1)
Lymphs: 29 %
MCH: 29.9 pg (ref 26.6–33.0)
MCHC: 33.5 g/dL (ref 31.5–35.7)
MCV: 89 fL (ref 79–97)
Monocytes Absolute: 0.5 10*3/uL (ref 0.1–0.9)
Monocytes: 7 %
Neutrophils Absolute: 4.5 10*3/uL (ref 1.4–7.0)
Neutrophils: 63 %
Platelets: 256 10*3/uL (ref 150–450)
RBC: 4.02 x10E6/uL (ref 3.77–5.28)
RDW: 11.9 % (ref 11.7–15.4)
RPR Ser Ql: NONREACTIVE
Rh Factor: POSITIVE
Rubella Antibodies, IGG: 2.63 index (ref >0.99)
WBC: 7.2 10*3/uL (ref 3.4–10.8)

## 2021-06-30 LAB — HGB FRACTIONATION CASCADE
Hgb A2: 2.5 % (ref 1.8–3.2)
Hgb A: 97.5 % (ref 96.4–98.8)
Hgb F: 0 % (ref 0.0–2.0)
Hgb S: 0 %

## 2021-06-30 LAB — URINE CULTURE, OB REFLEX

## 2021-06-30 LAB — HCV INTERPRETATION

## 2021-07-08 ENCOUNTER — Encounter: Payer: Self-pay | Admitting: Family Medicine

## 2021-07-08 ENCOUNTER — Other Ambulatory Visit: Payer: Self-pay

## 2021-07-08 ENCOUNTER — Ambulatory Visit (INDEPENDENT_AMBULATORY_CARE_PROVIDER_SITE_OTHER): Payer: Medicaid Other | Admitting: Family Medicine

## 2021-07-08 VITALS — BP 109/72 | HR 72 | Wt 210.2 lb

## 2021-07-08 DIAGNOSIS — Z3490 Encounter for supervision of normal pregnancy, unspecified, unspecified trimester: Secondary | ICD-10-CM | POA: Insufficient documentation

## 2021-07-08 DIAGNOSIS — Z3492 Encounter for supervision of normal pregnancy, unspecified, second trimester: Secondary | ICD-10-CM | POA: Insufficient documentation

## 2021-07-08 DIAGNOSIS — Z3401 Encounter for supervision of normal first pregnancy, first trimester: Secondary | ICD-10-CM

## 2021-07-08 NOTE — Progress Notes (Signed)
Patient Name: Deborah Mahoney Date of Birth: 1991/03/27 [facility] Initial Prenatal Visit  Deborah Mahoney is a 30 y.o. year old G1P1001 at Unknown who presents for her initial prenatal visit. Presents with husband and daughter.  Pregnancy is planned She reports nausea. She is taking a prenatal vitamin.  She denies pelvic pain or vaginal bleeding.   Pregnancy Dating: The patient is dated by LMP but needs dating [place] which is ordered.  LMP: 05/17/2021 Period is certain:  No.  Periods were regular:  No.  LMP was a typical period:  Yes.  Using hormonal contraception in 3 months prior to conception: No  Lab Review: Blood type: O Rh Status: Positive Antibody screen: Negative HIV: Non-reactive RPR: Non-reactive Hemoglobin electrophoresis reviewed: Yes Results of OB urine culture are: Negative Rubella: Immune Hep C Ab: Negative  Varicella status is Unknown  PMH: Reviewed and as detailed below: HTN: No  Gestational Hypertension/preeclampsia: No  Type 1 or 2 Diabetes: No  Depression:  No  Seizure disorder:  No VTE: No ,  History of STI No,  Abnormal Pap smear:  No, Genital herpes simplex:  No   PSH: Gynecologic Surgery:  no Surgical history reviewed, notable for: none  Obstetric History: Obstetric history tab updated and reviewed.  Summary of prior pregnancies: 1 Cesarean delivery: No  Gestational Diabetes:  No Hypertension in pregnancy: No History of preterm birth: No History of LGA/SGA infant:  No History of shoulder dystocia: No Indications for referral were reviewed, and the patient has no obstetric indications for referral to High Risk OB Clinic at this time.   Social History: Partner's name: Deborah Mahoney   Tobacco use: No Alcohol use:  No Other substance use:  No  Current Medications:  Prenatal vitamin   Reviewed and appropriate in pregnancy.   Genetic and Infection Screen: Flow Sheet Updated Yes  Prenatal Exam: Gen: Well nourished, well  developed.  No distress.  Vitals noted. HEENT: Normocephalic, atraumatic.  Neck supple without cervical lymphadenopathy, thyromegaly or thyroid nodules.  CV: RRR no murmur, gallops or rubs Lungs: CTA B.  Normal respiratory effort without wheezes or rales. Abd: soft, NTND. +BS.  Uterus not appreciated above pelvis. Ext: No clubbing, cyanosis or edema. Psych: Normal grooming and dress.  Not depressed or anxious appearing.  Normal thought content and process without flight of ideas or looseness of associations  Assessment/Plan:  Deborah Mahoney is a 29 y.o. G2P1001 at Unknown who presents to initiate prenatal care. She is doing well.  Current pregnancy issues include none.  Routine prenatal care: As dating is not reliable, a dating ultrasound has been ordered. Dating tab updated. Pre-pregnancy weight updated. Expected weight gain this pregnancy is 11-20 lbs.  Prenatal labs reviewed, no abnormalities noted. Indications for referral to [facility] were reviewed and the patient does not meet criteria for referral.  Medication list reviewed and updated.  Recommended patient see a dentist for regular care.  Bleeding and pain precautions reviewed. Importance of prenatal vitamins reviewed.  Genetic screening offered given family history of cystic fibrosis.  The patient does have an indication for aspirin therapy beginning at 12-16 weeks. Aspirin was  recommended today.  The patient will not age 69 or over at time of delivery. Referral to genetic counseling was offered today.  The patient has the following risk factors for preexisting diabetes: elevated BMI. An early 1 hour glucose tolerance test was not ordered. Pregnancy Medical Home and PHQ-9 forms completed, problems noted: No  2. Pregnancy issues include  the following which were addressed today:  Due for PAP and early glucola at next visit.  Patient qualifies for aspirin therapy. Aspirin to be started at [redacted] weeks gestation.  COVID booster and flu  vaccines extensively discussed.  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 Patient aware with instructions provided.  MAU precautions extensively discussed.    Follow up in 2 weeks for PAP smear and early glucola testing. Follow up in 4 weeks for next OB visit.

## 2021-07-08 NOTE — Patient Instructions (Signed)
It was great seeing you today!  Today we discussed your pregnancy, congratulations! Please make sure to take your prenatal vitamin daily. I strongly encourage getting the flu vaccine as well.   Go to the MA[facility] at [facility] if: You have pain in your lower abdomen or pelvic area Your water breaks.  Sometimes it is a big gush of fluid, sometimes it is just a trickle that keeps getting your underwear wet or running down your legs You have vaginal bleeding.   Please follow up at your next scheduled appointment in 2 weeks, if anything arises between now and then, please don't hesitate to contact our office.   Thank you for allowing [place] to be a part of your medical care!  Thank you, Dr. Robyne Peers

## 2021-07-13 NOTE — L&D Delivery Note (Signed)
[name] is a 31 y.o. female G2P1001 with IUP at [redacted]w[redacted]d admitted for spontaneous onset of labor.  She progressed without augmentation to complete and pushed to deliver.    At 2:31 AM a healthy female was delivered via Vaginal, Spontaneous (Presentation: Right Occiput Anterior).  APGAR: 7, 9; weight 9 lb 2 oz (4140 g).   Placenta status: Manual removal, Intact.  Cord: 3 vessels with the following complications: None.  Cord pH: not collected  Cord clamping delayed by several minutes then clamped by me and cut by Father of baby. Cord avulsed while under traction and Placenta extracted manually and was complete. Bleeding minimal. No laceration observed.  Mom and baby stable prior to transfer to postpartum. She plans on breastfeeding. She requests interval vasectomy for spouse for birth control.  Anesthesia: Epidural Episiotomy: None Lacerations: None Suture Repair:  n/a Est. Blood Loss (mL): 269  Mom to postpartum.  Baby to Couplet care / Skin to Skin.  [name] J Artur Winningham 02/22/2022, 2:58 AM

## 2021-07-22 ENCOUNTER — Other Ambulatory Visit (HOSPITAL_COMMUNITY)
Admission: RE | Admit: 2021-07-22 | Discharge: 2021-07-22 | Disposition: A | Discharge status: Home / Self Care | Payer: Medicaid Other | Source: Ambulatory Visit | Attending: Family Medicine | Admitting: Family Medicine

## 2021-07-22 ENCOUNTER — Ambulatory Visit (INDEPENDENT_AMBULATORY_CARE_PROVIDER_SITE_OTHER): Payer: Medicaid Other | Admitting: Family Medicine

## 2021-07-22 ENCOUNTER — Other Ambulatory Visit: Payer: Self-pay

## 2021-07-22 VITALS — BP 98/58 | HR 86 | Wt 209.0 lb

## 2021-07-22 DIAGNOSIS — Z3481 Encounter for supervision of other normal pregnancy, first trimester: Secondary | ICD-10-CM

## 2021-07-22 NOTE — Assessment & Plan Note (Signed)
-  continue prenatal vitamin daily -pending 1 hr early glucola testing, follow up as appropriate  -MAU precautions discussed -PAP with HPV cotesting performed, STI testing performed per patient request, will notify patient of any abnormal results -follow up in 2 weeks for next OB follow up visit

## 2021-07-22 NOTE — Progress Notes (Signed)
° ° °  SUBJECTIVE:   CHIEF COMPLAINT / HPI:   Patient presents for follow up OB visit to ensure early glucola testing and PAP smear. She is a G2P1001 at [redacted]w[redacted]d. Denies any concerns today, taking daily prenatal vitamin. Patient is agreeable to performing above testing as importance discussed in extensive detail.   OBJECTIVE:   BP (!) 98/58    Pulse 86    Wt 209 lb (94.8 kg)    LMP 05/17/2021 (Approximate)    BMI 34.78 kg/m   General: Patient well-appearing, in no acute distress. CV: RRR, no murmurs or gallops auscultated Resp: CTAB GU: normal labia without presence of rashes or external lesions, no cervical or vaginal discharge noted, no presence of polyps noted Psych: mood appropriate, pleasant   GU exam performed in the presence of chaperone.   ASSESSMENT/PLAN:   Supervision of normal pregnancy in first trimester -continue prenatal vitamin daily -pending 1 hr early glucola testing, follow up as appropriate  -MAU precautions discussed -PAP with HPV cotesting performed, STI testing performed per patient request, will notify patient of any abnormal results -follow up in 2 weeks for next OB follow up visit     Kameka Whan Robyne Peers, DO [facility]

## 2021-07-22 NOTE — Patient Instructions (Addendum)
It was great seeing you today!  Today we performed a PAP and an early glucose testing to ensure that you do not have undiagnosed diabetes. This way we can treat early which will help both you and baby during this pregnancy.   I will notify you of any abnormal results regarding your PAP smear. Based on your glucose testing, you may have to get follow up testing.   Please follow up at your next scheduled appointment in 2 weeks, if anything arises between now and then, please don't hesitate to contact our office.   Thank you for allowing [place] to be a part of your medical care!  Thank you, Dr. Larae Grooms

## 2021-07-23 LAB — CERVICOVAGINAL ANCILLARY ONLY
Chlamydia: NEGATIVE
Comment: NEGATIVE
Comment: NEGATIVE
Comment: NORMAL
Neisseria Gonorrhea: NEGATIVE
Trichomonas: NEGATIVE

## 2021-07-23 LAB — CYTOLOGY - PAP
Comment: NEGATIVE
Diagnosis: NEGATIVE
High risk HPV: NEGATIVE

## 2021-07-23 LAB — GLUCOSE TOLERANCE, 1 HOUR: Glucose, 1Hr PP: 61 mg/dL — ABNORMAL LOW (ref 70–199)

## 2021-08-07 ENCOUNTER — Ambulatory Visit
Admission: RE | Admit: 2021-08-07 | Discharge: 2021-08-07 | Disposition: A | Discharge status: Home / Self Care | Payer: Medicaid Other | Source: Ambulatory Visit | Attending: Family Medicine | Admitting: Family Medicine

## 2021-08-07 ENCOUNTER — Other Ambulatory Visit: Payer: Self-pay | Admitting: Family Medicine

## 2021-08-07 ENCOUNTER — Telehealth: Payer: Self-pay

## 2021-08-07 ENCOUNTER — Other Ambulatory Visit: Payer: Self-pay

## 2021-08-07 DIAGNOSIS — Z3201 Encounter for pregnancy test, result positive: Secondary | ICD-10-CM | POA: Diagnosis not present

## 2021-08-07 DIAGNOSIS — Z3A11 11 weeks gestation of pregnancy: Secondary | ICD-10-CM | POA: Diagnosis not present

## 2021-08-07 NOTE — Telephone Encounter (Signed)
Patient calls nurse line reporting experience at [facility] today. Patient reports she went for her [place] and was told "nothing." Patient reports the ultrasound tech would not show her the baby's heart beat nor did she tell her the due date.  Patient is anxious about the baby and overall experience. Patient would like for Ganta to call her once she has reviewed the ultrasound.

## 2021-08-11 ENCOUNTER — Encounter: Payer: No Typology Code available for payment source | Admitting: Obstetrics and Gynecology

## 2021-08-11 ENCOUNTER — Encounter: Payer: No Typology Code available for payment source | Admitting: Family Medicine

## 2021-08-18 ENCOUNTER — Ambulatory Visit (INDEPENDENT_AMBULATORY_CARE_PROVIDER_SITE_OTHER): Payer: Medicaid Other | Admitting: Family Medicine

## 2021-08-18 ENCOUNTER — Other Ambulatory Visit: Payer: Self-pay

## 2021-08-18 VITALS — BP 115/47 | HR 84 | Wt 220.0 lb

## 2021-08-18 DIAGNOSIS — Z3401 Encounter for supervision of normal first pregnancy, first trimester: Secondary | ICD-10-CM

## 2021-08-18 DIAGNOSIS — Z3492 Encounter for supervision of normal pregnancy, unspecified, second trimester: Secondary | ICD-10-CM

## 2021-08-18 MED ORDER — ASPIRIN EC 81 MG PO TBEC: 81.0000 mg | DELAYED_RELEASE_TABLET | Freq: Every day | ORAL | 8 refills | Status: DC

## 2021-08-18 NOTE — Progress Notes (Signed)
n Patient Name: Deborah Mahoney Date of Birth: 01/07/1991 [facility] Prenatal Visit  Deborah Mahoney is a 31 y.o. G2P1001 at [redacted]w[redacted]d here for routine follow up. She is dated by LMP and confirmed by dating ultrasound.  She reports no complaints.  She denies vaginal bleeding.  See flow sheet for details.  Vitals:   08/18/21 1516  BP: (!) 115/47  Pulse: 84     A/P: Pregnancy at [redacted]w[redacted]d.  Doing well.    Routine Prenatal Care:  Dating reviewed, dating tab is correct. Fetal heart tones appropriate. Influenza vaccine extensively discussed.  COVID vaccination was discussed and was previously administered.  The patient has the following indication for screening preexisting diabetes: elevated BMI and 1 hour glucola passed. Anatomy ultrasound will need to be ordered to be scheduled at 18-20 weeks. Patient is interested in genetic screening. Given family history of cystic fibrosis, patient referred to MFM for genetic counseling.  Pregnancy education including expected weight gain in pregnancy, OTC medication use, continued use of prenatal vitamin, smoking cessation if applicable, and nutrition in pregnancy.   Bleeding and pain precautions reviewed.  2. Pregnancy issues include the following and were addressed as appropriate today:  Aspirin started for preeclampsia prophylaxis, instructed patient to take this daily along with prenatal vitamin.  Problem list  and pregnancy box updated: Yes.   Follow up 4 weeks. Scheduled for OB faculty clinic visit on 3/9. MAU precautions discussed and given.

## 2021-08-18 NOTE — Patient Instructions (Addendum)
It was great seeing you today!  I am glad things are doing well! Please continue to take a prenatal vitamin daily. Please start taking aspirin 81 mg once daily to prevent preeclampsia.   Go to the MAU at [facility] if: You have cramping/contractions that do not go away with drinking water Your water breaks.  Sometimes it is a big gush of fluid, sometimes it is just a trickle that keeps getting your underwear wet or running down your legs You have vaginal bleeding.    You do not feel your baby moving like normal.  If you do not, get something to eat and drink and lay down and focus on feeling your baby move. If your baby is still not moving like normal, you should go to MAU.   Please follow up at your next scheduled appointment on 3/9 at 9 am, if anything arises between now and then, please don't hesitate to contact our office.   Thank you for allowing [place] to be a part of your medical care!  Thank you, Dr. Larae Grooms

## 2021-08-27 ENCOUNTER — Ambulatory Visit: Payer: Medicaid Other | Admitting: *Deleted

## 2021-08-27 ENCOUNTER — Other Ambulatory Visit: Payer: Self-pay

## 2021-08-27 VITALS — BP 105/72

## 2021-08-27 DIAGNOSIS — Z0130 Encounter for examination of blood pressure without abnormal findings: Secondary | ICD-10-CM

## 2021-09-01 ENCOUNTER — Other Ambulatory Visit: Payer: Self-pay

## 2021-09-01 ENCOUNTER — Ambulatory Visit (INDEPENDENT_AMBULATORY_CARE_PROVIDER_SITE_OTHER): Payer: Medicaid Other | Admitting: Family Medicine

## 2021-09-01 ENCOUNTER — Other Ambulatory Visit: Payer: Self-pay | Admitting: Family Medicine

## 2021-09-01 VITALS — BP 111/57 | HR 88 | Ht 65.0 in | Wt 219.4 lb

## 2021-09-01 DIAGNOSIS — Z3492 Encounter for supervision of normal pregnancy, unspecified, second trimester: Secondary | ICD-10-CM | POA: Diagnosis present

## 2021-09-01 DIAGNOSIS — G43001 Migraine without aura, not intractable, with status migrainosus: Secondary | ICD-10-CM

## 2021-09-01 DIAGNOSIS — Z3481 Encounter for supervision of other normal pregnancy, first trimester: Secondary | ICD-10-CM

## 2021-09-01 DIAGNOSIS — G43709 Chronic migraine without aura, not intractable, without status migrainosus: Secondary | ICD-10-CM | POA: Diagnosis not present

## 2021-09-01 DIAGNOSIS — Z1379 Encounter for other screening for genetic and chromosomal anomalies: Secondary | ICD-10-CM

## 2021-09-01 NOTE — Progress Notes (Signed)
Anatomy US order.  Fayette Pho, MD

## 2021-09-01 NOTE — Progress Notes (Signed)
° ° °  SUBJECTIVE:   CHIEF COMPLAINT / HPI:   Migraines with blurry vision - blurry vision started 2 weeks ago - Thursday migraine came then that Friday blurry vision - no diplopia, no field deficits - glassy type of blurring  - no pruritis - no RUQ pain - migraines, more often than before pregnant  PERTINENT  PMH / PSH:  Patient Active Problem List   Diagnosis Date Noted   Normal pregnancy, second trimester 07/08/2021   Encounter for immunization 08/21/2020   Vaccine counseling 08/12/2020   Migraine without aura and with status migrainosus, not intractable 09/15/2019   Foreign body sensation in throat 12/15/2017   Seasonal allergies 11/19/2017   Migraine without status migrainosus, not intractable 02/17/2017   Generalized anxiety disorder 01/23/2016    OBJECTIVE:   BP (!) 111/57    Pulse 88    Ht 5\' 5"  (1.651 m)    Wt 219 lb 6.4 oz (99.5 kg)    LMP 05/17/2021 (Approximate)    SpO2 99%    BMI 36.51 kg/m    PHQ-9:  Depression screen [facility] 2/9 08/18/2021 07/22/2021 07/08/2021  Decreased Interest 1 1 1   Down, Depressed, Hopeless 0 0 0  PHQ - 2 Score 1 1 1   Altered sleeping 2 0 0  Tired, decreased energy 2 1 1   Change in appetite 1 0 1  Feeling bad or failure about yourself  0 0 0  Trouble concentrating 0 0 0  Moving slowly or fidgety/restless 0 0 0  Suicidal thoughts 0 0 0  PHQ-9 Score 6 2 3   Difficult doing work/chores - - -  Some recent data might be hidden     GAD-7:  GAD 7 : Generalized Anxiety Score 01/25/2019  Nervous, Anxious, on Edge 3  Control/stop worrying 3  Worry too much - different things 3  Trouble relaxing 3  Restless 2  Easily annoyed or irritable 2  Afraid - awful might happen 3  Total GAD 7 Score 19  Anxiety Difficulty Somewhat difficult    Physical Exam General: Awake, alert, oriented HEENT: PERRL, EOM intact Cardiovascular: Regular rate and rhythm, S1 and S2 present, no murmurs auscultated Respiratory: Lung fields clear to auscultation  bilaterally Extremities: No bilateral lower extremity edema, palpable pedal and pretibial pulses bilaterally Neuro: Cranial nerves II through X grossly intact, able to move all extremities spontaneously, grip strength 5/5 and equal bilaterally, BUE and BLE strength 5/5 and equal bilaterally, no sensation deficits  ASSESSMENT/PLAN:   Migraine without aura and with status migrainosus, not intractable Chronic, worsening with pregnancy.  Now with blurry vision x2 weeks.  No red flag signs, neurological exam unremarkable.  No pruritus, RUQ pain, or hypertension at this time.  Patient prefers to avoid medication if possible.  Provided with list of foods that may trigger migraine to try and eliminate from her diet.  Advise she can use Tylenol and magnesium for migraine relief.  Return precautions given, see AVS for more  Normal pregnancy, second trimester Patient requests genetic screening ultrasound.  Upon further conversation, she clarifies that she does not want NIPS or carrier screening, she expressly wants the routine anatomy ultrasounds for detection of the trisomies.  She would like an ultrasound order placed and scheduled prior to her next upcoming prenatal appointment.  Order for anatomy ultrasound placed.      , MD [facility]

## 2021-09-01 NOTE — Patient Instructions (Addendum)
It was wonderful to meet you today. Thank you for allowing me to be a part of your care. Below is a short summary of what we discussed at your visit today:  Migraines Please see the attached papers that discussed common food triggers.  Start to eliminate some of these foods for at least a week at a time to see if this improves your headache.  You may use Tylenol 600 mg to relieve your headache. You may also take magnesium oxide 400 mg a day x5 days in conjunction with the Tylenol.  It is safe to take these medicines with the aspirin 81 mg daily that your doctor has already recommended you take.  Reasons to go to the MAU (maternal assessment unit, pregnancy emergency room) If you get a migraine that will not go away If you have a new type of headache different from your migraine with nausea or vomiting If you get the worst headache of your life  Maternity and women's care services located on the [place] side of [facility] [place]. [facility] (Entrance C off [address]  [address] [place],  [place]  [address]     Genetic ultrasound with the maternal-fetal medicine specialist We have sent a referral to the women Center.  Their information is below.  Please call them directly to move up your ultrasound appointment.    Please bring all of your medications to every appointment!  If you have any questions or concerns, please do not hesitate to contact us via phone or MyChart message.   Ezequiel Essex, MD

## 2021-09-05 ENCOUNTER — Telehealth: Payer: Self-pay | Admitting: *Deleted

## 2021-09-05 NOTE — Telephone Encounter (Signed)
Attempted to call patient. Unable to reach patient. Will complete [employer] form once I am aware of patient's preferences for screening.  Dorris Singh, MD  Family Medicine Teaching Service

## 2021-09-05 NOTE — Assessment & Plan Note (Signed)
Patient requests genetic screening ultrasound.  Upon further conversation, she clarifies that she does not want NIPS or carrier screening, she expressly wants the routine anatomy ultrasounds for detection of the trisomies.  She would like an ultrasound order placed and scheduled prior to her next upcoming prenatal appointment.  Order for anatomy ultrasound placed.

## 2021-09-05 NOTE — Telephone Encounter (Signed)
Called patient. Discussed genetic testing, reasons, indications. She does not  want genetic testing at this time--only wants to know fetal sex. Discussed ultrasound would be best, discussed limitations (may not see genitalia at first ultrasound).  Anatomy scan was previously ordered. Nursing--can you please schedule anatomy scan around 18 weeks (patient prefers around March 5-9)? Please call patient with time/date.  Please let me know if questions or if orders need changed.  Terisa Starr, MD  Family Medicine Teaching Service

## 2021-09-05 NOTE — Telephone Encounter (Signed)
Patient has been scheduled for 09-29-21 at 12:30pm.  She is aware of the time and date.  Deborah Mahoney,CMA

## 2021-09-05 NOTE — Assessment & Plan Note (Signed)
Chronic, worsening with pregnancy.  Now with blurry vision x2 weeks.  No red flag signs, neurological exam unremarkable.  No pruritus, RUQ pain, or hypertension at this time.  Patient prefers to avoid medication if possible.  Provided with list of foods that may trigger migraine to try and eliminate from her diet.  Advise she can use Tylenol and magnesium for migraine relief.  Return precautions given, see AVS for more

## 2021-09-05 NOTE — Telephone Encounter (Signed)
Patient left message on referral line stating that she is getting the "run around" on her genetic testing.  Patient called after her visit on 09-01-21 asking for the order for genetic testing to be placed.  Provider placed order for imaging but it appears to be the wrong order from what the patient is requesting.  The office note is still open and pending, so I am unable to see which screening she actually needs.  Will forward to ordering provider and preceptor to please reach out to patient and get the correct order placed.  I will be happy to call to get her scheduled once this has been taken care of.     Thanks [employer]

## 2021-09-11 ENCOUNTER — Other Ambulatory Visit: Payer: Self-pay

## 2021-09-11 ENCOUNTER — Encounter (HOSPITAL_COMMUNITY): Payer: Self-pay | Admitting: Obstetrics & Gynecology

## 2021-09-11 ENCOUNTER — Inpatient Hospital Stay (HOSPITAL_COMMUNITY)
Admission: AD | Admit: 2021-09-11 | Discharge: 2021-09-11 | Disposition: A | Discharge status: Home / Self Care | Payer: Medicaid Other | Attending: Obstetrics & Gynecology | Admitting: Obstetrics & Gynecology

## 2021-09-11 DIAGNOSIS — R102 Pelvic and perineal pain: Secondary | ICD-10-CM | POA: Insufficient documentation

## 2021-09-11 DIAGNOSIS — R109 Unspecified abdominal pain: Secondary | ICD-10-CM | POA: Insufficient documentation

## 2021-09-11 DIAGNOSIS — Z87440 Personal history of urinary (tract) infections: Secondary | ICD-10-CM | POA: Insufficient documentation

## 2021-09-11 DIAGNOSIS — Z87891 Personal history of nicotine dependence: Secondary | ICD-10-CM | POA: Insufficient documentation

## 2021-09-11 DIAGNOSIS — Z7982 Long term (current) use of aspirin: Secondary | ICD-10-CM | POA: Insufficient documentation

## 2021-09-11 DIAGNOSIS — O26892 Other specified pregnancy related conditions, second trimester: Secondary | ICD-10-CM | POA: Insufficient documentation

## 2021-09-11 DIAGNOSIS — Z3A16 16 weeks gestation of pregnancy: Secondary | ICD-10-CM

## 2021-09-11 DIAGNOSIS — N949 Unspecified condition associated with female genital organs and menstrual cycle: Secondary | ICD-10-CM

## 2021-09-11 LAB — URINALYSIS, ROUTINE W REFLEX MICROSCOPIC
Bilirubin Urine: NEGATIVE
Glucose, UA: NEGATIVE mg/dL
Hgb urine dipstick: NEGATIVE
Ketones, ur: NEGATIVE mg/dL
Leukocytes,Ua: NEGATIVE
Nitrite: NEGATIVE
Protein, ur: NEGATIVE mg/dL
Specific Gravity, Urine: 1.016 (ref 1.005–1.030)
pH: 7 (ref 5.0–8.0)

## 2021-09-11 NOTE — MAU Note (Signed)
.  Deborah Mahoney is a 31 y.o. at [redacted]w[redacted]d here in MAU reporting: right sided abdominal pain that started at 1000 this morning that comes and goes. She states that it was a 7/10 this morning but now it is a 2/10. Denies VB or LOF.  ? ?Onset of complaint: 1000 ?Pain score: 2 ?Lab orders placed from triage:  UA ?

## 2021-09-11 NOTE — MAU Provider Note (Signed)
?History  ?  ? ?[id number] ? ?Arrival date and time: 09/11/21 1451 ?  ? ?Chief Complaint  ?Patient presents with  ? Abdominal Pain  ? ? ? ?HPI ?Deborah Mahoney is a 31 y.o. at [redacted]w[redacted]d by 11 wk [place] with PMHx notable for GAD, migraines, who presents for abdominal pain.  ? ?Patient is currently in the process of moving ?She has not been exerting herself to any great degree but a few hours earlier she suddenly had sharp pains in her R side ?The pains were sharp, brief, coming and going ?Did not radiate into pubic bone or vagina ?No vaginal bleeding or leaking fluid ?No contractions ?No burning or pain with urination ?No vaginal discharge ?No nausea or vomiting ?No sick contacts ?She is very worried about miscarriage and that this might be a sign of impending SAB ? ?O/Positive/-- (12/15 1608) ? ?OB History   ? ? Gravida  ?2  ? Para  ?1  ? Term  ?1  ? Preterm  ?   ? AB  ?   ? Living  ?1  ?  ? ? SAB  ?   ? IAB  ?   ? Ectopic  ?   ? Multiple  ?   ? Live Births  ?1  ?   ?  ?  ? ? ?Past Medical History:  ?Diagnosis Date  ? Anemia   ? Infection   ? UTI  ? Seasonal allergies   ? ? ?Past Surgical History:  ?Procedure Laterality Date  ? NO PAST SURGERIES    ? ? ?Family History  ?Problem Relation Age of Onset  ? Hypertension Mother   ? Hyperlipidemia Father   ? Hypertension Father   ? Hearing loss Neg Hx   ? ? ?Social History  ? ?Socioeconomic History  ? Marital status: Single  ?  Spouse name: Not on file  ? Number of children: Not on file  ? Years of education: Not on file  ? Highest education level: Not on file  ?Occupational History  ? Not on file  ?Tobacco Use  ? Smoking status: Former  ? Smokeless tobacco: Former  ?Substance and Sexual Activity  ? Alcohol use: Yes  ?  Comment: occ stoppped with pregnancy  ? Drug use: Yes  ?  Types: Marijuana  ?  Comment: stopped when found out pregnant  ? Sexual activity: Yes  ?  Birth control/protection: None  ?Other Topics Concern  ? Not on file  ?Social History Narrative  ? Not on file  ? ?Social  Determinants of Health  ? ?Financial Resource Strain: Not on file  ?Food Insecurity: Not on file  ?Transportation Needs: Not on file  ?Physical Activity: Not on file  ?Stress: Not on file  ?Social Connections: Not on file  ?Intimate Partner Violence: Not on file  ? ? ?No Known Allergies ? ?No current facility-administered medications on file prior to encounter.  ? ?Current Outpatient Medications on File Prior to Encounter  ?Medication Sig Dispense Refill  ? aspirin EC 81 MG tablet Take 1 tablet (81 mg total) by mouth daily. Swallow whole. 30 tablet 8  ? Prenatal Vit-Fe Fumarate-FA (PRENATAL MULTIVITAMIN) TABS tablet Take 1 tablet by mouth daily at 12 noon. 30 tablet 9  ? Doxylamine-Pyridoxine 10-10 MG TBEC Take 2 tablets orally at bedtime on day 1 and 2; if symptoms persist, take 1 tablet in morning and 2 tablets at bedtime on day 3; if symptoms persist, may increase to MAX 4 tablets per  day, administered as 1 tablet in the morning, 1 tablet in mid-afternoon and 2 tablets at bedtim 60 tablet 0  ? ? ? ?ROS ?Pertinent positives and negative per HPI, all others reviewed and negative ? ?Physical Exam  ? ?BP 117/74 (BP Location: Right Arm)   Pulse 93   Temp 98.4 ?F (36.9 ?C) (Oral)   Resp 18   Wt 101.9 kg   LMP 05/17/2021 (Approximate)   BMI 37.39 kg/m?  ? ?Patient Vitals for the past 24 hrs: ? BP Temp Temp src Pulse Resp Weight  ?09/11/21 1513 117/74 98.4 ?F (36.9 ?C) Oral 93 18 --  ?09/11/21 1504 -- -- -- -- -- 101.9 kg  ? ? ?Physical Exam ?Vitals reviewed.  ?Constitutional:   ?   General: She is not in acute distress. ?   Appearance: She is well-developed. She is not diaphoretic.  ?Eyes:  ?   General: No scleral icterus. ?Pulmonary:  ?   Effort: Pulmonary effort is normal. No respiratory distress.  ?Abdominal:  ?   General: There is no distension.  ?   Palpations: Abdomen is soft.  ?   Tenderness: There is no abdominal tenderness. There is no guarding or rebound.  ?Skin: ?   General: Skin is warm and dry.   ?Neurological:  ?   Mental Status: She is alert.  ?   Coordination: Coordination normal.  ?  ? ?Cervical Exam ?  ? ?Bedside Ultrasound ?Not done ? ?My interpretation: n/a ? ?FHT ?145 bpm by doppler ? ?Labs ?Results for orders placed or performed during the hospital encounter of 09/11/21 (from the past 24 hour(s))  ?Urinalysis, Routine w reflex microscopic Urine, Clean Catch     Status: None  ? Collection Time: 09/11/21  3:13 PM  ?Result Value Ref Range  ? Color, Urine YELLOW YELLOW  ? APPearance CLEAR CLEAR  ? Specific Gravity, Urine 1.016 1.005 - 1.030  ? pH 7.0 5.0 - 8.0  ? Glucose, UA NEGATIVE NEGATIVE mg/dL  ? Hgb urine dipstick NEGATIVE NEGATIVE  ? Bilirubin Urine NEGATIVE NEGATIVE  ? Ketones, ur NEGATIVE NEGATIVE mg/dL  ? Protein, ur NEGATIVE NEGATIVE mg/dL  ? Nitrite NEGATIVE NEGATIVE  ? Leukocytes,Ua NEGATIVE NEGATIVE  ? ? ?Imaging ?No results found. ? ?MAU Course  ?Procedures ?Lab Orders    ?     Urinalysis, Routine w reflex microscopic Urine, Clean Catch    ?No orders of the defined types were placed in this encounter. ? ?Imaging Orders  ?No imaging studies ordered today  ? ? ?MDM ?Minimal ? ?Assessment and Plan  ?#Round ligament pain ?#[redacted] weeks gestation of pregnancy ?No red flag symptoms, exam and hx c/w round ligament pain and advancing gestational age. Reassured patient, reviewed anatomical diagrams, discussed maternity belts/position for comfort.  ? ?#FWB ?Normal FHR by doppler ? ? ?Dispo: discharged to home in stable condition. ? ? ? ?Venora Maples, MD/MPH ?09/11/21 ?3:47 PM ? ?Allergies as of 09/11/2021   ?No Known Allergies ?  ? ?  ?Medication List  ?  ? ?TAKE these medications   ? ?aspirin EC 81 MG tablet ?Take 1 tablet (81 mg total) by mouth daily. Swallow whole. ?  ?Doxylamine-Pyridoxine 10-10 MG Tbec ?Take 2 tablets orally at bedtime on day 1 and 2; if symptoms persist, take 1 tablet in morning and 2 tablets at bedtime on day 3; if symptoms persist, may increase to MAX 4 tablets per day,  administered as 1 tablet in the morning, 1 tablet in mid-afternoon and  2 tablets at bedtim ?  ?prenatal multivitamin Tabs tablet ?Take 1 tablet by mouth daily at 12 noon. ?  ? ?  ? ? ?

## 2021-09-18 ENCOUNTER — Ambulatory Visit (INDEPENDENT_AMBULATORY_CARE_PROVIDER_SITE_OTHER): Payer: Medicaid Other | Admitting: Family Medicine

## 2021-09-18 ENCOUNTER — Other Ambulatory Visit: Payer: Self-pay

## 2021-09-18 VITALS — BP 110/77 | HR 103 | Wt 223.4 lb

## 2021-09-18 DIAGNOSIS — Z23 Encounter for immunization: Secondary | ICD-10-CM

## 2021-09-18 DIAGNOSIS — Z3492 Encounter for supervision of normal pregnancy, unspecified, second trimester: Secondary | ICD-10-CM

## 2021-09-18 DIAGNOSIS — Z8349 Family history of other endocrine, nutritional and metabolic diseases: Secondary | ICD-10-CM | POA: Insufficient documentation

## 2021-09-18 NOTE — Patient Instructions (Signed)
It was great seeing you today! ? ?I am glad that your pregnancy is going well!  ? ?Please follow up at your next scheduled appointment in 4 weeks, if anything arises between now and then, please don't hesitate to contact our office. ? ?Go to the MAU at [facility] if: ?You have cramping/contractions that do not go away with drinking water ?Your water breaks.  Sometimes it is a big gush of fluid, sometimes it is just a trickle that keeps getting your underwear wet or running down your legs ?You have vaginal bleeding.    ?You do not feel your baby moving like normal.  If you do not, get something to eat and drink and lay down and focus on feeling your baby move. If your baby is still not moving like normal, you should go to MAU.  ? ? ?Thank you for allowing [place] to be a part of your medical care! ? ?Thank you, ?Dr. Robyne Peers  ?

## 2021-09-18 NOTE — Progress Notes (Signed)
?  Patient Name: [name] ?Date of Birth: 1991-03-28 ?[facility] Prenatal Visit ? ?Deborah Mahoney is a 31 y.o. G2P1001 at [redacted]w[redacted]d here for routine follow up. She is dated by LMP and confirmed by dating ultrasound.  She reports no complaints.  She denies vaginal bleeding.  See flow sheet for details. ? ?Vitals:  ? 09/18/21 0912  ?BP: 110/77  ?Pulse: (!) 103  ? ? ? ?A/P: Pregnancy at [redacted]w[redacted]d.  Doing well.   ? ?Routine Prenatal Care:  ?Dating reviewed, dating tab is correct. ?Fetal heart tones appropriate. ?Influenza vaccine administered today. ?COVID vaccination was extensively discussed and patient will consider for possible administration at next visit.  ?The patient has an indication for screening preexisting diabetes: 1 hr glucola previously performed and passed  ?Anatomy ultrasound ordered to be scheduled at 18-20 weeks, scheduled for 3/20. ?Patient is interested in genetic screening. She was previously referred to genetics at initial prenatal visit given patient's mother has a history of cystic fibrosis.   ?Pregnancy education including expected weight gain in pregnancy, OTC medication use, continued use of prenatal vitamin, smoking cessation if applicable, and nutrition in pregnancy.   ?Bleeding and pain precautions reviewed. ? ?2. Pregnancy issues include the following and were addressed as appropriate today: ? ?History of anxiety: Doing well, partner continues to be a significant source of support. Currently moving to a family friend's place and hoping to be back at their place by the time baby is born.  ?Extensively discussed benefits of COVID booster. ?Continue aspirin 81 mg for preeclampsia prevention.  ?Problem list  and pregnancy box updated: Yes.  ? ?Follow up 4 weeks. ? ? ? ?

## 2021-09-29 ENCOUNTER — Other Ambulatory Visit: Payer: Self-pay | Admitting: Family Medicine

## 2021-09-29 ENCOUNTER — Other Ambulatory Visit: Payer: Self-pay | Admitting: *Deleted

## 2021-09-29 ENCOUNTER — Ambulatory Visit: Payer: Medicaid Other | Attending: Family Medicine

## 2021-09-29 ENCOUNTER — Other Ambulatory Visit: Payer: Self-pay

## 2021-09-29 ENCOUNTER — Encounter: Payer: Self-pay | Admitting: Family Medicine

## 2021-09-29 DIAGNOSIS — O99212 Obesity complicating pregnancy, second trimester: Secondary | ICD-10-CM | POA: Insufficient documentation

## 2021-09-29 DIAGNOSIS — Z6837 Body mass index (BMI) 37.0-37.9, adult: Secondary | ICD-10-CM

## 2021-09-29 DIAGNOSIS — Z1379 Encounter for other screening for genetic and chromosomal anomalies: Secondary | ICD-10-CM | POA: Diagnosis not present

## 2021-09-29 DIAGNOSIS — Z3A19 19 weeks gestation of pregnancy: Secondary | ICD-10-CM | POA: Insufficient documentation

## 2021-09-29 DIAGNOSIS — Z363 Encounter for antenatal screening for malformations: Secondary | ICD-10-CM | POA: Insufficient documentation

## 2021-09-29 DIAGNOSIS — O321XX0 Maternal care for breech presentation, not applicable or unspecified: Secondary | ICD-10-CM | POA: Diagnosis not present

## 2021-09-29 DIAGNOSIS — Z3492 Encounter for supervision of normal pregnancy, unspecified, second trimester: Secondary | ICD-10-CM | POA: Diagnosis not present

## 2021-09-29 DIAGNOSIS — Z362 Encounter for other antenatal screening follow-up: Secondary | ICD-10-CM

## 2021-10-03 ENCOUNTER — Ambulatory Visit: Payer: Self-pay | Admitting: *Deleted

## 2021-10-03 ENCOUNTER — Telehealth: Payer: Self-pay

## 2021-10-03 NOTE — Telephone Encounter (Signed)
Patient calls nurse line reporting allergies and is ~[redacted] weeks pregnant. ? ?Patient reports congestion, watery eyes and runny nose. Patient denies fevers, SOB or cough.  ? ?Patient advised she can use Zyrtec or Claritin, as well as saline drops or prescription Flonase.  ? ?Patient reports she has Xyzal at home and would like to know if this is a safe option.  ? ?Patient would like a prescription for Flonase. ? ?Will forward to PCP.  ?

## 2021-10-03 NOTE — Telephone Encounter (Signed)
Patient updated.  ? ?Patient reports she will try OTC Zyrtec and Claritin.  ? ?If no relief she will call and make an apt next week.  ?

## 2021-10-03 NOTE — Telephone Encounter (Signed)
?  Chief Complaint: Can she take Zyrtec while [redacted] weeks pregnant.   Her OB told her she could take Claritin earlier today. ?Symptoms: allergies ?Frequency: N/A ?Pertinent Negatives: Patient denies N/A ?Disposition: [] ED /[] Urgent Care (no appt availability in office) / [] Appointment(In office/virtual)/ []  [facility]/ [x] Home Care/ [] Refused Recommended Disposition /[] [place] [facility]/ []  Follow-up with PCP ?Additional Notes: Referred to OB dr. Since pregnant.  ?

## 2021-10-03 NOTE — Telephone Encounter (Signed)
Reason for Disposition ? [1] Caller has medicine question about med NOT prescribed by PCP AND [2] triager unable to answer question (e.g., compatibility with other med, storage) ? ?Answer Assessment - Initial Assessment Questions ?1. NAME of MEDICATION: "What medicine are you calling about?" ?    I will be [redacted] weeks pregnant tomorrow.  I called my dr earlier.    ?2. QUESTION: "What is your question?" (e.g., double dose of medicine, side effect) ?    Can I take Zyrtec while pregnant?  My dr. Catalina Pizza me I could take Claritin with my pregnancy but I was wondering about the Zyrtec.    ?I referred her back to her OB dr. Since she is pregnant and go with their advice. ?3. PRESCRIBING HCP: "Who prescribed it?" Reason: if prescribed by specialist, call should be referred to that group. ?    OTC medication for allergies ?4. SYMPTOMS: "Do you have any symptoms?" ?    Not asked ?5. SEVERITY: If symptoms are present, ask "Are they mild, moderate or severe?" ?    Not asked ?6. PREGNANCY:  "Is there any chance that you are pregnant?" "When was your last menstrual period?" ?    Yes 20 weeks ? ?Protocols used: Medication Question Call-A-AH ? ?

## 2021-10-29 ENCOUNTER — Ambulatory Visit: Payer: Medicaid Other | Attending: Obstetrics and Gynecology

## 2021-10-29 ENCOUNTER — Ambulatory Visit: Payer: Medicaid Other | Admitting: *Deleted

## 2021-10-29 ENCOUNTER — Other Ambulatory Visit: Payer: Self-pay | Admitting: *Deleted

## 2021-10-29 ENCOUNTER — Encounter: Payer: Self-pay | Admitting: *Deleted

## 2021-10-29 VITALS — BP 119/69 | HR 80

## 2021-10-29 DIAGNOSIS — Z362 Encounter for other antenatal screening follow-up: Secondary | ICD-10-CM

## 2021-10-29 DIAGNOSIS — E669 Obesity, unspecified: Secondary | ICD-10-CM

## 2021-10-29 DIAGNOSIS — Z3A23 23 weeks gestation of pregnancy: Secondary | ICD-10-CM

## 2021-10-29 DIAGNOSIS — O99212 Obesity complicating pregnancy, second trimester: Secondary | ICD-10-CM

## 2021-10-29 DIAGNOSIS — Z6837 Body mass index (BMI) 37.0-37.9, adult: Secondary | ICD-10-CM | POA: Diagnosis not present

## 2021-10-29 DIAGNOSIS — Z8349 Family history of other endocrine, nutritional and metabolic diseases: Secondary | ICD-10-CM

## 2021-11-06 ENCOUNTER — Ambulatory Visit (INDEPENDENT_AMBULATORY_CARE_PROVIDER_SITE_OTHER): Payer: Medicaid Other | Admitting: Family Medicine

## 2021-11-06 VITALS — BP 109/56 | HR 86 | Wt 233.6 lb

## 2021-11-06 DIAGNOSIS — Z3492 Encounter for supervision of normal pregnancy, unspecified, second trimester: Secondary | ICD-10-CM

## 2021-11-06 NOTE — Progress Notes (Signed)
?  [facility] Prenatal Visit ? ?Deborah Mahoney is a 31 y.o. G2P1001 at [redacted]w[redacted]d here for routine follow up. She is dated by LMP and confirmed by ultrasound.  She reports no bleeding, no cramping, and no leaking.  She reports good fetal movement. No bleeding, loss of fluid, contractions. See flow sheet for details. ?Vitals:  ? 11/06/21 1500  ?BP: (!) 109/56  ?Pulse: 86  ? ? ? ?A/P: Pregnancy at [redacted]w[redacted]d.  Doing well.   ?Dating reviewed, dating tab is correct. ?Fetal heart tones appropriate. ?Fundal height appropriate. ?Anatomy ultrasound reviewed and notable to be unremarkable.  ?Influenza vaccine previously administered.  ?COVID vaccination was again extensively discussed and patient politely declines.  ?Completed prediabetes screening and discussed with patient that we will perform glucola to test for gestational diabetes at the next visit.  ?Pregnancy education provided on the following topics: fetal growth and movement, ultrasound assessment, and upcoming laboratory assessment.   ?Completed [facility] during second trimester on 09/18/2021. ?Preterm labor precautions given.  ? ?2. Pregnancy issues include the following and were addressed as appropriate today: ? ? Instructed to continue to take aspirin and prenatal vitamin daily. Encouraged partner to get tested to determine if he is a CF carrier to better predict if baby's changes of developing CF.  ? Problem list and pregnancy box updated: Yes.  ? ?Plan for glucola and blood work at next visit which will be at [redacted] weeks gestation.  ? ?Follow up 4 weeks. ? ? ? ?

## 2021-11-06 NOTE — Patient Instructions (Signed)
It was great seeing you today! ? ?I am glad you are doing well! Please continue to take the prenatal vitamin and aspirin daily. ? ?Go to the MAU at [facility] at [facility] if: ?You have cramping/contractions that do not go away with drinking water ?Your water breaks.  Sometimes it is a big gush of fluid, sometimes it is just a trickle that keeps getting your underwear wet or running down your legs ?You have vaginal bleeding.    ?You do not feel your baby moving like normal.  If you do not, get something to eat and drink and lay down and focus on feeling your baby move. If your baby is still not moving like normal, you should go to MAU.  ? ?Please follow up at your next scheduled appointment in 4 weeks, if anything arises between now and then, please don't hesitate to contact our office. ? ? ?Thank you for allowing [place] to be a part of your medical care! ? ?Thank you, ?Dr. Robyne Peers  ?

## 2021-12-10 ENCOUNTER — Other Ambulatory Visit: Payer: Self-pay

## 2021-12-10 ENCOUNTER — Ambulatory Visit (INDEPENDENT_AMBULATORY_CARE_PROVIDER_SITE_OTHER): Payer: Medicaid Other | Admitting: Family Medicine

## 2021-12-10 VITALS — BP 101/59 | HR 87 | Wt 233.4 lb

## 2021-12-10 DIAGNOSIS — Z3493 Encounter for supervision of normal pregnancy, unspecified, third trimester: Secondary | ICD-10-CM

## 2021-12-10 DIAGNOSIS — Z3A29 29 weeks gestation of pregnancy: Secondary | ICD-10-CM | POA: Diagnosis not present

## 2021-12-10 DIAGNOSIS — Z3492 Encounter for supervision of normal pregnancy, unspecified, second trimester: Secondary | ICD-10-CM

## 2021-12-10 NOTE — Patient Instructions (Signed)
It was great seeing you today!  Today we discussed your pregnancy, I am so glad that things are doing well! Please make sure to take both your aspirin and prenatal vitamin daily as we discussed. Please consider the Tdap vaccination as this will keep baby healthy as we talked about.   Go to the MAU at [facility] at [facility] if: You begin to have strong, frequent contractions Your water breaks.  Sometimes it is a big gush of fluid, sometimes it is just a trickle that keeps getting your underwear wet or running down your legs You have vaginal bleeding.  It is normal to have a small amount of spotting if your cervix was checked.  You do not feel your baby moving like normal.  If you do not, get something to eat and drink and lay down and focus on feeling your baby move.   If your baby is still not moving like normal, you should go to MAU.   Please follow up at your next scheduled appointment in 2 weeks, if anything arises between now and then, please don't hesitate to contact our office.   Thank you for allowing [place] to be a part of your medical care!  Thank you, Dr. Robyne Peers

## 2021-12-10 NOTE — Progress Notes (Signed)
  [facility] Prenatal Visit  Deborah Mahoney is a 31 y.o. G2P1001 at [redacted]w[redacted]d here for routine follow up. She is dated by LMP and confirmed by ultrasound.  She reports no complaints. She reports fetal movement. She denies vaginal bleeding, contractions, or loss of fluid. See flow sheet for details.  Vitals:   12/10/21 0909  BP: (!) 101/59  Pulse: 87      A/P: Pregnancy at [redacted]w[redacted]d.  Doing well.   Routine prenatal care:  Dating reviewed, dating tab is correct.  Fetal heart tones appropriate. Fundal height appropriate. Infant feeding choice: Breastfeeding  Contraception choice: Undecided, possible BTL Infant circumcision desired: N/A, it's a girl   The patient does not have a history of C-section.  Influenza vaccine not given as it is not flu season.  Tdap was not given today. Discussed extensively with patient on benefits, she will consider and hopefully get at next visit.   1 hour glucola, CBC, RPR, and HIV were obtained today.    Rh status was reviewed and patient does not need Rhogam.  Rhogam was not given today.  Pregnancy medical home and PHQ-9 forms were done today and reviewed.   Childbirth and education classes were offered. Pregnancy education regarding benefits of breastfeeding, contraception, fetal growth, expected weight gain, and safe infant sleep were discussed.  Preterm labor and fetal movement precautions reviewed.  2. 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 PHQ-9 score of 3 with negative question 9 reviewed, waterbirth  Problem list and pregnancy box updated: Yes.   -PHQ-9 score of 3 with negative question 9 reviewed.  -Patient discussed preference for waterbirth, I explained that if this is the case then she would need to be seen by OB for the remainder of her pregnancy. She will take time to make a decision so we can discuss at next visit so referral can be  placed if needed.   Patient scheduled in [facility] during third trimester on 6/15.  Follow up 2 weeks.

## 2021-12-11 ENCOUNTER — Other Ambulatory Visit: Payer: Self-pay | Admitting: Family Medicine

## 2021-12-11 ENCOUNTER — Encounter: Payer: Self-pay | Admitting: Family Medicine

## 2021-12-11 DIAGNOSIS — O99013 Anemia complicating pregnancy, third trimester: Secondary | ICD-10-CM

## 2021-12-11 LAB — CBC
Hematocrit: 27.4 % — ABNORMAL LOW (ref 34.0–46.6)
Hemoglobin: 9.3 g/dL — ABNORMAL LOW (ref 11.1–15.9)
MCH: 29.6 pg (ref 26.6–33.0)
MCHC: 33.9 g/dL (ref 31.5–35.7)
MCV: 87 fL (ref 79–97)
Platelets: 226 10*3/uL (ref 150–450)
RBC: 3.14 x10E6/uL — ABNORMAL LOW (ref 3.77–5.28)
RDW: 12.2 % (ref 11.7–15.4)
WBC: 8.4 10*3/uL (ref 3.4–10.8)

## 2021-12-11 LAB — HIV ANTIBODY (ROUTINE TESTING W REFLEX): HIV Screen 4th Generation wRfx: NONREACTIVE

## 2021-12-11 LAB — RPR: RPR Ser Ql: NONREACTIVE

## 2021-12-11 LAB — GLUCOSE TOLERANCE, 1 HOUR: Glucose, 1Hr PP: 66 mg/dL — ABNORMAL LOW (ref 70–199)

## 2021-12-11 MED ORDER — FERROUS SULFATE 325 (65 FE) MG PO TABS: 325.0000 mg | ORAL_TABLET | ORAL | 0 refills | Status: DC

## 2021-12-16 ENCOUNTER — Encounter: Payer: Self-pay | Admitting: *Deleted

## 2021-12-31 ENCOUNTER — Ambulatory Visit (INDEPENDENT_AMBULATORY_CARE_PROVIDER_SITE_OTHER): Payer: Medicaid Other | Admitting: Certified Nurse Midwife

## 2021-12-31 ENCOUNTER — Encounter: Payer: Self-pay | Admitting: Certified Nurse Midwife

## 2021-12-31 ENCOUNTER — Other Ambulatory Visit: Payer: Self-pay

## 2021-12-31 VITALS — BP 108/68 | HR 93 | Wt 236.1 lb

## 2021-12-31 DIAGNOSIS — Z3A32 32 weeks gestation of pregnancy: Secondary | ICD-10-CM

## 2021-12-31 DIAGNOSIS — Z3493 Encounter for supervision of normal pregnancy, unspecified, third trimester: Secondary | ICD-10-CM

## 2021-12-31 DIAGNOSIS — O0933 Supervision of pregnancy with insufficient antenatal care, third trimester: Secondary | ICD-10-CM

## 2021-12-31 DIAGNOSIS — F411 Generalized anxiety disorder: Secondary | ICD-10-CM

## 2021-12-31 DIAGNOSIS — O99013 Anemia complicating pregnancy, third trimester: Secondary | ICD-10-CM

## 2021-12-31 MED ORDER — GOJJI WEIGHT SCALE MISC: 1.0000 | 0 refills | Status: DC | PRN

## 2021-12-31 MED ORDER — BLOOD PRESSURE KIT DEVI: 1.0000 | 0 refills | Status: DC | PRN

## 2021-12-31 NOTE — Patient Instructions (Signed)

## 2021-12-31 NOTE — Progress Notes (Signed)
History:   Deborah Mahoney is a 31 y.o. G2P1001 at [redacted]w[redacted]d by LMP being seen today for her first obstetrical visit.  Her obstetrical history is significant for  vaginal birth of an 8lb 2oz baby 8rs ago . Patient does intend to breast feed. Pregnancy history fully reviewed.  Patient reports no complaints. Transferred to [place] from [facility], wanting midwifery and more consistent care.       HISTORY: OB History  Gravida Para Term Preterm AB Living  2 1 1  0 0 1  SAB IAB Ectopic Multiple Live Births  0 0 0 0 1    # Outcome Date GA Lbr Len/2nd Weight Sex Delivery Anes PTL Lv  2 Current           1 Term 11/04/13 [redacted]w[redacted]d 35:02 / 03:18 8 lb 2 oz (3.685 kg) F Vag-Spont EPI  LIV     Name: Deborah Mahoney     Apgar1: 8  Apgar5: 9    Last pap smear was done 07/22/21 and was normal  Past Medical History:  Diagnosis Date   Anemia    Infection    UTI   Migraine without status migrainosus, not intractable 02/17/2017   Seasonal allergies    Past Surgical History:  Procedure Laterality Date   NO PAST SURGERIES     Family History  Problem Relation Age of Onset   Hypertension Mother    Hyperlipidemia Father    Hypertension Father    Hearing loss Neg Hx    Social History   Tobacco Use   Smoking status: Former   Smokeless tobacco: Former  Substance Use Topics   Alcohol use: Yes    Comment: occ stoppped with pregnancy   Drug use: Yes    Types: Marijuana    Comment: stopped when found out pregnant   No Known Allergies Current Outpatient Medications on File Prior to Visit  Medication Sig Dispense Refill   aspirin EC 81 MG tablet Take 1 tablet (81 mg total) by mouth daily. Swallow whole. 30 tablet 8   Prenatal Vit-Fe Fumarate-FA (PRENATAL MULTIVITAMIN) TABS tablet Take 1 tablet by mouth daily at 12 noon. 30 tablet 9   ferrous sulfate 325 (65 FE) MG tablet Take 1 tablet (325 mg total) by mouth every other day. (Patient not taking: Reported on 12/31/2021) 45 tablet 0   No current  facility-administered medications on file prior to visit.    Review of Systems Pertinent items noted in HPI and remainder of comprehensive ROS otherwise negative. Physical Exam:   Vitals:   12/31/21 1409  BP: 108/68  Pulse: 93  Weight: 236 lb 1.6 oz (107.1 kg)   Fetal Heart Rate (bpm): 140  Constitutional: Well-developed, well-nourished pregnant female in no acute distress.  HEENT: PERRLA Skin: normal color and turgor, no rash Cardiovascular: normal rate & rhythm Respiratory: normal effort GI: Abd soft, non-tender, gravid appropriate for gestational age MS: Extremities nontender, no edema, normal ROM Neurologic: Alert and oriented x 4.  GU: no CVA tenderness Pelvic: exam deferred  Assessment & Plan:  1. 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 When discussing why she might decline this screening, it was discovered that her mother does NOT have cystic fibrosis, but uterine fibroids. This carrier status was removed from her problem list.  2. [redacted] weeks gestation of pregnancy - Routine OB care  -  Pt is interested in waterbirth.  No contraindications at this time per chart review/patient assessment.   - Pt to enroll in class, see CNMs for most visits in the office.  - Discussed waterbirth as option for low-risk pregnancy.  Reviewed conditions that may arise during pregnancy that will risk pt out of waterbirth including hypertension, diabetes, fetal growth restriction <10%ile, etc.  3. Initial obstetric visit in third trimester - Prenatal labs reviewed - Continue prenatal vitamins. - Problem list reviewed and updated and aspirin - Genetic Screening declined due to incorrect AFP with last pregnancy - Ultrasound discussed; fetal anatomic survey: results reviewed. - Anticipatory guidance about prenatal visits  given including labs, ultrasounds, and testing. - Discussed usage of Babyscripts and virtual visits as additional source of managing and completing prenatal visits in midst of coronavirus and pandemic.   - [facility] AMB BABYSCRIPTS SCHEDULE OPTIMIZATION - Encouraged to complete MyChart Registration for her ability to review results, send requests, and have questions addressed.  - The nature of [place] [facility] with multiple MDs and Advanced Practice Providers was explained to patient; also emphasized that residents, students are part of our team. - Routine obstetric precautions reviewed. Encouraged to seek out care at office or emergency room [facility] MAU preferred) for urgent and/or emergent concerns. Return in about 2 weeks (around 01/14/2022) for LOB.    Edd Arbour, MSN, CNM, IBCLC Certified Nurse Midwife, [facility]

## 2022-01-02 ENCOUNTER — Encounter: Payer: Self-pay | Admitting: *Deleted

## 2022-01-02 ENCOUNTER — Ambulatory Visit: Payer: Medicaid Other | Admitting: *Deleted

## 2022-01-02 ENCOUNTER — Ambulatory Visit: Payer: Medicaid Other | Attending: Obstetrics and Gynecology

## 2022-01-02 VITALS — BP 104/63 | HR 80

## 2022-01-02 DIAGNOSIS — Z8349 Family history of other endocrine, nutritional and metabolic diseases: Secondary | ICD-10-CM | POA: Diagnosis present

## 2022-01-02 DIAGNOSIS — O99212 Obesity complicating pregnancy, second trimester: Secondary | ICD-10-CM | POA: Insufficient documentation

## 2022-01-02 DIAGNOSIS — Z6837 Body mass index (BMI) 37.0-37.9, adult: Secondary | ICD-10-CM

## 2022-01-02 DIAGNOSIS — E669 Obesity, unspecified: Secondary | ICD-10-CM | POA: Diagnosis not present

## 2022-01-02 DIAGNOSIS — Z3A32 32 weeks gestation of pregnancy: Secondary | ICD-10-CM

## 2022-01-05 ENCOUNTER — Other Ambulatory Visit: Payer: Self-pay | Admitting: *Deleted

## 2022-01-05 DIAGNOSIS — O99213 Obesity complicating pregnancy, third trimester: Secondary | ICD-10-CM

## 2022-01-05 DIAGNOSIS — Z3689 Encounter for other specified antenatal screening: Secondary | ICD-10-CM

## 2022-01-14 ENCOUNTER — Ambulatory Visit (INDEPENDENT_AMBULATORY_CARE_PROVIDER_SITE_OTHER): Payer: Medicaid Other | Admitting: Certified Nurse Midwife

## 2022-01-14 ENCOUNTER — Other Ambulatory Visit: Payer: Self-pay

## 2022-01-14 VITALS — BP 109/61 | HR 91 | Wt 237.0 lb

## 2022-01-14 DIAGNOSIS — Z3A34 34 weeks gestation of pregnancy: Secondary | ICD-10-CM

## 2022-01-14 DIAGNOSIS — Z3493 Encounter for supervision of normal pregnancy, unspecified, third trimester: Secondary | ICD-10-CM

## 2022-01-14 NOTE — Progress Notes (Signed)
   PRENATAL VISIT NOTE  Subjective:  Deborah Mahoney is a 31 y.o. G2P1001 at [redacted]w[redacted]d being seen today for ongoing prenatal care.  She is currently monitored for the following issues for this low-risk pregnancy and has Seasonal allergies; Migraine without aura and with status migrainosus, not intractable; and Supervision of low-risk pregnancy on their problem list.  Patient reports no complaints.  Contractions: Not present. Vag. Bleeding: None.  Movement: Present. Denies leaking of fluid.   The following portions of the patient's history were reviewed and updated as appropriate: allergies, current medications, past family history, past medical history, past social history, past surgical history and problem list.   Objective:   Vitals:   01/14/22 1012  BP: 109/61  Pulse: 91  Weight: 237 lb (107.5 kg)    Fetal Status: Fetal Heart Rate (bpm): 142 Fundal Height: 36 cm Movement: Present     General:  Alert, oriented and cooperative. Patient is in no acute distress.  Skin: Skin is warm and dry. No rash noted.   Cardiovascular: Normal heart rate noted  Respiratory: Normal respiratory effort, no problems with respiration noted  Abdomen: Soft, gravid, appropriate for gestational age.  Pain/Pressure: Absent     Pelvic: Cervical exam deferred        Extremities: Normal range of motion.     Mental Status: Normal mood and affect. Normal behavior. Normal judgment and thought content.   Assessment and Plan:  Pregnancy: G2P1001 at [redacted]w[redacted]d 1. Encounter for supervision of low-risk pregnancy in third trimester - Doing well, feeling regular and vigorous fetal movement   2. [redacted] weeks gestation of pregnancy - Routine OB care including GBS testing at next visit  Preterm labor symptoms and general obstetric precautions including but not limited to vaginal bleeding, contractions, leaking of fluid and fetal movement were reviewed in detail with the patient. Please refer to After Visit Summary for other  counseling recommendations.   Return in about 2 weeks (around 01/28/2022) for IN-PERSON, LOB/GBS.  Future Appointments  Date Time Provider Department Center  01/15/2022 10:30 AM [facility] CLINIC [facility]  01/28/2022  8:15 AM Milas Hock, MD [facility]  02/06/2022 10:30 AM [facility] NURSE [facility]  02/06/2022 10:45 AM [facility] US5 [facility]    Bernerd Limbo, CNM

## 2022-01-24 NOTE — Progress Notes (Unsigned)
   PRENATAL VISIT NOTE  Subjective:  Deborah Mahoney is a 31 y.o. G2P1001 at 36w***d being seen today for ongoing prenatal care.  She is currently monitored for the following issues for this low-risk pregnancy and has Seasonal allergies; Migraine without aura and with status migrainosus, not intractable; and Supervision of low-risk pregnancy on their problem list.  Patient reports {sx:14538}.   .  .   . Denies leaking of fluid.   The following portions of the patient's history were reviewed and updated as appropriate: allergies, current medications, past family history, past medical history, past social history, past surgical history and problem list.   Objective:  There were no vitals filed for this visit.  Fetal Status:           General:  Alert, oriented and cooperative. Patient is in no acute distress.  Skin: Skin is warm and dry. No rash noted.   Cardiovascular: Normal heart rate noted  Respiratory: Normal respiratory effort, no problems with respiration noted  Abdomen: Soft, gravid, appropriate for gestational age.        Pelvic: Cervical exam performed in the presence of a chaperone        Extremities: Normal range of motion.     Mental Status: Normal mood and affect. Normal behavior. Normal judgment and thought content.   Assessment and Plan:  Pregnancy: G2P1001 at 36w***d 1. Encounter for supervision of low-risk pregnancy in third trimester - Cultures done - Has growth scan already scheduled 7/28 due to history of baby being 8lb2oz  Preterm labor symptoms and general obstetric precautions including but not limited to vaginal bleeding, contractions, leaking of fluid and fetal movement were reviewed in detail with the patient. Please refer to After Visit Summary for other counseling recommendations.   No follow-ups on file.  Future Appointments  Date Time Provider Department Center  01/28/2022  8:15 AM Milas Hock, MD [facility]  02/06/2022 10:30 AM [facility] NURSE [facility]  02/06/2022 10:45 AM [facility] US5 [facility]    Milas Hock, MD

## 2022-01-28 ENCOUNTER — Encounter: Payer: Self-pay | Admitting: Obstetrics and Gynecology

## 2022-01-28 ENCOUNTER — Ambulatory Visit (INDEPENDENT_AMBULATORY_CARE_PROVIDER_SITE_OTHER): Payer: Medicaid Other | Admitting: Obstetrics and Gynecology

## 2022-01-28 ENCOUNTER — Other Ambulatory Visit (HOSPITAL_COMMUNITY)
Admission: RE | Admit: 2022-01-28 | Discharge: 2022-01-28 | Disposition: A | Discharge status: Home / Self Care | Payer: Medicaid Other | Source: Ambulatory Visit | Attending: Obstetrics and Gynecology | Admitting: Obstetrics and Gynecology

## 2022-01-28 VITALS — BP 111/66 | Wt 234.0 lb

## 2022-01-28 DIAGNOSIS — Z3493 Encounter for supervision of normal pregnancy, unspecified, third trimester: Secondary | ICD-10-CM

## 2022-01-28 DIAGNOSIS — Z23 Encounter for immunization: Secondary | ICD-10-CM

## 2022-01-28 DIAGNOSIS — Z3A36 36 weeks gestation of pregnancy: Secondary | ICD-10-CM

## 2022-01-28 NOTE — Progress Notes (Addendum)
   PRENATAL VISIT NOTE  Subjective:  Deborah Mahoney is a 31 y.o. G2P1001 at [redacted]w[redacted]d being seen today for ongoing prenatal care.  She is currently monitored for the following issues for this low-risk pregnancy and has Seasonal allergies; Migraine without aura and with status migrainosus, not intractable; and Supervision of low-risk pregnancy on their problem list.  Patient reports no complaints.  Contractions: Irritability. Vag. Bleeding: None.  Movement: Present. Denies leaking of fluid.   The following portions of the patient's history were reviewed and updated as appropriate: allergies, current medications, past family history, past medical history, past social history, past surgical history and problem list.   Objective:   Vitals:   01/28/22 0841  BP: 111/66  Weight: 234 lb (106.1 kg)    Fetal Status: Fetal Heart Rate (bpm): 153 Fundal Height: 37 cm Movement: Present     General:  Alert, oriented and cooperative. Patient is in no acute distress.  Skin: Skin is warm and dry. No rash noted.   Cardiovascular: Normal heart rate noted  Respiratory: Normal respiratory effort, no problems with respiration noted  Abdomen: Soft, gravid, appropriate for gestational age.  Pain/Pressure: Present     Pelvic: Cervical exam deferred        Extremities: Normal range of motion.  Edema: Trace  Mental Status: Normal mood and affect. Normal behavior. Normal judgment and thought content.   Assessment and Plan:  Pregnancy: G2P1001 at [redacted]w[redacted]d 1. Encounter for supervision of low-risk pregnancy in third trimester Doing wel, wit no concerns today. GBS swab collected, along with GC/Chlamydia today. Tdap given today.  2. [redacted] weeks gestation of pregnancy No new  concerns. Growth scan scheduled due to EFW > 8 lbs  Preterm labor symptoms and general obstetric precautions including but not limited to vaginal bleeding, contractions, leaking of fluid and fetal movement were reviewed in detail with the  patient. Please refer to After Visit Summary for other counseling recommendations.   Return in about 1 week (around 02/04/2022) for OB VISIT, MD or APP.  Future Appointments  Date Time Provider Department Center  02/04/2022  1:15 PM Milas Hock, MD [facility]  02/06/2022 10:30 AM [facility] NURSE [facility]  02/06/2022 10:45 AM [facility] US5 [facility]  02/13/2022  8:35 AM Mayo Bing, MD [facility]  02/20/2022  9:15 AM Macon Large Jethro Bastos, MD [facility]  02/26/2022 10:35 AM Hermina Staggers, MD [facility]  02/26/2022 11:15 AM [facility] NST [facility]    [place] Benita Gutter, MD

## 2022-01-28 NOTE — Patient Instructions (Signed)
[facility] Address: [address], [place], [place] [address] Phone: [phone] 

## 2022-01-29 LAB — GC/CHLAMYDIA PROBE AMP (~~LOC~~) NOT AT ARMC
Chlamydia: NEGATIVE
Comment: NEGATIVE
Comment: NORMAL
Neisseria Gonorrhea: NEGATIVE

## 2022-02-01 LAB — CULTURE, BETA STREP (GROUP B ONLY): Strep Gp B Culture: NEGATIVE

## 2022-02-02 NOTE — Progress Notes (Unsigned)
   PRENATAL VISIT NOTE  Subjective:  Deborah Mahoney is a 31 y.o. G2P1001 at [redacted]w[redacted]d being seen today for ongoing prenatal care.  She is currently monitored for the following issues for this low-risk pregnancy and has Seasonal allergies; Migraine without aura and with status migrainosus, not intractable; and Supervision of low-risk pregnancy on their problem list.  Patient reports {sx:14538}.   .  .   . Denies leaking of fluid.   The following portions of the patient's history were reviewed and updated as appropriate: allergies, current medications, past family history, past medical history, past social history, past surgical history and problem list.   Objective:  There were no vitals filed for this visit.  Fetal Status:           General:  Alert, oriented and cooperative. Patient is in no acute distress.  Skin: Skin is warm and dry. No rash noted.   Cardiovascular: Normal heart rate noted  Respiratory: Normal respiratory effort, no problems with respiration noted  Abdomen: Soft, gravid, appropriate for gestational age.        Pelvic: {Blank single:19197::"Cervical exam performed in the presence of a chaperone","Cervical exam deferred"}        Extremities: Normal range of motion.     Mental Status: Normal mood and affect. Normal behavior. Normal judgment and thought content.   Assessment and Plan:  Pregnancy: G2P1001 at [redacted]w[redacted]d 1. Encounter for supervision of low-risk pregnancy in third trimester GBS neg   Term labor symptoms and general obstetric precautions including but not limited to vaginal bleeding, contractions, leaking of fluid and fetal movement were reviewed in detail with the patient. Please refer to After Visit Summary for other counseling recommendations.   No follow-ups on file.  Future Appointments  Date Time Provider Department Center  02/04/2022  1:15 PM Milas Hock, MD [facility]  02/06/2022 10:30 AM [facility] NURSE [facility]  02/06/2022 10:45 AM [facility] US5  [facility]  02/13/2022  8:35 AM Carmen Bing, MD [facility]  02/20/2022  9:15 AM Macon Large Jethro Bastos, MD [facility]  02/26/2022  8:55 AM Hermina Staggers, MD [facility]  02/26/2022 11:15 AM [facility] NST [facility]    Milas Hock, MD

## 2022-02-03 ENCOUNTER — Telehealth: Payer: Self-pay | Admitting: General Practice

## 2022-02-03 NOTE — Telephone Encounter (Signed)
Patient called into front office stating she noticed watery consistency in her underwear around 1130 but hasn't noticed anything since then. She reports some contractions but doesn't know how far apart they are. She states they do last around 2 minutes. Discussed with Dr Crissie Reese who states patient can continue to monitor at home unless something changes. Discussed with patient if she continues to leak watery discharge, contractions occur every 3-4 minutes for longer than an hour or if she isn't feeling the baby move to go to MAU for evaluation. Patient verbalized understanding.

## 2022-02-04 ENCOUNTER — Encounter: Payer: Self-pay | Admitting: Obstetrics and Gynecology

## 2022-02-04 ENCOUNTER — Other Ambulatory Visit: Payer: Self-pay

## 2022-02-04 ENCOUNTER — Ambulatory Visit (INDEPENDENT_AMBULATORY_CARE_PROVIDER_SITE_OTHER): Payer: Medicaid Other | Admitting: Obstetrics and Gynecology

## 2022-02-04 VITALS — BP 112/69 | HR 103 | Wt 233.6 lb

## 2022-02-04 DIAGNOSIS — Z3493 Encounter for supervision of normal pregnancy, unspecified, third trimester: Secondary | ICD-10-CM

## 2022-02-06 ENCOUNTER — Ambulatory Visit: Payer: Medicaid Other | Admitting: *Deleted

## 2022-02-06 ENCOUNTER — Ambulatory Visit: Payer: Medicaid Other | Attending: Obstetrics and Gynecology

## 2022-02-06 ENCOUNTER — Encounter: Payer: Self-pay | Admitting: *Deleted

## 2022-02-06 VITALS — BP 117/67 | HR 90

## 2022-02-06 DIAGNOSIS — O358XX0 Maternal care for other (suspected) fetal abnormality and damage, not applicable or unspecified: Secondary | ICD-10-CM | POA: Diagnosis not present

## 2022-02-06 DIAGNOSIS — E669 Obesity, unspecified: Secondary | ICD-10-CM | POA: Diagnosis not present

## 2022-02-06 DIAGNOSIS — Z6833 Body mass index (BMI) 33.0-33.9, adult: Secondary | ICD-10-CM | POA: Diagnosis present

## 2022-02-06 DIAGNOSIS — O285 Abnormal chromosomal and genetic finding on antenatal screening of mother: Secondary | ICD-10-CM | POA: Diagnosis not present

## 2022-02-06 DIAGNOSIS — Z3A37 37 weeks gestation of pregnancy: Secondary | ICD-10-CM | POA: Diagnosis not present

## 2022-02-06 DIAGNOSIS — O99213 Obesity complicating pregnancy, third trimester: Secondary | ICD-10-CM | POA: Diagnosis not present

## 2022-02-06 DIAGNOSIS — Z141 Cystic fibrosis carrier: Secondary | ICD-10-CM

## 2022-02-06 DIAGNOSIS — Z3689 Encounter for other specified antenatal screening: Secondary | ICD-10-CM | POA: Diagnosis present

## 2022-02-13 ENCOUNTER — Other Ambulatory Visit: Payer: Self-pay

## 2022-02-13 ENCOUNTER — Ambulatory Visit (INDEPENDENT_AMBULATORY_CARE_PROVIDER_SITE_OTHER): Payer: Medicaid Other | Admitting: Advanced Practice Midwife

## 2022-02-13 ENCOUNTER — Encounter: Payer: Medicaid Other | Admitting: Obstetrics and Gynecology

## 2022-02-13 VITALS — BP 113/72 | HR 91 | Wt 236.0 lb

## 2022-02-13 DIAGNOSIS — Z3A38 38 weeks gestation of pregnancy: Secondary | ICD-10-CM

## 2022-02-13 DIAGNOSIS — O99013 Anemia complicating pregnancy, third trimester: Secondary | ICD-10-CM

## 2022-02-13 DIAGNOSIS — Z3493 Encounter for supervision of normal pregnancy, unspecified, third trimester: Secondary | ICD-10-CM

## 2022-02-13 LAB — CBC
Hematocrit: 25.9 % — ABNORMAL LOW (ref 34.0–46.6)
Hemoglobin: 8.4 g/dL — ABNORMAL LOW (ref 11.1–15.9)
MCH: 26.1 pg — ABNORMAL LOW (ref 26.6–33.0)
MCHC: 32.4 g/dL (ref 31.5–35.7)
MCV: 80 fL (ref 79–97)
Platelets: 214 10*3/uL (ref 150–450)
RBC: 3.22 x10E6/uL — ABNORMAL LOW (ref 3.77–5.28)
RDW: 13.8 % (ref 11.7–15.4)
WBC: 7.9 10*3/uL (ref 3.4–10.8)

## 2022-02-13 NOTE — Progress Notes (Signed)
   PRENATAL VISIT NOTE  Subjective:  Deborah Mahoney is a 31 y.o. G2P1001 at [redacted]w[redacted]d being seen today for ongoing prenatal care.  She is currently monitored for the following issues for this low-risk pregnancy and has Anemia in pregnancy; Seasonal allergies; Migraine without aura and with status migrainosus, not intractable; and Supervision of low-risk pregnancy on their problem list.  Patient reports occasional contractions.  Contractions: Irritability. Vag. Bleeding: None.  Movement: Present. Denies leaking of fluid.   The following portions of the patient's history were reviewed and updated as appropriate: allergies, current medications, past family history, past medical history, past social history, past surgical history and problem list.   Objective:   Vitals:   02/13/22 0815  BP: 113/72  Pulse: 91  Weight: 236 lb (107 kg)    Fetal Status: Fetal Heart Rate (bpm): 145 Fundal Height: 39 cm Movement: Present  Presentation: Vertex by [place]  General:  Alert, oriented and cooperative. Patient is in no acute distress.  Skin: Skin is warm and dry. No rash noted.   Cardiovascular: Normal heart rate noted  Respiratory: Normal respiratory effort, no problems with respiration noted  Abdomen: Soft, gravid, appropriate for gestational age.  Pain/Pressure: Present     Pelvic: Cervical exam performed in the presence of a chaperone Dilation: 2 Effacement (%): 50 Station: Ballotable  Extremities: Normal range of motion.  Edema: Trace  Mental Status: Normal mood and affect. Normal behavior. Normal judgment and thought content.   Assessment and Plan:  Pregnancy: G2P1001 at [redacted]w[redacted]d 1. Encounter for supervision of low-risk pregnancy in third trimester - Routine care - No longer plans waterbirth. Talked about other options  2. [redacted] weeks gestation of pregnancy   3. Anemia during pregnancy in third trimester - Offered, declined PO Iron. Recommend Floradix and increasing dietary iron. Will check CBC today.  Discussed possible need for IV iron antepartum or postpartum if it has dropped lower.   Preterm labor symptoms and general obstetric precautions including but not limited to vaginal bleeding, contractions, leaking of fluid and fetal movement were reviewed in detail with the patient. Please refer to After Visit Summary for other counseling recommendations.   No follow-ups on file.  Future Appointments  Date Time Provider Department Center  02/20/2022  9:15 AM Tereso Newcomer, MD [facility]  02/26/2022  9:15 AM Berle Mull [facility]  02/26/2022 11:15 AM [facility] NST [facility]    Dorathy Kinsman, CNM

## 2022-02-13 NOTE — Patient Instructions (Signed)
Floradix Iron supplement.

## 2022-02-15 ENCOUNTER — Inpatient Hospital Stay (HOSPITAL_COMMUNITY)
Admission: AD | Admit: 2022-02-15 | Discharge: 2022-02-15 | Disposition: A | Discharge status: Home / Self Care | Payer: Medicaid Other | Attending: Obstetrics and Gynecology | Admitting: Obstetrics and Gynecology

## 2022-02-15 ENCOUNTER — Encounter (HOSPITAL_COMMUNITY): Payer: Self-pay | Admitting: Obstetrics and Gynecology

## 2022-02-15 ENCOUNTER — Inpatient Hospital Stay (HOSPITAL_BASED_OUTPATIENT_CLINIC_OR_DEPARTMENT_OTHER): Payer: Medicaid Other

## 2022-02-15 DIAGNOSIS — Z3689 Encounter for other specified antenatal screening: Secondary | ICD-10-CM

## 2022-02-15 DIAGNOSIS — O368130 Decreased fetal movements, third trimester, not applicable or unspecified: Secondary | ICD-10-CM | POA: Insufficient documentation

## 2022-02-15 DIAGNOSIS — Z141 Cystic fibrosis carrier: Secondary | ICD-10-CM | POA: Diagnosis not present

## 2022-02-15 DIAGNOSIS — Z3A39 39 weeks gestation of pregnancy: Secondary | ICD-10-CM

## 2022-02-15 DIAGNOSIS — O285 Abnormal chromosomal and genetic finding on antenatal screening of mother: Secondary | ICD-10-CM

## 2022-02-15 DIAGNOSIS — O99213 Obesity complicating pregnancy, third trimester: Secondary | ICD-10-CM | POA: Diagnosis not present

## 2022-02-15 DIAGNOSIS — Z3493 Encounter for supervision of normal pregnancy, unspecified, third trimester: Secondary | ICD-10-CM

## 2022-02-15 DIAGNOSIS — E669 Obesity, unspecified: Secondary | ICD-10-CM

## 2022-02-15 NOTE — MAU Note (Signed)
...  Deborah Mahoney is a 31 y.o. at [redacted]w[redacted]d here in MAU reporting: DFM since 0650 this morning when she woke up. She reports her baby is usually active and she hadn't felt anything so she ate a cookie and "a sip or two of pepsi" around 0745. Denies VB or LOF. Denies pain. Endorses occasional braxton hicks contractions as well as vaginal pressure with standing.  Onset of complaint: Today at 0650 Pain score: Denies pain.  FHT: 133 initial external

## 2022-02-15 NOTE — MAU Provider Note (Signed)
Chief Complaint:  Decreased Fetal Movement   Event Date/Time   First Provider Initiated Contact with Patient 02/15/22 [phone]     HPI: Deborah Mahoney is a 31 y.o. G2P1001 at [redacted]w[redacted]d who presents to maternity admissions reporting decreased fetal movement since 0650 this morning when she got up for the day. Reports that her baby is always really active in the morning and has felt nothing since she got up despite eating two cookies and having a sip of Pepsi to try and get her moving. Having occasional Braxton-Hicks contractions and some increased vaginal pressure but no other symptoms.  Specifically denies vaginal bleeding, discharge, fever, falls or recent illness.  Pregnancy Course: Receives care at [facility], largely uncomplicated pregnancy other than anemia (last Hgb 8.4, on oral iron)  Past Medical History:  Diagnosis Date   Anemia    Generalized anxiety disorder 01/23/2016   GAD7- 14 PHQ9- 4   Infection    UTI   Migraine without status migrainosus, not intractable 02/17/2017   Seasonal allergies    OB History  Gravida Para Term Preterm AB Living  2 1 1     1   SAB IAB Ectopic Multiple Live Births          1    # Outcome Date GA Lbr Len/2nd Weight Sex Delivery Anes PTL Lv  2 Current           1 Term 11/04/13 [redacted]w[redacted]d 35:02 / 03:18 8 lb 2 oz (3.685 kg) F Vag-Spont EPI  LIV   Past Surgical History:  Procedure Laterality Date   NO PAST SURGERIES     Family History  Problem Relation Age of Onset   Hypertension Mother    Hyperlipidemia Father    Hypertension Father    Hearing loss Neg Hx    Social History   Tobacco Use   Smoking status: Former   Smokeless tobacco: Former  [redacted]w[redacted]d Use: Never used  Substance Use Topics   Alcohol use: Not Currently    Comment: occ stoppped with pregnancy   Drug use: Not Currently    Types: Marijuana    Comment: stopped when found out pregnant   No Known Allergies Medications Prior to Admission  Medication Sig Dispense Refill Last Dose    aspirin EC 81 MG tablet Take 1 tablet (81 mg total) by mouth daily. Swallow whole. 30 tablet 8 02/14/2022   Prenatal Vit-Fe Fumarate-FA (PRENATAL MULTIVITAMIN) TABS tablet Take 1 tablet by mouth daily at 12 noon. 30 tablet 9 02/14/2022   I have reviewed patient's Past Medical Hx, Surgical Hx, Family Hx, Social Hx, medications and allergies.   ROS:  Pertinent items noted in HPI and remainder of comprehensive ROS otherwise negative.   Physical Exam  Patient Vitals for the past 24 hrs:  BP Temp Temp src Pulse Resp SpO2 Height Weight  02/15/22 0848 122/65 -- -- 82 -- -- -- --  02/15/22 0822 118/68 98.8 F (37.1 C) Oral 78 15 99 % 5\' 5"  (1.651 m) 236 lb 8 oz (107.3 kg)   Constitutional: Well-developed, well-nourished female in no acute distress.  Cardiovascular: normal rate & rhythm, warm and well-perfused Respiratory: normal effort, no problems with respiration noted GI: Abd soft, non-tender, gravid appropriate for gestational age MS: Extremities nontender, no edema, normal ROM Neurologic: Alert and oriented x 4.  GU: no CVA tenderness Pelvic: exam deferred, no ctx or discharge, uterus relaxed on NST  Fetal Tracing: reactive Baseline: 140 Variability: moderate Accelerations: 15x15 Decelerations: none  Toco: relaxed, some UI   Labs: No results found for this or any previous visit (from the past 24 hour(s)).  Imaging:    Media Information   Document Information  Ultrasound    02/15/2022 00:00  Attached To:  Hospital Encounter on 02/15/22   Source Information  Default, Provider, MD    MAU Course: Orders Placed This Encounter  Procedures   [place] MFM FETAL BPP WO NON STRESS   Discharge patient   No orders of the defined types were placed in this encounter.  MDM: Baby immediately active with palpable movement once on monitors, pt much relieved. Prefers to wait on spontaneous labor, BPP ordered, 8/8.  Assessment: 1. Decreased fetal movements in third trimester, single or  unspecified fetus   2. Fetal movement present during pregnancy in third trimester   3. NST (non-stress test) reactive   4. [redacted] weeks gestation of pregnancy    Plan: Discharge home in stable conditio with term labor precautions.  See AVS for additional discharge instructions   Follow-up Information     [facility].   Specialty: Obstetrics and Gynecology Why: as scheduled for ongoing prenatal care Contact information: [address] [place] [address] [phone]                Allergies as of 02/15/2022   No Known Allergies      Medication List     TAKE these medications    aspirin EC 81 MG tablet Take 1 tablet (81 mg total) by mouth daily. Swallow whole.   prenatal multivitamin Tabs tablet Take 1 tablet by mouth daily at 12 noon.       Edd Arbour, CNM, MSN, IBCLC Certified Nurse Midwife, [facility]

## 2022-02-16 ENCOUNTER — Other Ambulatory Visit: Payer: Self-pay | Admitting: Advanced Practice Midwife

## 2022-02-16 NOTE — Progress Notes (Signed)
Your Hemoglobin level, the test that we use to screen for anemia has dropped to 8.4. We would like for it to be above 10 in pregnancy. I would recommend an iron infusion to decrease the chance of you needing a blood transfusion after giving birth. We usually use a medicine call Venofer which has been used extensively in pregnant women and has a low risk of allergic reaction. If you are interested, I can try to get it scheduled at the infusion center as soon as possible. It takes about 1 week to take full effect but even if you deliver before then, it will be helpful. We may also consider a second dose after delivery. Respond to this message or call the office if you are interested or have questions.

## 2022-02-17 ENCOUNTER — Telehealth: Payer: Self-pay | Admitting: Pharmacy Technician

## 2022-02-17 NOTE — Telephone Encounter (Signed)
FYI NOTE:  Auth Submission: NO AUTH NEEDED Payer: [place] [employer] Medication & CPT/J Code(s) submitted: Venofer (Iron Sucrose) J1756 Route of submission (phone, fax, portal): PORTAL Auth type: Buy/Bill Units/visits requested: 500MG  X 2 DOSE Reference number:  Approval from: 02/17/22 to 07/12/22   Patient will be scheduled as soon as possible. 07/14/22

## 2022-02-20 ENCOUNTER — Ambulatory Visit (INDEPENDENT_AMBULATORY_CARE_PROVIDER_SITE_OTHER): Payer: Medicaid Other | Admitting: Obstetrics & Gynecology

## 2022-02-20 ENCOUNTER — Ambulatory Visit (INDEPENDENT_AMBULATORY_CARE_PROVIDER_SITE_OTHER): Payer: Medicaid Other

## 2022-02-20 VITALS — BP 116/70 | HR 85 | Wt 236.2 lb

## 2022-02-20 VITALS — BP 110/51 | HR 82 | Temp 97.6°F | Resp 20 | Ht 64.0 in | Wt 235.6 lb

## 2022-02-20 DIAGNOSIS — O99013 Anemia complicating pregnancy, third trimester: Secondary | ICD-10-CM

## 2022-02-20 DIAGNOSIS — Z3493 Encounter for supervision of normal pregnancy, unspecified, third trimester: Secondary | ICD-10-CM | POA: Diagnosis not present

## 2022-02-20 DIAGNOSIS — Z3A39 39 weeks gestation of pregnancy: Secondary | ICD-10-CM

## 2022-02-20 MED ORDER — ACETAMINOPHEN 325 MG PO TABS
650.0000 mg | ORAL_TABLET | Freq: Once | ORAL | Status: AC
  Administered 2022-02-20: 650 mg via ORAL
  Filled 2022-02-20: qty 2

## 2022-02-20 MED ORDER — SODIUM CHLORIDE 0.9 % IV SOLN
500.0000 mg | Freq: Once | INTRAVENOUS | Status: AC
  Administered 2022-02-20: 500 mg via INTRAVENOUS
  Filled 2022-02-20: qty 25

## 2022-02-20 MED ORDER — DIPHENHYDRAMINE HCL 25 MG PO CAPS
25.0000 mg | ORAL_CAPSULE | Freq: Once | ORAL | Status: AC
  Administered 2022-02-20: 25 mg via ORAL
  Filled 2022-02-20: qty 1

## 2022-02-20 NOTE — Progress Notes (Signed)
Diagnosis: Iron Deficiency Anemia  Provider:  Chilton Greathouse MD  Procedure: Infusion  IV Type: Peripheral, IV Location: R Forearm  Venofer (Iron Sucrose), Dose: 500 mg  Infusion Start Time: 1035  Infusion Stop Time: 1435  Post Infusion IV Care: Observation period completed and Peripheral IV Discontinued  3:15pm-Patient complained of discoloration in feet and a funny feeling when standing to leave. We had patient sit down and recline with feet up for 15 more minute observation. Discoloration improved and patient states she feels fine. She was discharged at 3:33pm.    Discharge: Condition: Good, Destination: Home . AVS provided to patient.   Performed by:  Loney Hering, LPN

## 2022-02-20 NOTE — Patient Instructions (Addendum)
Return to office for any scheduled appointments. Call the office or go to the MAU at [facility] if: You begin to have strong, frequent contractions Your water breaks.  Sometimes it is a big gush of fluid, sometimes it is just a trickle that keeps getting your underwear wet or running down your legs You have vaginal bleeding.  It is normal to have a small amount of spotting if your cervix was checked.  You do not feel your baby moving like normal.  If you do not, get something to eat and drink and lay down and focus on feeling your baby move.   If your baby is still not moving like normal, you should call the office or go to MAU. Any other obstetric concerns.   Induction scheduled on 02/28/22 at midnight.  Come to [facility] at 11:45 pm on 02/27/22.  Cervical Ripening (to get your cervix ready for labor) : May try one or all:  Red Raspberry Leaf capsules:  two 300mg  or 400mg  tablets with each meal, 2-3 times a day  Potential Side Effects Of Raspberry Leaf:  Most women do not experience any side effects from drinking raspberry leaf tea. However, nausea and loose stools are possible   Evening Primrose Oil capsules: may take 1 to 3 capsules daily. May also prick one to release the oil and insert it into your vagina at night.  Some of the potential side effects:  Upset stomach  Loose stools or diarrhea  Headaches  Nausea  6 Dates a day (may taste better if warmed in microwave until soft). Found where raisins are in the grocery store

## 2022-02-20 NOTE — Progress Notes (Signed)
   PRENATAL VISIT NOTE  Subjective:  Deborah Mahoney is a 31 y.o. G2P1001 at [redacted]w[redacted]d being seen today for ongoing prenatal care.  She is currently monitored for the following issues for this low-risk pregnancy and has Anemia in pregnancy; Seasonal allergies; Migraine without aura and with status migrainosus, not intractable; and Supervision of low-risk pregnancy on their problem list.  Patient reports occasional contractions.  Contractions: Irritability. Vag. Bleeding: None.  Movement: Present. Denies leaking of fluid.   The following portions of the patient's history were reviewed and updated as appropriate: allergies, current medications, past family history, past medical history, past social history, past surgical history and problem list.   Objective:   Vitals:   02/20/22 0820  BP: 116/70  Pulse: 85  Weight: 236 lb 3.5 oz (107.1 kg)    Fetal Status: Fetal Heart Rate (bpm): 145   Movement: Present  Presentation: Vertex  General:  Alert, oriented and cooperative. Patient is in no acute distress.  Skin: Skin is warm and dry. No rash noted.   Cardiovascular: Normal heart rate noted  Respiratory: Normal respiratory effort, no problems with respiration noted  Abdomen: Soft, gravid, appropriate for gestational age.  Pain/Pressure: Present     Pelvic: Cervical exam performed in the presence of a chaperone Dilation: 3.5 Effacement (%): 70 Station: Ballotable  Extremities: Normal range of motion.  Edema: Trace  Mental Status: Normal mood and affect. Normal behavior. Normal judgment and thought content.   Assessment and Plan:  Pregnancy: G2P1001 at [redacted]w[redacted]d 1. [redacted] weeks gestation of pregnancy 2. Encounter for supervision of low-risk pregnancy in third trimester Discussed IOL at 41 weeks. Patient already scheduled for post dates BPP next week. Induction of labor scheduled on 02/28/2022 at midnight, orders have been signed and held. She was told to expect a call from [facility] L&D with further instructions  about the induction of labor.  Labor symptoms and general obstetric precautions including but not limited to vaginal bleeding, contractions, leaking of fluid and fetal movement were reviewed in detail with the patient. Please refer to After Visit Summary for other counseling recommendations.   Return in about 6 days (around 02/26/2022) for NST, BPP, OFFICE OB VISIT (already scheduled).  Future Appointments  Date Time Provider Department Center  02/20/2022  9:15 AM [facility] 1 [facility] None  02/26/2022  9:15 AM Brand Males, CNM [facility]  02/26/2022 11:15 AM [facility] NST [facility]    Jaynie Collins, MD

## 2022-02-21 ENCOUNTER — Inpatient Hospital Stay (HOSPITAL_COMMUNITY)
Admission: AD | Admit: 2022-02-21 | Discharge: 2022-02-23 | DRG: 807 | Disposition: A | Discharge status: Home / Self Care | Payer: Medicaid Other | Attending: Obstetrics and Gynecology | Admitting: Obstetrics and Gynecology

## 2022-02-21 ENCOUNTER — Other Ambulatory Visit: Payer: Self-pay

## 2022-02-21 ENCOUNTER — Inpatient Hospital Stay (HOSPITAL_COMMUNITY): Payer: Medicaid Other | Admitting: Anesthesiology

## 2022-02-21 ENCOUNTER — Encounter (HOSPITAL_COMMUNITY): Payer: Self-pay | Admitting: Obstetrics and Gynecology

## 2022-02-21 DIAGNOSIS — D649 Anemia, unspecified: Secondary | ICD-10-CM | POA: Diagnosis not present

## 2022-02-21 DIAGNOSIS — F419 Anxiety disorder, unspecified: Secondary | ICD-10-CM | POA: Diagnosis not present

## 2022-02-21 DIAGNOSIS — O480 Post-term pregnancy: Secondary | ICD-10-CM | POA: Diagnosis not present

## 2022-02-21 DIAGNOSIS — O26893 Other specified pregnancy related conditions, third trimester: Secondary | ICD-10-CM | POA: Diagnosis not present

## 2022-02-21 DIAGNOSIS — O9902 Anemia complicating childbirth: Secondary | ICD-10-CM | POA: Diagnosis not present

## 2022-02-21 DIAGNOSIS — O99013 Anemia complicating pregnancy, third trimester: Secondary | ICD-10-CM

## 2022-02-21 DIAGNOSIS — Z87891 Personal history of nicotine dependence: Secondary | ICD-10-CM

## 2022-02-21 DIAGNOSIS — O09891 Supervision of other high risk pregnancies, first trimester: Secondary | ICD-10-CM | POA: Diagnosis present

## 2022-02-21 DIAGNOSIS — Z7982 Long term (current) use of aspirin: Secondary | ICD-10-CM

## 2022-02-21 DIAGNOSIS — O99214 Obesity complicating childbirth: Secondary | ICD-10-CM | POA: Diagnosis present

## 2022-02-21 DIAGNOSIS — O99012 Anemia complicating pregnancy, second trimester: Secondary | ICD-10-CM | POA: Diagnosis present

## 2022-02-21 DIAGNOSIS — Z3A40 40 weeks gestation of pregnancy: Secondary | ICD-10-CM | POA: Diagnosis not present

## 2022-02-21 DIAGNOSIS — O99019 Anemia complicating pregnancy, unspecified trimester: Secondary | ICD-10-CM | POA: Diagnosis present

## 2022-02-21 DIAGNOSIS — O99344 Other mental disorders complicating childbirth: Secondary | ICD-10-CM | POA: Diagnosis not present

## 2022-02-21 DIAGNOSIS — Z3493 Encounter for supervision of normal pregnancy, unspecified, third trimester: Secondary | ICD-10-CM

## 2022-02-21 LAB — CBC
HCT: 27.9 % — ABNORMAL LOW (ref 36.0–46.0)
Hemoglobin: 8.9 g/dL — ABNORMAL LOW (ref 12.0–15.0)
MCH: 26.3 pg (ref 26.0–34.0)
MCHC: 31.9 g/dL (ref 30.0–36.0)
MCV: 82.3 fL (ref 80.0–100.0)
Platelets: 247 10*3/uL (ref 150–400)
RBC: 3.39 MIL/uL — ABNORMAL LOW (ref 3.87–5.11)
RDW: 14.3 % (ref 11.5–15.5)
WBC: 8.6 10*3/uL (ref 4.0–10.5)
nRBC: 0.2 % (ref 0.0–0.2)

## 2022-02-21 LAB — TYPE AND SCREEN
ABO/RH(D): O POS
Antibody Screen: NEGATIVE

## 2022-02-21 LAB — ABO/RH: ABO/RH(D): O POS

## 2022-02-21 MED ORDER — FENTANYL CITRATE (PF) 100 MCG/2ML IJ SOLN
50.0000 ug | INTRAMUSCULAR | Status: DC | PRN
  Filled 2022-02-21: qty 2

## 2022-02-21 MED ORDER — FLEET ENEMA 7-19 GM/118ML RE ENEM: 1.0000 | ENEMA | Freq: Every day | RECTAL | Status: DC | PRN

## 2022-02-21 MED ORDER — OXYTOCIN BOLUS FROM INFUSION
333.0000 mL | Freq: Once | INTRAVENOUS | Status: AC
  Administered 2022-02-22: 333 mL via INTRAVENOUS

## 2022-02-21 MED ORDER — LACTATED RINGERS IV SOLN: 500.0000 mL | INTRAVENOUS | Status: DC | PRN

## 2022-02-21 MED ORDER — LIDOCAINE HCL (PF) 1 % IJ SOLN
INTRAMUSCULAR | Status: DC | PRN
  Administered 2022-02-21: 4 mL via EPIDURAL
  Administered 2022-02-21: 5 mL via EPIDURAL

## 2022-02-21 MED ORDER — HYDROXYZINE HCL 50 MG PO TABS: 50.0000 mg | ORAL_TABLET | Freq: Four times a day (QID) | ORAL | Status: DC | PRN

## 2022-02-21 MED ORDER — OXYTOCIN-SODIUM CHLORIDE 30-0.9 UT/500ML-% IV SOLN
2.5000 [IU]/h | INTRAVENOUS | Status: DC
  Filled 2022-02-21: qty 500

## 2022-02-21 MED ORDER — PHENYLEPHRINE 80 MCG/ML (10ML) SYRINGE FOR IV PUSH (FOR BLOOD PRESSURE SUPPORT): 80.0000 ug | PREFILLED_SYRINGE | INTRAVENOUS | Status: DC | PRN

## 2022-02-21 MED ORDER — LACTATED RINGERS IV SOLN: INTRAVENOUS | Status: DC

## 2022-02-21 MED ORDER — EPHEDRINE 5 MG/ML INJ: 10.0000 mg | INTRAVENOUS | Status: DC | PRN

## 2022-02-21 MED ORDER — LIDOCAINE HCL (PF) 1 % IJ SOLN: 30.0000 mL | INTRAMUSCULAR | Status: DC | PRN

## 2022-02-21 MED ORDER — ACETAMINOPHEN 325 MG PO TABS: 650.0000 mg | ORAL_TABLET | ORAL | Status: DC | PRN

## 2022-02-21 MED ORDER — FENTANYL-BUPIVACAINE-NACL 0.5-0.125-0.9 MG/250ML-% EP SOLN
12.0000 mL/h | EPIDURAL | Status: DC | PRN
  Administered 2022-02-21: 12 mL/h via EPIDURAL
  Filled 2022-02-21: qty 250

## 2022-02-21 MED ORDER — OXYCODONE-ACETAMINOPHEN 5-325 MG PO TABS: 1.0000 | ORAL_TABLET | ORAL | Status: DC | PRN

## 2022-02-21 MED ORDER — DIPHENHYDRAMINE HCL 50 MG/ML IJ SOLN: 12.5000 mg | INTRAMUSCULAR | Status: DC | PRN

## 2022-02-21 MED ORDER — OXYCODONE-ACETAMINOPHEN 5-325 MG PO TABS: 2.0000 | ORAL_TABLET | ORAL | Status: DC | PRN

## 2022-02-21 MED ORDER — ONDANSETRON HCL 4 MG/2ML IJ SOLN: 4.0000 mg | Freq: Four times a day (QID) | INTRAMUSCULAR | Status: DC | PRN

## 2022-02-21 MED ORDER — SOD CITRATE-CITRIC ACID 500-334 MG/5ML PO SOLN: 30.0000 mL | ORAL | Status: DC | PRN

## 2022-02-21 MED ORDER — LACTATED RINGERS IV SOLN: 500.0000 mL | Freq: Once | INTRAVENOUS | Status: DC

## 2022-02-21 NOTE — Progress Notes (Signed)
Labor Progress Note Deborah Mahoney is a 31 y.o. G2P1001 at [redacted]w[redacted]d presented for SOL.   S: Doing well. Feeling pelvic pressure and a trickle of liquid.   O:  BP 116/72   Pulse 78   Temp 97.9 F (36.6 C) (Oral)   Resp 18   Ht 5\' 5"  (1.651 m)   Wt 107 kg   LMP 05/17/2021 (Approximate)   SpO2 98%   BMI 39.26 kg/m  EFM: 135bpm/moderate/+accels, no decels  CVE: Dilation: 6.5 Effacement (%): 90 Cervical Position: Anterior Station: -2 Presentation: Vertex Exam by:: [email or web address], DO   A&P: 31 y.o. G2P1001 [redacted]w[redacted]d presented for SOL.  #Labor: Progressing well. AROM clear fluid. Continued expectant management. #Pain: Epidural #FWB: Cat I  #GBS negative  Serrena Linderman Autry-Lott, DO 4:46 PM

## 2022-02-21 NOTE — Progress Notes (Addendum)
Labor Progress Note Valincia Touch is a 31 y.o. G2P1001 at [redacted]w[redacted]d presented for SOL.   S: Doing well. Feeling pelvic pressure.  O:  BP 111/77   Pulse 71   Temp (!) 97.5 F (36.4 C) (Oral)   Resp 18   Ht 5\' 5"  (1.651 m)   Wt 107 kg   LMP 05/17/2021 (Approximate)   SpO2 98%   BMI 39.26 kg/m  EFM: 130bpm/moderate/+accels, early decels  CVE: Dilation: 6.5 Effacement (%): 90 Cervical Position: Anterior Station: -2 Presentation: Vertex Exam by:: [email or web address], DO   A&P: 31 y.o. G2P1001 [redacted]w[redacted]d presented for SOL.  #Labor: Progressing well. Expectant management #Pain: Epidural #FWB: Cat I, II early decels #GBS negative  Mireyah Chervenak Autry-Lott, DO 8:01 PM

## 2022-02-21 NOTE — Anesthesia Procedure Notes (Signed)
Epidural Patient location during procedure: OB Start time: 02/21/2022 3:08 PM End time: 02/21/2022 3:12 PM  Staffing Anesthesiologist: Beryle Lathe, MD Performed: anesthesiologist   Preanesthetic Checklist Completed: patient identified, IV checked, risks and benefits discussed, monitors and equipment checked, pre-op evaluation and timeout performed  Epidural Patient position: sitting Prep: DuraPrep Patient monitoring: continuous pulse ox and blood pressure Approach: midline Location: L3-L4 Injection technique: LOR saline  Needle:  Needle type: Tuohy  Needle gauge: 17 G Needle length: 9 cm Needle insertion depth: 6.5 cm Catheter size: 19 Gauge Catheter at skin depth: 11 cm Test dose: negative and Other (1% lidocaine)  Assessment Events: blood not aspirated  Additional Notes Patient identified. Risks including, but not limited to, bleeding, infection, nerve damage, paralysis, inadequate analgesia, blood pressure changes, nausea, vomiting, allergic reaction, postpartum back pain, itching, and headache were discussed. Patient expressed understanding and wished to proceed. Sterile prep and drape, including hand hygiene, mask, and sterile gloves were used. The patient was positioned and the spine was prepped. The skin was anesthetized with lidocaine. No paraesthesia or other complication noted. The patient did not experience any signs of intravascular injection such as tinnitus or metallic taste in mouth, nor signs of intrathecal spread such as rapid motor block. Please see nursing notes for vital signs. The patient tolerated the procedure well.   Leslye Peer, MDReason for block:procedure for pain

## 2022-02-21 NOTE — Anesthesia Preprocedure Evaluation (Signed)
Anesthesia Evaluation  Patient identified by MRN, date of birth, ID band Patient awake    Reviewed: Allergy & Precautions, NPO status , Patient's Chart, lab work & pertinent test results  History of Anesthesia Complications Negative for: history of anesthetic complications  Airway Mallampati: II   Neck ROM: Full    Dental   Pulmonary former smoker,    Pulmonary exam normal        Cardiovascular negative cardio ROS Normal cardiovascular exam     Neuro/Psych  Headaches, PSYCHIATRIC DISORDERS Anxiety    GI/Hepatic negative GI ROS, Neg liver ROS,   Endo/Other   Obesity   Renal/GU negative Renal ROS     Musculoskeletal negative musculoskeletal ROS (+)   Abdominal   Peds  Hematology  (+) Blood dyscrasia, anemia ,  Plt 247k    Anesthesia Other Findings   Reproductive/Obstetrics (+) Pregnancy                             Anesthesia Physical Anesthesia Plan  ASA: 2  Anesthesia Plan: Epidural   Post-op Pain Management:    Induction:   PONV Risk Score and Plan: 2 and Treatment may vary due to age or medical condition  Airway Management Planned: Natural Airway  Additional Equipment: None  Intra-op Plan:   Post-operative Plan:   Informed Consent: I have reviewed the patients History and Physical, chart, labs and discussed the procedure including the risks, benefits and alternatives for the proposed anesthesia with the patient or authorized representative who has indicated his/her understanding and acceptance.       Plan Discussed with: Anesthesiologist  Anesthesia Plan Comments: (Labs reviewed. Platelets acceptable, patient not taking any blood thinning medications. Per RN, FHR tracing reported to be stable enough for sitting procedure. Risks and benefits discussed with patient, including PDPH, backache, epidural hematoma, failed epidural, blood pressure changes, allergic  reaction, and nerve injury. Patient expressed understanding and wished to proceed.)        Anesthesia Quick Evaluation

## 2022-02-21 NOTE — H&P (Signed)
OBSTETRIC ADMISSION HISTORY AND PHYSICAL  Deborah Mahoney is a 31 y.o. female G2P1001 with IUP at [redacted]w[redacted]d by LMP presenting for SOL. She reports +FMs, No LOF, no VB, no headache, and RUQ pain.  She plans on breast feeding. She is unsure of method for birth control but contemplating interval BTL v. Her husband getting a vasectomy. She [facility]    Dating: By LMP --->  Estimated Date of Delivery: 02/21/22  Sono:    @[redacted]w[redacted]d , CWD, normal anatomy 3/20, cephalic presentation, anterior placental lie, 3841g, 94% EFW   Prenatal History/Complications:  -Anemia in pregnancy -Migraine headaches  Past Medical History: Past Medical History:  Diagnosis Date   Anemia    Generalized anxiety disorder 01/23/2016   GAD7- 14 PHQ9- 4   Infection    UTI   Migraine without status migrainosus, not intractable 02/17/2017   Seasonal allergies     Past Surgical History: Past Surgical History:  Procedure Laterality Date   NO PAST SURGERIES      Obstetrical History: OB History     Gravida  2   Para  1   Term  1   Preterm      AB      Living  1      SAB      IAB      Ectopic      Multiple      Live Births  1           Social History Social History   Socioeconomic History   Marital status: Single    Spouse name: Not on file   Number of children: Not on file   Years of education: Not on file   Highest education level: Not on file  Occupational History   Not on file  Tobacco Use   Smoking status: Former   Smokeless tobacco: Former  04/19/2017 Use: Never used  Substance and Sexual Activity   Alcohol use: Not Currently    Comment: occ stoppped with pregnancy   Drug use: Not Currently    Types: Marijuana    Comment: stopped when found out pregnant   Sexual activity: Yes    Birth control/protection: None  Other Topics Concern   Not on file  Social History Narrative   Not on file   Social Determinants of Health   Financial  Resource Strain: Not on file  Food Insecurity: Food Insecurity Present (01/28/2022)   Hunger Vital Sign    Worried About Running Out of Food in the Last Year: Never true    Ran Out of Food in the Last Year: Sometimes true  Transportation Needs: No Transportation Needs (01/28/2022)   PRAPARE - 01/30/2022 (Medical): No    Lack of Transportation (Non-Medical): No  Physical Activity: Not on file  Stress: Not on file  Social Connections: Not on file    Family History: Family History  Problem Relation Age of Onset   Hypertension Mother    Hyperlipidemia Father    Hypertension Father    Hearing loss Neg Hx     Allergies: No Known Allergies  Medications Prior to Admission  Medication Sig Dispense Refill Last Dose   aspirin EC 81 MG tablet Take 1 tablet (81 mg total) by mouth daily. Swallow whole. 30 tablet 8 Past Week   Prenatal Vit-Fe Fumarate-FA (PRENATAL MULTIVITAMIN) TABS tablet Take 1 tablet by mouth daily at 12 noon. 30 tablet 9  Past Week     Review of Systems   All systems reviewed and negative except as stated in HPI  Blood pressure 118/71, pulse 85, temperature 98.1 F (36.7 C), temperature source Oral, resp. rate 20, height 5\' 5"  (1.651 m), weight 107 kg, last menstrual period 05/17/2021, SpO2 98 %. General appearance: alert and no distress Lungs: normal effort Heart: regular rate noted Abdomen: gravid Pelvic: deferred, see exam below Presentation: cephalic per 13/11/2020 Fetal monitoringBaseline: 130 bpm, Variability: Good {> 6 bpm), Accelerations: Reactive, and Decelerations: Absent Uterine activity Frequency: Every 2 minutes Dilation: 5 Effacement (%): 90 Station: -3   Prenatal labs: ABO, Rh: --/--/O POS (08/12 1421) Antibody: NEG (08/12 1421) Rubella: 2.63 (12/15 1608) RPR: Non Reactive (05/31 1005)  HBsAg: Negative (12/15 1608)  HIV: Non Reactive (05/31 1005)  GBS: Negative/-- (07/19 0940)  1 hr Glucola 66 Genetic screening   declined NIPS Anatomy 07-13-1991 wnl, female  Prenatal Transfer Tool  Maternal Diabetes: No Genetic Screening: Declined Maternal Ultrasounds/Referrals: Normal Fetal Ultrasounds or other Referrals:  None Maternal Substance Abuse:  No Significant Maternal Medications:  None Significant Maternal Lab Results: Group B Strep negative  Results for orders placed or performed during the hospital encounter of 02/21/22 (from the past 24 hour(s))  CBC   Collection Time: 02/21/22  2:21 PM  Result Value Ref Range   WBC 8.6 4.0 - 10.5 K/uL   RBC 3.39 (L) 3.87 - 5.11 MIL/uL   Hemoglobin 8.9 (L) 12.0 - 15.0 g/dL   HCT 04/23/22 (L) 48.5 - 46.2 %   MCV 82.3 80.0 - 100.0 fL   MCH 26.3 26.0 - 34.0 pg   MCHC 31.9 30.0 - 36.0 g/dL   RDW 70.3 50.0 - 93.8 %   Platelets 247 150 - 400 K/uL   nRBC 0.2 0.0 - 0.2 %  Type and screen   Collection Time: 02/21/22  2:21 PM  Result Value Ref Range   ABO/RH(D) O POS    Antibody Screen NEG    Sample Expiration      02/24/2022,2359 Performed at [facility], [address], [place], [place]     Patient Active Problem List   Diagnosis Date Noted   Post term pregnancy over 40 weeks 02/21/2022   Supervision of low-risk pregnancy 07/08/2021   Migraine without aura and with status migrainosus, not intractable 09/15/2019   Seasonal allergies 11/19/2017   Anemia in pregnancy 10/19/2013    Assessment/Plan:  Deborah Mahoney is a 31 y.o. G2P1001 at [redacted]w[redacted]d here for SOL, latent.   Anemia  Hgb 8.9 -Consider need for venofer v. Blood products PP -Consider TXA at delivery   #Labor: Expectant management. Consider AROM if unchanged at next cervical exam. Plan for vaginal delivery. #Pain: Epidural #FWB: Cat I #ID:  GBS neg #MOF: Breast #MOC: Undecided #Circ:  N/a, girl  Deborah Sachs Autry-Lott, DO  02/21/2022, 3:41 PM

## 2022-02-21 NOTE — MAU Note (Signed)
Deborah Mahoney is a 31 y.o. at [redacted]w[redacted]d here in MAU reporting: ctxs that are 3 minutes apart.  Reports ctxs began @  approx 1030 this morning.  Reports bloody show present, denies VB similar to menstrual cycle or LOF.  Endorses +FM. LMP: N/A Onset of complaint: today Pain score: 8/10 Vitals:   02/21/22 1324  BP: 111/74  Pulse: (!) 102  Resp: 20  Temp: 98.1 F (36.7 C)  SpO2: 98%     EHU:[id number] (maternal apparel) Lab orders placed from triage:   N/A

## 2022-02-22 ENCOUNTER — Encounter (HOSPITAL_COMMUNITY): Payer: Self-pay | Admitting: Obstetrics and Gynecology

## 2022-02-22 DIAGNOSIS — Z3A40 40 weeks gestation of pregnancy: Secondary | ICD-10-CM | POA: Diagnosis not present

## 2022-02-22 DIAGNOSIS — O9902 Anemia complicating childbirth: Secondary | ICD-10-CM | POA: Diagnosis not present

## 2022-02-22 DIAGNOSIS — O480 Post-term pregnancy: Secondary | ICD-10-CM | POA: Diagnosis not present

## 2022-02-22 LAB — RPR: RPR Ser Ql: NONREACTIVE

## 2022-02-22 MED ORDER — DIBUCAINE (PERIANAL) 1 % EX OINT: 1.0000 | TOPICAL_OINTMENT | CUTANEOUS | Status: DC | PRN

## 2022-02-22 MED ORDER — TETANUS-DIPHTH-ACELL PERTUSSIS 5-2.5-18.5 LF-MCG/0.5 IM SUSY: 0.5000 mL | PREFILLED_SYRINGE | Freq: Once | INTRAMUSCULAR | Status: DC

## 2022-02-22 MED ORDER — ONDANSETRON HCL 4 MG/2ML IJ SOLN: 4.0000 mg | INTRAMUSCULAR | Status: DC | PRN

## 2022-02-22 MED ORDER — OXYTOCIN-SODIUM CHLORIDE 30-0.9 UT/500ML-% IV SOLN
1.0000 m[IU]/min | INTRAVENOUS | Status: DC
  Administered 2022-02-22: 2 m[IU]/min via INTRAVENOUS

## 2022-02-22 MED ORDER — SODIUM CHLORIDE 0.9% FLUSH: 3.0000 mL | Freq: Two times a day (BID) | INTRAVENOUS | Status: DC

## 2022-02-22 MED ORDER — SENNOSIDES-DOCUSATE SODIUM 8.6-50 MG PO TABS
2.0000 | ORAL_TABLET | ORAL | Status: DC
  Administered 2022-02-22 – 2022-02-23 (×2): 2 via ORAL
  Filled 2022-02-22 (×3): qty 2

## 2022-02-22 MED ORDER — SODIUM CHLORIDE 0.9% FLUSH: 3.0000 mL | INTRAVENOUS | Status: DC | PRN

## 2022-02-22 MED ORDER — LACTATED RINGERS AMNIOINFUSION: INTRAVENOUS | Status: DC

## 2022-02-22 MED ORDER — SODIUM CHLORIDE 0.9 % IV SOLN: INTRAVENOUS | Status: DC | PRN

## 2022-02-22 MED ORDER — WITCH HAZEL-GLYCERIN EX PADS: 1.0000 | MEDICATED_PAD | CUTANEOUS | Status: DC | PRN

## 2022-02-22 MED ORDER — BENZOCAINE-MENTHOL 20-0.5 % EX AERO
1.0000 | INHALATION_SPRAY | CUTANEOUS | Status: DC | PRN
  Administered 2022-02-22: 1 via TOPICAL
  Filled 2022-02-22: qty 56

## 2022-02-22 MED ORDER — PRENATAL MULTIVITAMIN CH
1.0000 | ORAL_TABLET | Freq: Every day | ORAL | Status: DC
  Administered 2022-02-22 – 2022-02-23 (×2): 1 via ORAL
  Filled 2022-02-22 (×2): qty 1

## 2022-02-22 MED ORDER — TERBUTALINE SULFATE 1 MG/ML IJ SOLN: 0.2500 mg | Freq: Once | INTRAMUSCULAR | Status: DC | PRN

## 2022-02-22 MED ORDER — ZOLPIDEM TARTRATE 5 MG PO TABS: 5.0000 mg | ORAL_TABLET | Freq: Every evening | ORAL | Status: DC | PRN

## 2022-02-22 MED ORDER — OXYCODONE HCL 5 MG PO TABS: 5.0000 mg | ORAL_TABLET | ORAL | Status: DC | PRN

## 2022-02-22 MED ORDER — SIMETHICONE 80 MG PO CHEW: 80.0000 mg | CHEWABLE_TABLET | ORAL | Status: DC | PRN

## 2022-02-22 MED ORDER — CEFAZOLIN SODIUM-DEXTROSE 2-4 GM/100ML-% IV SOLN
2.0000 g | Freq: Once | INTRAVENOUS | Status: AC
  Administered 2022-02-22: 2 g via INTRAVENOUS
  Filled 2022-02-22: qty 100

## 2022-02-22 MED ORDER — MEASLES, MUMPS & RUBELLA VAC IJ SOLR: 0.5000 mL | Freq: Once | INTRAMUSCULAR | Status: DC

## 2022-02-22 MED ORDER — ACETAMINOPHEN 325 MG PO TABS
650.0000 mg | ORAL_TABLET | ORAL | Status: DC | PRN
  Administered 2022-02-22 – 2022-02-23 (×3): 650 mg via ORAL
  Filled 2022-02-22 (×3): qty 2

## 2022-02-22 MED ORDER — DIPHENHYDRAMINE HCL 25 MG PO CAPS: 25.0000 mg | ORAL_CAPSULE | Freq: Four times a day (QID) | ORAL | Status: DC | PRN

## 2022-02-22 MED ORDER — COCONUT OIL OIL: 1.0000 | TOPICAL_OIL | Status: DC | PRN

## 2022-02-22 MED ORDER — ONDANSETRON HCL 4 MG PO TABS: 4.0000 mg | ORAL_TABLET | ORAL | Status: DC | PRN

## 2022-02-22 MED ORDER — IBUPROFEN 600 MG PO TABS
600.0000 mg | ORAL_TABLET | Freq: Four times a day (QID) | ORAL | Status: DC
  Administered 2022-02-22 – 2022-02-23 (×5): 600 mg via ORAL
  Filled 2022-02-22 (×6): qty 1

## 2022-02-22 NOTE — Progress Notes (Signed)
Patient fully dilated, with 0+1 station.  Appears to be OP position, with baby's face to maternal left side EFM: 150bpm baseline, moderate variability, +accels, +variable decels.  IUPC placed for amnioinfusion  RN working on position changes to allow for descent

## 2022-02-22 NOTE — Lactation Note (Signed)
This note was copied from a baby's chart. Lactation Consultation Note  Patient Name: Deborah Mahoney [id number] Date: 02/22/2022 Reason for consult: L&D Initial assessment;1st time breastfeeding;Term Age:33 hours  BF parent stated she didn't BF her 1st child now 58 yrs old. BF parent would like to BF and pump and bottle feed BM. LC encouraged BF until her milk comes in, mom stated understood. Baby aggressive at the breast BF well. Mom states it hurts once in a while. But mainly OK. LC used props for positioning and support which was helpful. No swallows heard at this time. Lactation will f/u mom on MBU today. Maternal Data Does the patient have breastfeeding experience prior to this delivery?: No  Feeding    LATCH Score Latch: Grasps breast easily, tongue down, lips flanged, rhythmical sucking.  Audible Swallowing: None  Type of Nipple: Everted at rest and after stimulation  Comfort (Breast/Nipple): Soft / non-tender  Hold (Positioning): Assistance needed to correctly position infant at breast and maintain latch.  LATCH Score: 7   Lactation Tools Discussed/Used    Interventions Interventions: Adjust position;Support pillows;Assisted with latch;Skin to skin;Position options;Breast massage  Discharge    Consult Status Consult Status: Follow-up from L&D Date: 02/22/22 Follow-up type: In-patient    Charyl Dancer 02/22/2022, 3:32 AM

## 2022-02-22 NOTE — Lactation Note (Signed)
This note was copied from a baby's chart. Lactation Consultation Note  Patient Name: Deborah Mahoney [id number] Date: 02/22/2022 Reason for consult: Initial assessment;Term;1st time breastfeeding Age:33 hours   P2: Term infant at 40+1 weeks Feeding preference: Breast  Baby "[name]" was swaddled and asleep in birth parent's lap when I arrived.  Birth parent had just finished a 30 minute feeding.  She felt like "[name]" was latched well; denied discomfort with feeding.    RN in to check oxygen saturations due to some mild subcostal retractions.  Color pink to mouth and lips.  Oxygen saturations high 90's; no distress noted. Retractions no longer observed after baby moved to the bassinet and position was changed.  RN informed birth parent on how to continue observing baby for any distress and to call for assistance for questions/concerns.  Encouraged birth parent to feed on cue and to call RN/LC for latch assistance as needed.  Taught hand expression and finger fed drops.  Discussed finger feeding/spoon feeding.  Praised birth parent for her efforts.  Suggested lots of STS, breast massage and hand expression.  Support person present.   Maternal Data Has patient been taught Hand Expression?: Yes Does the patient have breastfeeding experience prior to this delivery?: No  Feeding Mother's Current Feeding Choice: Breast Milk  LATCH Score Latch: Grasps breast easily, tongue down, lips flanged, rhythmical sucking.  Audible Swallowing: A few with stimulation  Type of Nipple: Everted at rest and after stimulation  Comfort (Breast/Nipple): Soft / non-tender  Hold (Positioning): Assistance needed to correctly position infant at breast and maintain latch.  LATCH Score: 8   Lactation Tools Discussed/Used    Interventions Interventions: Breast feeding basics reviewed;Education;LC Services brochure  Discharge Pump: Hands Free WIC Program: Yes  Consult Status Consult Status:  Follow-up Date: 02/23/22 Follow-up type: In-patient    Dora Sims 02/22/2022, 8:37 AM

## 2022-02-22 NOTE — Discharge Summary (Signed)
Postpartum Discharge Summary    Patient Name: Deborah Mahoney DOB: 05-26-1991 MRN: [id number]  Date of admission: 02/21/2022 Delivery date:02/22/2022  Delivering provider: Stormy Card  Date of discharge: 02/23/2022  Admitting diagnosis: Post term pregnancy over 40 weeks [O48.0] Intrauterine pregnancy: [redacted]w[redacted]d    Secondary diagnosis:  Principal Problem:   SVD (spontaneous vaginal delivery) Active Problems:   Anemia in pregnancy   Post term pregnancy over 40 weeks  Additional problems: None    Discharge diagnosis: Term Pregnancy Delivered                                              Post partum procedures: none Augmentation: AROM Complications: None  Hospital course: Onset of Labor With Vaginal Delivery      31y.o. yo G2P1001 at 458w1das admitted in Active Labor on 02/21/2022. Patient had an uncomplicated labor course as follows:  Membrane Rupture Time/Date: 4:41 PM ,02/21/2022   Delivery Method:Vaginal, Spontaneous  Episiotomy: None  Lacerations:  None  Patient had an uncomplicated postpartum course.  She is ambulating, tolerating a regular diet, passing flatus, and urinating well. Patient is discharged home in stable condition on 02/23/22.  Newborn Data: Birth date:02/22/2022  Birth time:2:31 AM  Gender:Female  Living status:Living  Apgars:7 ,9  Weight:4140 g   Magnesium Sulfate received: No BMZ received: No Rhophylac:N/A MMR:No, Rubella Immune T-DaP:Given prenatally Flu: N/A Transfusion:No  Physical exam  Vitals:   02/22/22 1230 02/22/22 1720 02/22/22 2046 02/23/22 0615  BP: 113/72 114/72 113/69 115/73  Pulse: 63 70 62 72  Resp: _0 Temp: 98.2 F (36.8 C) 98.1 F (36.7 C) 98.1 F (36.7 C) 97.9 F (36.6 C)  TempSrc: Oral Oral Oral Oral  SpO2: 98% 97%  99%  Weight:      Height:       General: alert, cooperative, and no distress Lochia: appropriate Uterine Fundus: firm Incision: N/A DVT Evaluation: No evidence of DVT seen on physical  exam. Labs: Lab Results  Component Value Date   WBC 10.8 (H) 02/23/2022   HGB 8.2 (L) 02/23/2022   HCT 26.2 (L) 02/23/2022   MCV 83.4 02/23/2022   PLT 211 02/23/2022      Latest Ref Rng & Units 07/29/2018    2:44 PM  CMP  Glucose 65 - 99 mg/dL 74   BUN 6 - 20 mg/dL 13   Creatinine 0.57 - 1.00 mg/dL 0.75   Sodium 134 - 144 mmol/L 144   Potassium 3.5 - 5.2 mmol/L 3.7   Chloride 96 - 106 mmol/L 104   CO2 20 - 29 mmol/L 26   Calcium 8.7 - 10.2 mg/dL 9.1    Edinburgh Score:    02/23/2022    2:15 AM  Edinburgh Postnatal Depression Scale Screening Tool  I have been able to laugh and see the funny side of things. 0  I have looked forward with enjoyment to things. 0  I have blamed myself unnecessarily when things went wrong. 1  I have been anxious or worried for no good reason. 1  I have felt scared or panicky for no good reason. 0  Things have been getting on top of me. 0  I have been so unhappy that I have had difficulty sleeping. 0  I have felt sad or miserable. 0  I have been so unhappy that  I have been crying. 0  The thought of harming myself has occurred to me. 0  Edinburgh Postnatal Depression Scale Total 2     After visit meds:  Allergies as of 02/23/2022   No Known Allergies      Medication List     STOP taking these medications    aspirin EC 81 MG tablet   prenatal multivitamin Tabs tablet       TAKE these medications    acetaminophen 325 MG tablet Commonly known as: Tylenol Take 2 tablets (650 mg total) by mouth every 4 (four) hours as needed (for pain scale < 4).   ibuprofen 600 MG tablet Commonly known as: ADVIL Take 1 tablet (600 mg total) by mouth every 6 (six) hours.   Niferex Tabs Take 150 mg by mouth every other day. Can cause constipation         Discharge home in stable condition Infant Feeding: Breast Infant Disposition:home with mother Discharge instruction: per After Visit Summary and Postpartum booklet. Activity: Advance as  tolerated. Pelvic rest for 6 weeks.  Diet: routine diet Future Appointments: Future Appointments  Date Time Provider [place]  02/26/2022  9:15 AM [facility]  02/26/2022 11:15 AM [facility] [place]   Follow up Visit:  Message sent to [facility] clinic by Chiago Ndulue Please schedule this patient for a In person postpartum visit in 6 weeks with the following provider: MB Dyad with Dr Dione Plover and team Additional Postpartum F/U: none   Low risk pregnancy complicated by:  anemia in pregnancy Delivery mode:  Vaginal, Spontaneous  Anticipated Birth Control:  plan for interval vasectomy  02/23/2022 Darci Current, DO

## 2022-02-22 NOTE — Anesthesia Postprocedure Evaluation (Signed)
Anesthesia Post Note  Patient: Neri Tamashiro  Procedure(s) Performed: AN AD HOC LABOR EPIDURAL     Patient location during evaluation: Mother Baby Anesthesia Type: Epidural Level of consciousness: awake and alert Pain management: pain level controlled Vital Signs Assessment: post-procedure vital signs reviewed and stable Respiratory status: spontaneous breathing, nonlabored ventilation and respiratory function stable Cardiovascular status: stable Postop Assessment: no headache, no backache, epidural receding, no apparent nausea or vomiting, patient able to bend at knees, adequate PO intake and able to ambulate Anesthetic complications: no   No notable events documented.  Last Vitals:  Vitals:   02/22/22 0439 02/22/22 0530  BP: 108/66 (!) 101/53  Pulse: 94 82  Resp: 20 18  Temp: 36.7 C 37.1 C  SpO2: 99% 98%    Last Pain:  Vitals:   02/22/22 0640  TempSrc:   PainSc: Asleep   Pain Goal: Patients Stated Pain Goal: 2 (02/22/22 0530)                 Laban Emperor

## 2022-02-23 ENCOUNTER — Ambulatory Visit: Payer: Self-pay

## 2022-02-23 LAB — CBC
HCT: 26.2 % — ABNORMAL LOW (ref 36.0–46.0)
Hemoglobin: 8.2 g/dL — ABNORMAL LOW (ref 12.0–15.0)
MCH: 26.1 pg (ref 26.0–34.0)
MCHC: 31.3 g/dL (ref 30.0–36.0)
MCV: 83.4 fL (ref 80.0–100.0)
Platelets: 211 10*3/uL (ref 150–400)
RBC: 3.14 MIL/uL — ABNORMAL LOW (ref 3.87–5.11)
RDW: 14.5 % (ref 11.5–15.5)
WBC: 10.8 10*3/uL — ABNORMAL HIGH (ref 4.0–10.5)
nRBC: 0 % (ref 0.0–0.2)

## 2022-02-23 MED ORDER — IBUPROFEN 600 MG PO TABS: 600.0000 mg | ORAL_TABLET | Freq: Four times a day (QID) | ORAL | 0 refills | Status: DC

## 2022-02-23 MED ORDER — ACETAMINOPHEN 325 MG PO TABS: 650.0000 mg | ORAL_TABLET | ORAL | 0 refills | Status: DC | PRN

## 2022-02-23 MED ORDER — NIFEREX PO TABS: 150.0000 mg | ORAL_TABLET | ORAL | 0 refills | Status: DC

## 2022-02-23 NOTE — Lactation Note (Signed)
This note was copied from a baby's chart. Lactation Consultation Note  Patient Name: Deborah Mahoney [name] Date: 02/23/2022 Reason for consult: Follow-up assessment;Mother's request;Difficult latch;1st time breastfeeding;Term;Breastfeeding assistance;Nipple pain/trauma Age:31 years LC assisted with latch getting more depth on breast. Birth parent complained of pain with bruising, skin intact. With changes in latch, pain resolved. Hand pump provided with 21 flange to pre pump.   Plan 1. To feed based on cues 8-12x 24hr period.  2. Birth parent to offer breasts and look for signs of milk transfer.  3. If unable to latch, hand express and offer colostrum then try a latch.  Birth parent hands free pump at home. LC support from [facility] and OBGYN office.   Maternal Data Has patient been taught Hand Expression?: Yes  Feeding Mother's Current Feeding Choice: Breast Milk  LATCH Score Latch: Repeated attempts needed to sustain latch, nipple held in mouth throughout feeding, stimulation needed to elicit sucking reflex.  Audible Swallowing: Spontaneous and intermittent  Type of Nipple: Everted at rest and after stimulation  Comfort (Breast/Nipple): Filling, red/small blisters or bruises, mild/mod discomfort  Hold (Positioning): Assistance needed to correctly position infant at breast and maintain latch.  LATCH Score: 7   Lactation Tools Discussed/Used Tools: Pump;Flanges;Comfort gels Flange Size: 21 Breast pump type: Manual Pump Education: Setup, frequency, and cleaning;Milk Storage Reason for Pumping: elongate nipple Pumping frequency: pre pump before latching 6 strokes each breast  Interventions Interventions: Breast feeding basics reviewed;Assisted with latch;Skin to skin;Breast massage;Hand express;Pre-pump if needed;Breast compression;Adjust position;Support pillows;Position options;Expressed milk;Comfort gels;Education;LC Psychologist, educational;Infant Driven Feeding Algorithm  education  Discharge Discharge Education: Engorgement and breast care;Warning signs for feeding baby;Outpatient recommendation;Outpatient Epic message sent Pump: Manual;Personal WIC Program: Yes  Consult Status Consult Status: Complete Date: 02/23/22    Deborah Mahoney  Deborah Mahoney 02/23/2022, 4:16 PM

## 2022-02-23 NOTE — Social Work (Signed)
CSW received consult for hx of anxiety.  CSW met with MOB to offer support and complete assessment.    CSW met with MOB at bedside and introduced CSW role. CSW observed MOB up in bed and FOB at bedside holding the infant. MOB presented pleasant and engaged with CSW during the visit. MOB gave CSW permission to share all information with FOB present. CSW inquired how MOB has felt since giving birth. MOB stated, "feeling sore but I am doing alright." MOB expressed the Motrin and given by RN has helped. MOB shared that she felt good during the pregnancy. MOB disclosed that she has history of anxiety diagnosed in 09-17-13 after her father passed away. MOB reported since then she has had bouts of anxiety that are manageable with support from her husband. She also identified her mom as a support. MOB stated her anxiety symptoms have not required medication or therapy treatment. CSW discussed PPD/PPA. MOB reported that after the birth of her older child she was experienced many different emotions" after her died had passed away she is not sure if she had PPD/PPA. MOB was receptive to the resources provided. CSW provided education regarding the baby blues period vs. perinatal mood disorders, discussed treatment and gave resources for mental health follow up if concerns arise. CSW recommended MOB complete a self-evaluation during the postpartum time period using the New Mom Checklist from Postpartum Progress and encouraged MOB to contact a medical professional if symptoms are noted at any time.  CSW assessed MOB for safety. MOB denied thoughts of harm to self and others.    MOB reported she all items for the infant including a bassinet where the infant will sleep. CSW provided review of Sudden Infant Death Syndrome (SIDS) precautions. MOB reported that is still deciding on pediatrician for the infant. MOB reported that she received WIC/FS and will call to update both agencies about the birth.   CSW identifies no further  need for intervention and no barriers to discharge at this time.   Kathrin Greathouse, MSW, LCSW [facility] Worker  [phone] 02/23/2022  1:00 PM

## 2022-02-26 ENCOUNTER — Encounter: Payer: Medicaid Other | Admitting: Obstetrics and Gynecology

## 2022-02-26 ENCOUNTER — Other Ambulatory Visit: Payer: Medicaid Other

## 2022-02-28 ENCOUNTER — Inpatient Hospital Stay (HOSPITAL_COMMUNITY): Payer: Medicaid Other

## 2022-02-28 ENCOUNTER — Inpatient Hospital Stay (HOSPITAL_COMMUNITY): Admission: AD | Admit: 2022-02-28 | Payer: Medicaid Other | Source: Home / Self Care | Admitting: Family Medicine

## 2022-03-05 ENCOUNTER — Telehealth (HOSPITAL_COMMUNITY): Payer: Self-pay | Admitting: *Deleted

## 2022-03-05 NOTE — Telephone Encounter (Signed)
Left phone voicemail message.  Duffy Rhody, RN 03-05-2022 at 1:47pm

## 2022-03-23 ENCOUNTER — Encounter: Payer: Self-pay | Admitting: Advanced Practice Midwife

## 2022-04-01 ENCOUNTER — Ambulatory Visit: Payer: Medicaid Other | Admitting: Family Medicine

## 2022-04-27 ENCOUNTER — Encounter: Payer: Self-pay | Admitting: Advanced Practice Midwife

## 2022-06-18 ENCOUNTER — Ambulatory Visit (INDEPENDENT_AMBULATORY_CARE_PROVIDER_SITE_OTHER): Payer: Medicaid Other

## 2022-06-18 DIAGNOSIS — Z3200 Encounter for pregnancy test, result unknown: Secondary | ICD-10-CM

## 2022-06-18 NOTE — Progress Notes (Signed)
Pt left urine for walk in pregnancy test.  Called pt to inform her that she did not leave any urine for the test to completed.  Pt stated that she had went to the bathroom before coming to the office and couldn't go again.  Pt stated that she will come tomorrow at 06/19/22 to leave another urine.  Front office notified.  Leonette Nutting  06/18/22

## 2022-06-19 ENCOUNTER — Ambulatory Visit (INDEPENDENT_AMBULATORY_CARE_PROVIDER_SITE_OTHER): Payer: Medicaid Other | Admitting: *Deleted

## 2022-06-19 ENCOUNTER — Encounter: Payer: Self-pay | Admitting: *Deleted

## 2022-06-19 DIAGNOSIS — Z3200 Encounter for pregnancy test, result unknown: Secondary | ICD-10-CM

## 2022-06-19 DIAGNOSIS — Z3490 Encounter for supervision of normal pregnancy, unspecified, unspecified trimester: Secondary | ICD-10-CM

## 2022-06-19 DIAGNOSIS — Z3201 Encounter for pregnancy test, result positive: Secondary | ICD-10-CM

## 2022-06-19 LAB — POCT PREGNANCY, URINE: Preg Test, Ur: POSITIVE — AB

## 2022-06-19 NOTE — Progress Notes (Signed)
Pt submitted urine for pregnancy test which is positive. She was called and informed of results. Pt reports that she received prenatal care in this office recently and delivered her baby on 02/22/22, then had normal period on approximately 03/22/22. This LMP makes her [redacted]w[redacted]d EGA and EDD 12/27/22. She is happy about the pregnancy. She denies vaginal bleeding or abdominal pain. Due to recent pregnancy and approximate LMP, pt will be scheduled for [place] to confirm dating. Pt was advised to begin taking prenatal vitamins. She will be scheduled for New Ob intake and New Ob provider appts. Pt voiced understanding of all information and instructions given.

## 2022-06-25 ENCOUNTER — Ambulatory Visit (INDEPENDENT_AMBULATORY_CARE_PROVIDER_SITE_OTHER): Payer: Medicaid Other

## 2022-06-25 ENCOUNTER — Ambulatory Visit
Admission: RE | Admit: 2022-06-25 | Discharge: 2022-06-25 | Disposition: A | Discharge status: Home / Self Care | Payer: Medicaid Other | Source: Ambulatory Visit | Attending: Advanced Practice Midwife | Admitting: Advanced Practice Midwife

## 2022-06-25 VITALS — BP 114/65 | HR 56 | Ht 65.0 in | Wt 222.3 lb

## 2022-06-25 DIAGNOSIS — Z3A01 Less than 8 weeks gestation of pregnancy: Secondary | ICD-10-CM | POA: Diagnosis not present

## 2022-06-25 DIAGNOSIS — O208 Other hemorrhage in early pregnancy: Secondary | ICD-10-CM | POA: Insufficient documentation

## 2022-06-25 DIAGNOSIS — O26891 Other specified pregnancy related conditions, first trimester: Secondary | ICD-10-CM | POA: Diagnosis not present

## 2022-06-25 DIAGNOSIS — Z3490 Encounter for supervision of normal pregnancy, unspecified, unspecified trimester: Secondary | ICD-10-CM | POA: Insufficient documentation

## 2022-06-25 DIAGNOSIS — Z3687 Encounter for antenatal screening for uncertain dates: Secondary | ICD-10-CM | POA: Diagnosis present

## 2022-06-25 NOTE — Progress Notes (Signed)
Pt here today for viability [place] results.  US shows single living intrauterine pregnancy at 7w 2d and EDD 02/09/23 with a small subchronic hematoma.  Reviewed results with Dr. Shawnie Pons who agrees with radiologist recommendation.  Pt informed of good pregnancy, gestational age, and EDD.  Pt also given information about subchronic hematoma and informed about subchronic hematoma.  Pt reminded of intake appt on 07/22/22 and OB provider appt on 07/29/22.  Pt verbalized understanding to all that was discussed and encouraged to call the office with questions.   Leonette Nutting  06/25/22

## 2022-06-25 NOTE — Patient Instructions (Signed)
Subchorionic Hematoma  A hematoma is a collection of blood outside of the blood vessels. A subchorionic hematoma is a collection of blood between the outer wall of the embryo (chorion) and the inner wall of the uterus. This condition can cause vaginal bleeding. Early small hematomas usually shrink on their own and do not affect your baby or pregnancy. When bleeding starts later in pregnancy, or if the hematoma is larger or occurs in older pregnant women, the condition may be more serious. Larger hematomas increase the chances of miscarriage. This condition also increases the risk of: Premature separation of the placenta from the uterus. Premature (preterm) labor. Stillbirth. What are the causes? The exact cause of this condition is not known. It occurs when blood is trapped between the placenta and the uterine wall because the placenta has separated from the original site of implantation. What increases the risk? You are more likely to develop this condition if: You were treated with fertility medicines. You became pregnant through in vitro fertilization (IVF). What are the signs or symptoms? Symptoms of this condition include: Vaginal spotting or bleeding. Abdominal pain. This is rare. Sometimes you may have no symptoms and the bleeding may only be seen when ultrasound images are taken (transvaginal ultrasound). How is this diagnosed? This condition is diagnosed based on a physical exam. This includes a pelvic exam. You may also have other tests, including: Blood tests. Urine tests. Ultrasound of the abdomen. How is this treated? Treatment for this condition can vary. Treatment may include: Watchful waiting. You will be monitored closely for any changes in bleeding. Medicines. Activity restriction. This may be needed until the bleeding stops. A medicine called Rh immunoglobulin. This is given if you have an Rh-negative blood type. It prevents Rh sensitization. Follow these instructions  at home: Stay on bed rest if told to do so by your health care provider. Do not lift anything that is heavier than 10 lb (4.5 kg), or the limit that you are told by your health care provider. Track and write down the number of pads you use each day and how soaked (saturated) they are. Do not use tampons. Keep all follow-up visits. This is important. Your health care provider may ask you to have follow-up blood tests or ultrasound tests or both. Contact a health care provider if: You have any vaginal bleeding. You have a fever. Get help right away if: You have severe cramps in your stomach, back, abdomen, or pelvis. You pass large clots or tissue. Save any tissue for your health care provider to look at. You faint. You become light-headed or weak. Summary A subchorionic hematoma is a collection of blood between the outer wall of the embryo (chorion) and the inner wall of the uterus. This condition can cause vaginal bleeding. Sometimes you may have no symptoms and the bleeding may only be seen when ultrasound images are taken. Treatment may include watchful waiting, medicines, or activity restriction. Keep all follow-up visits. Get help right away if you have severe cramps or heavy vaginal bleeding. This information is not intended to replace advice given to you by your health care provider. Make sure you discuss any questions you have with your health care provider. Document Revised: 03/25/2020 Document Reviewed: 03/25/2020 Elsevier Patient Education  2023 Elsevier Inc[employer]  

## 2022-07-13 NOTE — L&D Delivery Note (Addendum)
[name] is a 31 y.o. G3P2002 at [redacted]w[redacted]d admitted for IOL d/t A1DM.   GBS Status: Negative/-- (07/01 1547) Maximum Maternal Temperature: 98.8  Labor course: Initial SVE: 2.5/50/-3. Augmentation with: AROM and Pitocin. She then progressed to complete.  ROM: 5h 23m with clear fluid  Birth: At 2144 a viable female was delivered via spontaneous vaginal delivery (Presentation: LOA). Nuchal cord present: Yes, loose necklace, delivered through and reduced immediately after birth.  Shoulders and body delivered in usual fashion. Infant placed directly on mom's abdomen for bonding/skin-to-skin, baby dried and stimulated. Cord clamped x 2 after 1 minute and cut by FOB.  Cord blood collected.  The placenta separated spontaneously and delivered via gentle cord traction.  Pitocin infused rapidly IV per protocol. TXA also given d/t admit hgb 9.1, multip, IOL.  Fundus firm with massage.  Placenta inspected and appears to be intact with a 3 VC.  Placenta/Cord with the following complications: none .  Cord pH: not done Sponge and instrument count were correct x2.  Intrapartum complications:  None Anesthesia:  epidural Episiotomy: none Lacerations:  none Suture Repair:  n/a EBL (mL): 52.00    Infant: APGAR (1 MIN):  vigorous, refer to delivery report APGAR (5 MINS):   APGAR (10 MINS):    Infant weight: pending  Delivery Report: Review the Delivery Report for details.    Mom to postpartum.  Baby to Couplet care / Skin to Skin. Placenta to L&D   Plans to Breastfeed Contraception: BTL, consent 5/17 Circumcision: N/A  Note sent to [facility]: MCW for pp visit.  [name] CNM, WHNP-BC 02/09/2023 10:00 PM

## 2022-07-22 ENCOUNTER — Telehealth (INDEPENDENT_AMBULATORY_CARE_PROVIDER_SITE_OTHER): Payer: Medicaid Other

## 2022-07-22 DIAGNOSIS — Z3689 Encounter for other specified antenatal screening: Secondary | ICD-10-CM

## 2022-07-22 DIAGNOSIS — Z3480 Encounter for supervision of other normal pregnancy, unspecified trimester: Secondary | ICD-10-CM

## 2022-07-22 MED ORDER — GOJJI WEIGHT SCALE MISC: 1.0000 | 0 refills | Status: DC | PRN

## 2022-07-22 MED ORDER — BLOOD PRESSURE KIT DEVI: 1.0000 | 0 refills | Status: DC | PRN

## 2022-07-22 NOTE — Progress Notes (Signed)
New OB Intake  I connected with Deborah Mahoney  on 07/22/22 at 10:15 AM EST by MyChart Video Visit and verified that I am speaking with the correct person using two identifiers. Nurse is located at [facility] and pt is located at home.  I discussed the limitations, risks, security and privacy concerns of performing an evaluation and management service by telephone and the availability of in person appointments. I also discussed with the patient that there may be a patient responsible charge related to this service. The patient expressed understanding and agreed to proceed.  I explained I am completing New OB Intake today. We discussed EDD of 02/09/2023 that is based on ultrasound of 06/25/2022. Pt is G3/P2. I reviewed her allergies, medications, Medical/Surgical/OB history, and appropriate screenings. I informed her of [facility] services. [facility] information placed in AVS. Based on history, this is a low risk pregnancy.  Patient Active Problem List   Diagnosis Date Noted   Short interval between pregnancies affecting pregnancy in first trimester, antepartum 02/21/2022   Migraine without aura and with status migrainosus, not intractable 09/15/2019   Anemia in pregnancy 10/19/2013   Supervision of other normal pregnancy, antepartum 04/27/2013     Concerns addressed today  Delivery Plans Plans to deliver at [facility]. Patient given information for [facility] Healthy Baby website for more information about Women's and [place]. Patient is not interested in water birth. Offered upcoming OB visit with CNM to discuss further.  MyChart/Babyscripts MyChart access verified. I explained pt will have some visits in office and some virtually. Babyscripts instructions given and order placed. Patient verifies receipt of registration text/e-mail. Account successfully created and app downloaded.  Blood Pressure Cuff/Weight Scale Blood pressure cuff ordered for patient to pick-up from [employer]. Explained after  first prenatal appt pt will check weekly and document in 53. Patient does not have weight scale; order sent to [facility], patient may track weight weekly in Babyscripts.  Anatomy US Explained first scheduled [place] will be around 19 weeks. Anatomy US scheduled for 09/18/2022 at 10:30 AM. Pt notified to arrive at 10:15 AM.  Labs Discussed Natera genetic screening with patient. Would like both Panorama and Horizon drawn at new OB visit. Routine prenatal labs needed.  COVID Vaccine Patient has had COVID vaccine.   Is patient a CenteringPregnancy candidate?  Declined Declined due to Schedule  Is patient a Mom+Baby Combined Care candidate?  Not a candidate   If accepted, Mom+Baby staff notified  Social Determinants of Health Food Insecurity: Patient denies food insecurity. WIC Referral: Patient is interested in referral to [facility].  Transportation: Patient denies transportation needs. Childcare: Discussed no children allowed at ultrasound appointments. Offered childcare services; patient declines childcare services at this time.  First visit review I reviewed new OB appt with patient. I explained they will have a provider visit that includes initial ob labs, genetic screening, OB Culture, GC/CH, and Hemoglobin A1C. Explained pt will be seen by Deborah Halim MD at first visit; encounter routed to appropriate provider. Explained that patient will be seen by pregnancy navigator following visit with provider.   Deborah Mahoney, [place] 07/22/2022  10:15 AM

## 2022-07-29 ENCOUNTER — Encounter: Payer: Medicaid Other | Admitting: Obstetrics and Gynecology

## 2022-08-05 ENCOUNTER — Ambulatory Visit (INDEPENDENT_AMBULATORY_CARE_PROVIDER_SITE_OTHER): Payer: Medicaid Other | Admitting: Family Medicine

## 2022-08-05 ENCOUNTER — Other Ambulatory Visit: Payer: Self-pay

## 2022-08-05 ENCOUNTER — Encounter: Payer: Self-pay | Admitting: Family Medicine

## 2022-08-05 VITALS — BP 105/72 | HR 87 | Wt 223.4 lb

## 2022-08-05 DIAGNOSIS — B002 Herpesviral gingivostomatitis and pharyngotonsillitis: Secondary | ICD-10-CM | POA: Diagnosis not present

## 2022-08-05 DIAGNOSIS — Z3480 Encounter for supervision of other normal pregnancy, unspecified trimester: Secondary | ICD-10-CM | POA: Diagnosis not present

## 2022-08-05 DIAGNOSIS — Z3481 Encounter for supervision of other normal pregnancy, first trimester: Secondary | ICD-10-CM

## 2022-08-05 DIAGNOSIS — O99012 Anemia complicating pregnancy, second trimester: Secondary | ICD-10-CM

## 2022-08-05 DIAGNOSIS — Z3A13 13 weeks gestation of pregnancy: Secondary | ICD-10-CM | POA: Diagnosis not present

## 2022-08-05 MED ORDER — VALACYCLOVIR HCL 1 G PO TABS: 1000.0000 mg | ORAL_TABLET | Freq: Every day | ORAL | 2 refills | Status: DC

## 2022-08-05 NOTE — Patient Instructions (Addendum)
Safe Medications in Pregnancy   Acne: Benzoyl Peroxide Salicylic Acid  Backache/Headache: Tylenol: 2 regular strength every 4 hours OR              2 Extra strength every 6 hours  Colds/Coughs/Allergies: Benadryl (alcohol free) 25 mg every 6 hours as needed Breath right strips Claritin Cepacol throat lozenges Chloraseptic throat spray Cold-Eeze- up to three times per day Cough drops, alcohol free Flonase (by prescription only) Guaifenesin Mucinex Robitussin DM (plain only, alcohol free) Saline nasal spray/drops Sudafed (pseudoephedrine) & Actifed ** use only after [redacted] weeks gestation and if you do not have high blood pressure Tylenol Vicks Vaporub Zinc lozenges Zyrtec   Constipation: Colace Ducolax suppositories Fleet enema Glycerin suppositories Metamucil Milk of magnesia Miralax Senokot Smooth move tea  Diarrhea: Kaopectate Imodium A-D  *NO pepto Bismol  Hemorrhoids: Anusol Anusol HC Preparation H Tucks  Indigestion: Tums Maalox Mylanta Zantac  Pepcid  Insomnia: Benadryl (alcohol free) 25mg every 6 hours as needed Tylenol PM Unisom, no Gelcaps  Leg Cramps: Tums MagGel  Nausea/Vomiting:  Bonine Dramamine Emetrol Ginger extract Sea bands Meclizine  Nausea medication to take during pregnancy:  Unisom (doxylamine succinate 25 mg tablets) Take one tablet daily at bedtime. If symptoms are not adequately controlled, the dose can be increased to a maximum recommended dose of two tablets daily (1/2 tablet in the morning, 1/2 tablet mid-afternoon and one at bedtime). Vitamin B6 100mg tablets. Take one tablet twice a day (up to 200 mg per day).  Skin Rashes: Aveeno products Benadryl cream or 25mg every 6 hours as needed Calamine Lotion 1% cortisone cream  Yeast infection: Gyne-lotrimin 7 Monistat 7   **If taking multiple medications, please check labels to avoid duplicating the same active ingredients **take medication as directed on  the label ** Do not exceed 4000 mg of tylenol in 24 hours **Do not take medications that contain aspirin or ibuprofen      AREA PEDIATRIC/FAMILY PRACTICE PHYSICIANS  Central/Southeast [place] ([address]) [facility] Chambliss, MD; Eniola, MD; Hale, MD; Hensel, MD; McDiarmid, MD; McIntyer, MD; Neal, MD; Walden, MD [address] [place], [place] [address] [phone] Mon-Fri 8:30-12:30, 1:30-5:00 Providers come to see babies at [facility] Accepting Medicaid [facility] providers who accept newborns: Koirala, MD; Morrow, MD; Wolters, MD [address], [place], [place] [address] [phone] Mon-Fri 8:00-5:30 Babies seen by providers at [facility] Does NOT accept Medicaid Please call early in hospitalization for appointment (limited availability)  [facility] Mulberry, MD [address] [place], [place] [address] [phone] Mon, Tue, Thur, Fri 8:30-5:00, Wed 10:00-7:00 (closed 1-2pm) Babies seen by [facility] providers Accepting Medicaid Rubin - Pediatrician Rubin, MD [address], [place], [place] [address] [phone] Mon-Fri 8:30-5:00, Sat 8:30-12:00 Provider comes to see babies at [facility] Accepting Medicaid Must have been referred from current patients or contacted office prior to delivery [facility] ([facility]) Brown, MD; Chandler, MD; Ettefagh, MD; Grant, MD; Lester, MD; McCormick, MD; McQueen, MD; Prose, MD; Simha, MD; Stanley, MD; Stryffeler, NP; Tebben, NP [address], [place], [place] [address] [phone] Mon, Tue, Thur, Fri 8:30-5:30, Wed 9:30-5:30, Sat 8:30-12:30 Babies seen by [facility] providers Accepting Medicaid Only accepting infants of first-time parents or siblings of current patients Hospital discharge coordinator will make  follow-up appointment Jack Amos [address], [place], [place]  [address] [phone]     Fax - [phone] [facility] [address], [place], [place]  [address] Phone - [phone]   Fax - [phone] Shilpa Gosrani [address], [place], [place]  [address] [phone]  [place]ortheast [place] ([address]) [facility] Bates, MD; Brassfield, MD; Cooper, Cox, MD; MD; Davis, MD; Dovico, MD; Ettefaugh, MD; Little, MD; Lowe, MD; Keiffer, MD; Melvin, MD; Sumner, MD; Williams, MD [address], [place], [place] [address] [phone] Mon-Fri 8:30-5:00 (extended evenings Mon-Thur as needed), Sat-Sun 10:00-1:00 Providers come to see babies at [facility] Accepting Medicaid for families of first-time babies and families with all children in the household age 3 and under. Must register with office prior to making appointment (M-F only). [facility] Henson, NP; Knapp, MD; Lalonde, MD; Tysinger, PA [address] [place], [place] [address] [phone] Mon-Fri 8:00-5:00 Babies seen by providers at [facility] Does NOT accept Medicaid/Commercial Insurance Only [facility] ([facility])  Artis, MD; Barnes, MD; Bratton, MD; Coccaro, MD; [place] Gardner, MD; Kramer, MD; Marshall, MD; Netherton, MD; Poleto, MD; Skinner, MD [address] [place], [place] [address] [phone] Mon-Fri 8:30-5:30, Sat (Oct.-Mar.) 9:00-1:00 Babies seen by providers at [facility] Accepting Medicaid  [place] ([address]) [facility] Reid, MD; Warner, MD [address], [place], [place] [address] [phone] Mon-Fri 8:30-5:00, Sat 8:30-12:00 Providers come to see babies at [facility] Does NOT accept Medicaid [facility] Becker, PA; Hagler, MD; Scifres, PA; Sun, MD; Swayne, MD [address], [place], [place]  [address] [phone] Mon-Fri 8:00-5:00 Babies seen by providers at [facility] Does NOT accept Medicaid Only accepting babies of parents who are patients Please call early in hospitalization for appointment (limited availability) [facility] Clark, MD; Frye, MD; Kelleher, MD; Mack, NP; Miller, MD; O'Keller, MD; Patterson, NP; Pudlo, MD; Puzio, MD; Thomas, MD; Tucker, MD; Twiselton, MD [address], [place], [place] [address] [phone] Mon-Fri 8:00-5:00, Sat 9:00-12:00 Providers come to see babies at [facility] Does NOT accept Medicaid  [place] ([address]) [facility] providers accepting new patients: Brake, NP; Wharton, PA [address], [place], [place] [address] [phone] Mon-Fri 8:00-5:00 Babies seen by providers at [facility] Does NOT accept Medicaid Only accepting babies of parents who are patients Please call early in hospitalization for appointment (limited availability) [facility] Gay, MD; Quinlan, MD [address] [place], [place] [address] [phone] (press 1 to schedule appointment) Mon-Fri 8:00-5:00 Providers come to see babies at [facility] Does NOT accept Medicaid [facility] Mazer, MD [address] [place], [place] [address] [phone] Mon-Fri 8:30-5:00 (lunch 12:30-1:00), extended hours by appointment only Wed 5:00-6:30 Babies seen by [facility] providers Accepting Medicaid [facility] Banks, MD; Jordan, MD; Koberlein, MD [address], [place], [place] [address] [phone] Mon-Fri 8:00-5:00 Babies seen by [facility] providers Does NOT accept Medicaid [facility] Parker, MD; Hunter, MD; Wallace, DO [address] [place],  [address] [phone] Mon-Fri 8:00-5:00 Babies seen by [facility] providers Does NOT accept Medicaid [facility] Brandon, PA; Brecken, PA; Christy, NP; Dees, MD; DeClaire, MD; DeWeese, MD; Hansen, NP; Mills, NP; Parrish, NP; Smoot, NP; Summer, MD; Vapne, MD [address] [place],  [address] [phone] Mon-Fri 8:30-5:00, Sat 10:00-1:00 Providers come to see babies at [facility] Does NOT accept Medicaid Free prenatal information session Tuesdays at 4:45pm [facility] Bouska, MD; Gordon, PA; Jeffery, PA; Weber, PA [address] [place] [address] [phone] Mon-Fri 7:30-5:30 Babies seen by [facility] providers [facility] [address], [place], [place]  [address] [phone]   Fax - [phone]  [place] ([address] & [address]) [facility] [place] [address] [place], [place] [address] [phone] Mon-Thur 8:00-6:00 Providers come to see babies at [facility] Accepting Medicaid [facility] Anderson, NP; Badger, MD; Beal, PA; Spencer, PA [address] [place], [place] [address] [phone] Mon-Thur 7:30-7:30, Fri 7:30-4:30 Babies seen by [facility] providers Accepting Medicaid [facility] Agbuya, MD; Klett, NP; Romgoolam, MD [address], [place], [place] [address] [phone] Mon-Fri 8:30-5:00, Sat 8:30-12:00 Providers come to see babies at [facility] Accepting Medicaid Must have "Meet & Greet" appointment at office prior to delivery [facility] ([facility]) McCord, MD; Wallace, MD; Wood, MD [address], [place], [place] [address] [phone] Mon-Wed 8:00-6:00, Thur-Fri 8:00-5:00, Sat 9:00-12:00 Providers come to see babies at [facility] Does NOT accept Medicaid Only accepting siblings of current patients [facility]  [address], [place], [place]  [address] [phone]   Fax  - [phone] [facility] [address], [place], [place]  [address] [phone]   Fax - [phone]  [place] ([address] & [address]) [facility] Cirigliano, DO; Matthews, DO [address] , [place] [address] [phone] Mon-Fri 7:00-5:00 Babies seen by [facility] providers Does NOT accept Medicaid [facility] Briscoe, MD; Howley, PA; Moreira, PA [address], [place], [place] [address] [phone] Mon-Fri 8:00-5:00 Babies seen by [facility] providers Accepting Medicaid [facility] Boyd, MD; Church, PA; Jones, NP; Osborn, PA [address], , [place] [address] [phone] Mon-Fri 8:00-5:00 Babies seen by providers at [facility] Accepting Medicaid  [place] ([address]) [facility] [place] Wendling, DO [address] [place], [place] [address] [phone] Mon-Fri 8:00-5:00 Babies seen by [facility] providers Does NOT accept Medicaid Limited availability, please call early in hospitalization to schedule follow-up [facility] Calderon, PA; Cummings, MD; Dillard, MD; Martin, PA; Olson, MD; VanDeven, PA [address], [place], [place] [address] [phone] Mon-Fri 8:30-5:00, Sat 9:00-12:00 Babies seen by providers at [facility] Accepting Medicaid Please register online then schedule online or call office [email or web address] ([facility]) Hunter, NP; Kumar, MD; Martin Rogers, PA [address], [place], [place] [address] [phone] Mon-Fri 8:00-5:00 Babies seen by providers at [facility] Accepting Medicaid [facility] ([facility]) [place], MD; Kristi Fleenor, NP; [place], MD [address], [place], [place] [address] [phone] Mon-Fri 8:00-5:30, Sat&Sun by appointment (phones open at 8:30) Babies seen by [facility] providers Accepting Medicaid Must be a first-time baby or sibling of current patient [facility] [place]  [address], [place],   [address] [phone]   Fax - [phone]  [place] ([address]) [facility] Brown, PA; Cowen, PA; Rice, MD; Helton, PA; Spry, MD [address] [place],  [address] [phone] Mon-Thur 8:00-7:00, Fri 8:00-5:00, Sat 8:00-12:00, Sun 9:00-12:00 Babies seen by [facility] providers Accepting Medicaid [facility] Coe-Goins, MD; Marshall, MD; Pierre-Louis, MD [address], [place], [place] [address] [phone] Mon-Thur 8:00-5:00 Babies seen by providers at [facility] Accepting Medicaid [facility] Bratton, MD; Coe-Goins, MD; Hayes, MD; Lewis, MD; List, MD; Lott, MD; Marshall, MD; Moran, MD; O'Neal, MD; Pierre-Louis, MD; Pitonzo, MD; Scholer, MD; Spangle, MD [address] [place], [place] [address] [phone] Mon-Fri 8:00-5:30, Sat (Oct.-Mar.) 9:00-1:00 Babies seen by providers at [facility] Accepting Medicaid Must fill out new patient packet, available online at [email or web address] [facility] ([facility]) Friddle, NP; Harris, NP; Kelly, NP; Logan, MD; Melvin, PA; Poth, MD; Ramadoss, MD; Stanton, NP [address], [place], [place] [address] [phone] Mon-Thur 8:00-5:30, Fri 8:00-5:00 Babies seen by providers at [facility] Accepting Medicaid  [place] ([address]) [facility] Dixon, PA; [place], MD; Pickard, MD; Tapia, PA [address], [place], [place] [address] [phone] Mon-Fri  8:00-5:00 Babies seen by providers at [facility] Accepting Medicaid   [place] ([address]) [facility] [place] Masneri, DO; Meyers, MD; Nelson, PA [address], [place], [place] [address] [phone] Mon-Fri 8:00-5:00 Babies seen by providers at [facility] Does NOT accept Medicaid Limited appointment availability, please call early in hospitalization  [facility] [place] Kunedd, DO; McGowen, MD [address], [place], [place] [address] [phone] Mon-Fri 8:00-5:00 Babies seen by [facility] providers Does NOT accept Medicaid [facility] [place] Cameron, MD; MacDonald, MD; Michaels, PA; Nayak, MD [address] [place] [address], [place], [place] [address] [phone] Mon-Fri 8:00-5:00 After hours clinic ([address] [place], [place] [address]) [phone] Mon-Fri 5:00-8:00, Sat 12:00-6:00, Sun 10:00-4:00 Babies seen by [facility] providers Accepting Medicaid [facility] [place] [address], [place], [place]  [address] [phone]   Fax - [phone]  [place] ([address]) [facility] [place] [facility] Andy, MD [address], [place], [place] [address] [phone] Mon-Fri 8:00-5:00 Babies seen by [facility] providers Does NOT accept Medicaid [facility] [place] ([facility] [place]) Eksir, MD [address], [place], [place] [address] [phone] Mon-Thur 8:00-7:00, Fri 8:00-5:00, Sat 8:00-12:00 Babies seen by providers at [facility] Accepting Medicaid - but does not have vaccinations in office (must be received elsewhere) Limited availability, please call early in hospitalization  [place] ([address]) [facility]  Charlene Flemming, MD [address],  [place] [address] [phone]  Fax [phone]  [facility]   Kimberly Newton, MD, Annamarie Streilein, PA, Carla Hampton, PA [address] [place] [place], [place] [address] [phone] [facility]  [address], [place], [place] [address] [phone] [address], [place], [place] [address] [phone] (West Office)  [facility] [address], [place], [place] [address] [phone] [facility] [address] [place], [place], [place] [address] [phone] [facility] [address], [place], [place] [address] [phone] [facility] [address], [place], [place] [address] [phone] [facility] [address], [place], [place] [address] [phone] [facility] [address], [place], [place] [address] [phone] [facility]  [address], [place],  [address] [phone] Dr[facility] [address] [place] [address], [place],  [address] [phone] [facility] [address], [place], [place] [address] [phone] [facility] [address], [place], [place] [address] [phone]  

## 2022-08-05 NOTE — Progress Notes (Signed)
INITIAL PRENATAL VISIT  Subjective:   Deborah Mahoney is being seen today for her first obstetrical visit.  This is not a planned pregnancy. This is a desired pregnancy.  She is at [redacted]w[redacted]d gestation by LMP. Her obstetrical history is significant for  short interval- last delivery was in 02/2022 . Relationship with FOB: spouse, living together. Patient does intend to breast feed. Pregnancy history fully reviewed.  Patient reports no complaints.  Indications for ASA therapy (per uptodate) One of the following: Previous pregnancy with preeclampsia, especially early onset and with an adverse outcome No Multifetal gestation No Chronic hypertension No Type 1 or 2 diabetes mellitus No Chronic kidney disease No Autoimmune disease (antiphospholipid syndrome, systemic lupus erythematosus) No  Two or more of the following: Nulliparity No Obesity (body mass index >30 kg/m2) Yes Family history of preeclampsia in mother or sister No Age ?35 years No Sociodemographic characteristics (African American race, low socioeconomic level) No Personal risk factors (eg, previous pregnancy with low birth weight or small for gestational age infant, previous adverse pregnancy outcome [eg, stillbirth], interval >10 years between pregnancies) No  Indications for early GDM screening  First-degree relative with diabetes No BMI >30kg/m2 Yes Age > 25 Yes Previous birth of an infant weighing ?4000 g No Gestational diabetes mellitus in a previous pregnancy No Glycated hemoglobin ?5.7 percent (39 mmol/mol), impaired glucose tolerance, or impaired fasting glucose on previous testing No High-risk race/ethnicity (eg, African American, Latino, Native American, Asian American, Pacific Islander) No Previous stillbirth of unknown cause No Maternal birthweight > 9 lbs No History of cardiovascular disease No Hypertension or on therapy for hypertension No High-density lipoprotein cholesterol level <35 mg/dL (6.21 mmol/L)  and/or a triglyceride level >250 mg/dL (3.08 mmol/L) No Polycystic ovary syndrome No Physical inactivity No Other clinical condition associated with insulin resistance (eg, severe obesity, acanthosis nigricans) No Current use of glucocorticoids No   Early screening tests: FBS, A1C, Random CBG, glucose challenge   Review of Systems:   Review of Systems  Objective:    Obstetric History OB History  Gravida Para Term Preterm AB Living  3 2 2  0 0 2  SAB IAB Ectopic Multiple Live Births  0 0 0 0 2    # Outcome Date GA Lbr Len/2nd Weight Sex Delivery Anes PTL Lv  3 Current           2 Term 02/22/22 [redacted]w[redacted]d 13:06 / 02:55 9 lb 2 oz (4.14 kg) F Vag-Spont EPI  LIV  1 Term 11/04/13 [redacted]w[redacted]d 35:02 / 03:18 8 lb 2 oz (3.685 kg) F Vag-Spont EPI  LIV    Past Medical History:  Diagnosis Date   Anemia    Generalized anxiety disorder 01/23/2016   GAD7- 14 PHQ9- 4   Infection    UTI   Migraine without status migrainosus, not intractable 02/17/2017   Seasonal allergies     Past Surgical History:  Procedure Laterality Date   NO PAST SURGERIES      Current Outpatient Medications on File Prior to Visit  Medication Sig Dispense Refill   Blood Pressure Monitoring (BLOOD PRESSURE KIT) DEVI 1 Device by Does not apply route as needed. 1 each 0   Misc. Devices (GOJJI WEIGHT SCALE) MISC 1 Device by Does not apply route as needed. 1 each 0   Prenatal Vit-Fe Fumarate-FA (PRENATAL PO) Take by mouth.     acetaminophen (TYLENOL) 325 MG tablet Take 2 tablets (650 mg total) by mouth every 4 (four) hours as needed (for pain  scale < 4). (Patient not taking: Reported on 06/25/2022) 30 tablet 0   No current facility-administered medications on file prior to visit.    No Known Allergies  Social History:  reports that she has quit smoking. She has quit using smokeless tobacco. She reports that she does not currently use alcohol. She reports that she does not currently use drugs after having used the following  drugs: Marijuana.  Family History  Problem Relation Age of Onset   Hypertension Mother    Hyperlipidemia Father    Hypertension Father    Hearing loss Neg Hx     The following portions of the patient's history were reviewed and updated as appropriate: allergies, current medications, past family history, past medical history, past social history, past surgical history and problem list.  Review of Systems Review of Systems  Constitutional:  Negative for chills and fever.  HENT:  Negative for congestion and sore throat.   Eyes:  Negative for pain and visual disturbance.  Respiratory:  Negative for cough, chest tightness and shortness of breath.   Cardiovascular:  Negative for chest pain.  Gastrointestinal:  Negative for abdominal pain, diarrhea, nausea and vomiting.  Endocrine: Negative for cold intolerance and heat intolerance.  Genitourinary:  Negative for dysuria and flank pain.  Musculoskeletal:  Negative for back pain.  Skin:  Negative for rash.  Allergic/Immunologic: Negative for food allergies.  Neurological:  Negative for dizziness and light-headedness.  Psychiatric/Behavioral:  Negative for agitation.       Physical Exam:  BP 105/72   Pulse 87   Wt 223 lb 6.4 oz (101.3 kg)   LMP 05/17/2021 (Approximate)   BMI 37.18 kg/m  CONSTITUTIONAL: Well-developed, well-nourished female in no acute distress.  HENT:  Normocephalic, atraumatic, External right and left ear normal. Oropharynx is clear and moist EYES: Conjunctivae normal. No scleral icterus.  NECK: Normal range of motion, supple, no masses.  Normal thyroid.  SKIN: Skin is warm and dry. No rash noted. Not diaphoretic. No erythema. No pallor. MUSCULOSKELETAL: Normal range of motion. No tenderness.  No cyanosis, clubbing, or edema.   NEUROLOGIC: Alert and oriented to person, place, and time. Normal muscle tone coordination.  PSYCHIATRIC: Normal mood and affect. Normal behavior. Normal judgment and thought  content. CARDIOVASCULAR: Normal heart rate noted, regular rhythm RESPIRATORY: Clear to auscultation bilaterally. Effort and breath sounds normal, no problems with respiration noted. BREASTS: Symmetric in size. No masses, skin changes, nipple drainage, or lymphadenopathy. ABDOMEN: Soft, normal bowel sounds, no distention noted.  No tenderness, rebound or guarding. Fundal ht: NA PELVIC: Deferred FHR: 150   Assessment:    Pregnancy: G3P2002   1. Supervision of other normal pregnancy, antepartum - CBC/D/Plt+RPR+Rh+ABO+RubIgG... - Hemoglobin A1c - Culture, OB Urine - CHL AMB BABYSCRIPTS SCHEDULE OPTIMIZATION - HORIZON CUSTOM - PANORAMA PRENATAL TEST FULL PANEL     Plan:     Initial labs drawn. Prenatal vitamins. Problem list reviewed and updated. Reviewed in detail the nature of the practice with collaborative care between  Genetic screening discussed: NIPS ordered. AFP next visit Role of ultrasound in pregnancy discussed; Anatomy US:  scheduled . Amniocentesis discussed: not indicated. Follow up in 4 weeks. Discussed clinic routines, schedule of care and testing, genetic screening options, involvement of students and residents under the direct supervision of APPs and doctors and presence of female providers. Pt verbalized understanding.  Future Appointments  Date Time Provider [place]  09/18/2022 10:15 AM [facility] NURSE [facility]  09/18/2022 10:30 AM [facility] US3 [facility]  Caren Macadam, MD 08/05/2022 4:06 PM

## 2022-08-06 ENCOUNTER — Encounter: Payer: Self-pay | Admitting: *Deleted

## 2022-08-06 LAB — CBC/D/PLT+RPR+RH+ABO+RUBIGG...
Antibody Screen: NEGATIVE
Basophils Absolute: 0 10*3/uL (ref 0.0–0.2)
Basos: 0 %
EOS (ABSOLUTE): 0.1 10*3/uL (ref 0.0–0.4)
Eos: 2 %
HCV Ab: NONREACTIVE
HIV Screen 4th Generation wRfx: NONREACTIVE
Hematocrit: 37.3 % (ref 34.0–46.6)
Hemoglobin: 13.7 g/dL (ref 11.1–15.9)
Hepatitis B Surface Ag: NEGATIVE
Immature Grans (Abs): 0 10*3/uL (ref 0.0–0.1)
Immature Granulocytes: 0 %
Lymphocytes Absolute: 1.3 10*3/uL (ref 0.7–3.1)
Lymphs: 19 %
MCH: 31.3 pg (ref 26.6–33.0)
MCHC: 36.7 g/dL — ABNORMAL HIGH (ref 31.5–35.7)
MCV: 85 fL (ref 79–97)
Monocytes Absolute: 0.4 10*3/uL (ref 0.1–0.9)
Monocytes: 6 %
Neutrophils Absolute: 4.8 10*3/uL (ref 1.4–7.0)
Neutrophils: 73 %
Platelets: 306 10*3/uL (ref 150–450)
RBC: 4.38 x10E6/uL (ref 3.77–5.28)
RDW: 12 % (ref 11.7–15.4)
RPR Ser Ql: NONREACTIVE
Rh Factor: POSITIVE
Rubella Antibodies, IGG: 2.63 index (ref >0.99)
WBC: 6.7 10*3/uL (ref 3.4–10.8)

## 2022-08-06 LAB — HEMOGLOBIN A1C
Est. average glucose Bld gHb Est-mCnc: 111 mg/dL
Hgb A1c MFr Bld: 5.5 % (ref 4.8–5.6)

## 2022-08-06 LAB — HCV INTERPRETATION

## 2022-08-07 ENCOUNTER — Encounter: Payer: Self-pay | Admitting: Advanced Practice Midwife

## 2022-08-07 NOTE — Addendum Note (Signed)
Addended by: Caryl Bis on: 08/07/2022 12:51 PM   Modules accepted: Orders

## 2022-08-10 ENCOUNTER — Telehealth: Payer: Self-pay

## 2022-08-10 NOTE — Telephone Encounter (Signed)
-----  Message from Caren Macadam, MD sent at 08/07/2022 12:51 PM EST ----- I have put in orders for Venofer x2 infusions. Can you follow up to make sure they get scheduled? Thanks!  Orders placed as hospital admission orders. Will need scheduled at Patient [place] or Short Stay.

## 2022-08-12 NOTE — Telephone Encounter (Signed)
Attempted to call [place]- unable to reach someone.

## 2022-08-13 LAB — PANORAMA PRENATAL TEST FULL PANEL:PANORAMA TEST PLUS 5 ADDITIONAL MICRODELETIONS: FETAL FRACTION: 7.3

## 2022-08-16 LAB — HORIZON CUSTOM: REPORT SUMMARY: POSITIVE — AB

## 2022-08-17 ENCOUNTER — Encounter: Payer: Self-pay | Admitting: Family Medicine

## 2022-08-17 DIAGNOSIS — Z148 Genetic carrier of other disease: Secondary | ICD-10-CM | POA: Insufficient documentation

## 2022-08-17 NOTE — Telephone Encounter (Signed)
Called short stay and patient care center prior to 4:30 PM with no answer. Did not leave VM.

## 2022-08-20 NOTE — Telephone Encounter (Signed)
I called short stay and scheduled for first available appointment of 08/31/22 at 9am. I called Deborah Mahoney and informed her of appointment and to report on that day to [facility] entrance A to admitting. She voices understanding. [name]

## 2022-08-28 ENCOUNTER — Other Ambulatory Visit: Payer: Self-pay

## 2022-08-31 ENCOUNTER — Inpatient Hospital Stay (HOSPITAL_COMMUNITY): Admission: RE | Admit: 2022-08-31 | Payer: Medicaid Other | Source: Ambulatory Visit

## 2022-09-02 ENCOUNTER — Encounter: Payer: Self-pay | Admitting: Obstetrics and Gynecology

## 2022-09-02 ENCOUNTER — Other Ambulatory Visit: Payer: Self-pay

## 2022-09-02 ENCOUNTER — Encounter: Payer: Medicaid Other | Admitting: Obstetrics and Gynecology

## 2022-09-02 ENCOUNTER — Encounter: Payer: Self-pay | Admitting: Advanced Practice Midwife

## 2022-09-02 ENCOUNTER — Ambulatory Visit (INDEPENDENT_AMBULATORY_CARE_PROVIDER_SITE_OTHER): Payer: Medicaid Other | Admitting: Obstetrics and Gynecology

## 2022-09-02 VITALS — BP 113/72 | HR 86 | Wt 234.0 lb

## 2022-09-02 DIAGNOSIS — Z8619 Personal history of other infectious and parasitic diseases: Secondary | ICD-10-CM

## 2022-09-02 DIAGNOSIS — O99210 Obesity complicating pregnancy, unspecified trimester: Secondary | ICD-10-CM | POA: Insufficient documentation

## 2022-09-02 DIAGNOSIS — Z148 Genetic carrier of other disease: Secondary | ICD-10-CM

## 2022-09-02 DIAGNOSIS — O09891 Supervision of other high risk pregnancies, first trimester: Secondary | ICD-10-CM

## 2022-09-02 DIAGNOSIS — Z3480 Encounter for supervision of other normal pregnancy, unspecified trimester: Secondary | ICD-10-CM

## 2022-09-02 DIAGNOSIS — O09292 Supervision of pregnancy with other poor reproductive or obstetric history, second trimester: Secondary | ICD-10-CM

## 2022-09-02 DIAGNOSIS — O09892 Supervision of other high risk pregnancies, second trimester: Secondary | ICD-10-CM

## 2022-09-02 DIAGNOSIS — O99212 Obesity complicating pregnancy, second trimester: Secondary | ICD-10-CM

## 2022-09-02 DIAGNOSIS — Z6839 Body mass index (BMI) 39.0-39.9, adult: Secondary | ICD-10-CM

## 2022-09-02 DIAGNOSIS — Z3A17 17 weeks gestation of pregnancy: Secondary | ICD-10-CM

## 2022-09-02 NOTE — Progress Notes (Signed)
   PRENATAL VISIT NOTE  Subjective:  Deborah Mahoney is a 32 y.o. G3P2002 at 74w1dbeing seen today for ongoing prenatal care.  She is currently monitored for the following issues for this low-risk pregnancy and has Supervision of other normal pregnancy, antepartum; Migraine without aura and with status migrainosus, not intractable; Short interval between pregnancies affecting pregnancy in first trimester, antepartum; Carrier of spinal muscular atrophy; and History of macrosomia in infant in prior pregnancy, currently pregnant in second trimester on their problem list.  Patient reports  cramping after eating mac & cheese, cereal w/ milk .  Contractions: Not present. Vag. Bleeding: None.  Movement: Absent. Denies leaking of fluid.   The following portions of the patient's history were reviewed and updated as appropriate: allergies, current medications, past family history, past medical history, past social history, past surgical history and problem list.   Objective:   Vitals:   09/02/22 1512  BP: 113/72  Pulse: 86  Weight: 234 lb (106.1 kg)    Fetal Status: Fetal Heart Rate (bpm): 145   Movement: Absent     General:  Alert, oriented and cooperative. Patient is in no acute distress.  Skin: Skin is warm and dry. No rash noted.   Cardiovascular: Normal heart rate noted  Respiratory: Normal respiratory effort, no problems with respiration noted  Abdomen: Soft, gravid, appropriate for gestational age.  Pain/Pressure: Absent     Pelvic: Cervical exam deferred        Extremities: Normal range of motion.     Mental Status: Normal mood and affect. Normal behavior. Normal judgment and thought content.   Assessment and Plan:  Pregnancy: G3P2002 at 133w1d. Supervision of other normal pregnancy, antepartum Declines afp. F/u anatomy u/s D/w her re: LI and can try lactaid PRN if she wants to consume any Total weight gain goals d/w her  2. [redacted] weeks gestation of pregnancy  3. Carrier of spinal  muscular atrophy D/w pt. Declined further w/u  4. Short interval between pregnancies affecting pregnancy in first trimester, antepartum  5. History of macrosomia in infant in prior pregnancy, currently pregnant in second trimester Early A1c neg  Preterm labor symptoms and general obstetric precautions including but not limited to vaginal bleeding, contractions, leaking of fluid and fetal movement were reviewed in detail with the patient. Please refer to After Visit Summary for other counseling recommendations.   Return in about 1 month (around 10/01/2022) for in person, md or app, low risk ob.  Future Appointments  Date Time Provider [place]3/02/2023 10:15 AM [facility]/02/2023 10:30 AM [facility] US3 [facility]3/22/2024  9:15 AM ErChancy MilroyMD [facility]  ChAletha HalimMD

## 2022-09-02 NOTE — Progress Notes (Signed)
Pt reports that she's Lactose Intolerance but the pain that she recently had was worse than before.

## 2022-09-07 ENCOUNTER — Encounter (HOSPITAL_COMMUNITY): Payer: Medicaid Other

## 2022-09-18 ENCOUNTER — Other Ambulatory Visit: Payer: Self-pay | Admitting: *Deleted

## 2022-09-18 ENCOUNTER — Encounter: Payer: Self-pay | Admitting: *Deleted

## 2022-09-18 ENCOUNTER — Ambulatory Visit: Payer: Medicaid Other | Admitting: *Deleted

## 2022-09-18 ENCOUNTER — Ambulatory Visit: Payer: Medicaid Other | Attending: Obstetrics and Gynecology

## 2022-09-18 VITALS — BP 109/57 | HR 82

## 2022-09-18 DIAGNOSIS — Z363 Encounter for antenatal screening for malformations: Secondary | ICD-10-CM | POA: Diagnosis not present

## 2022-09-18 DIAGNOSIS — O99212 Obesity complicating pregnancy, second trimester: Secondary | ICD-10-CM

## 2022-09-18 DIAGNOSIS — Z3A19 19 weeks gestation of pregnancy: Secondary | ICD-10-CM | POA: Insufficient documentation

## 2022-09-18 DIAGNOSIS — Z3480 Encounter for supervision of other normal pregnancy, unspecified trimester: Secondary | ICD-10-CM | POA: Diagnosis not present

## 2022-09-18 DIAGNOSIS — O321XX0 Maternal care for breech presentation, not applicable or unspecified: Secondary | ICD-10-CM | POA: Insufficient documentation

## 2022-09-18 DIAGNOSIS — O09292 Supervision of pregnancy with other poor reproductive or obstetric history, second trimester: Secondary | ICD-10-CM | POA: Diagnosis not present

## 2022-09-18 DIAGNOSIS — O3662X0 Maternal care for excessive fetal growth, second trimester, not applicable or unspecified: Secondary | ICD-10-CM | POA: Insufficient documentation

## 2022-09-18 DIAGNOSIS — O09899 Supervision of other high risk pregnancies, unspecified trimester: Secondary | ICD-10-CM

## 2022-09-22 ENCOUNTER — Ambulatory Visit (INDEPENDENT_AMBULATORY_CARE_PROVIDER_SITE_OTHER): Payer: Medicaid Other

## 2022-09-22 ENCOUNTER — Other Ambulatory Visit (HOSPITAL_COMMUNITY)
Admission: RE | Admit: 2022-09-22 | Discharge: 2022-09-22 | Disposition: A | Discharge status: Home / Self Care | Payer: Medicaid Other | Source: Ambulatory Visit | Attending: Obstetrics and Gynecology | Admitting: Obstetrics and Gynecology

## 2022-09-22 ENCOUNTER — Other Ambulatory Visit: Payer: Self-pay

## 2022-09-22 VITALS — BP 111/63 | HR 87 | Wt 233.0 lb

## 2022-09-22 DIAGNOSIS — N898 Other specified noninflammatory disorders of vagina: Secondary | ICD-10-CM | POA: Insufficient documentation

## 2022-09-22 NOTE — Progress Notes (Signed)
Pt here today for self swab for c/o white,thin discharge with itchiness and no odor.  Pt explained that how to obtain self swab and that we will call with abnormal results.  Pt verbalized understanding with no further questions.   Deborah Mahoney  09/22/22

## 2022-09-23 LAB — CERVICOVAGINAL ANCILLARY ONLY
Bacterial Vaginitis (gardnerella): NEGATIVE
Candida Glabrata: NEGATIVE
Candida Vaginitis: POSITIVE — AB
Chlamydia: NEGATIVE
Comment: NEGATIVE
Comment: NEGATIVE
Comment: NEGATIVE
Comment: NEGATIVE
Comment: NEGATIVE
Comment: NORMAL
Neisseria Gonorrhea: NEGATIVE
Trichomonas: NEGATIVE

## 2022-09-28 ENCOUNTER — Other Ambulatory Visit: Payer: Self-pay | Admitting: Advanced Practice Midwife

## 2022-09-28 DIAGNOSIS — B3731 Acute candidiasis of vulva and vagina: Secondary | ICD-10-CM

## 2022-09-28 MED ORDER — TERCONAZOLE 0.4 % VA CREA: 1.0000 | TOPICAL_CREAM | Freq: Every day | VAGINAL | 1 refills | Status: DC

## 2022-10-02 ENCOUNTER — Other Ambulatory Visit: Payer: Self-pay

## 2022-10-02 ENCOUNTER — Ambulatory Visit (INDEPENDENT_AMBULATORY_CARE_PROVIDER_SITE_OTHER): Payer: Medicaid Other | Admitting: Obstetrics and Gynecology

## 2022-10-02 ENCOUNTER — Encounter: Payer: Self-pay | Admitting: Obstetrics and Gynecology

## 2022-10-02 VITALS — BP 107/63 | HR 92 | Wt 233.0 lb

## 2022-10-02 DIAGNOSIS — Z3A21 21 weeks gestation of pregnancy: Secondary | ICD-10-CM

## 2022-10-02 DIAGNOSIS — Z148 Genetic carrier of other disease: Secondary | ICD-10-CM

## 2022-10-02 DIAGNOSIS — O09292 Supervision of pregnancy with other poor reproductive or obstetric history, second trimester: Secondary | ICD-10-CM

## 2022-10-02 DIAGNOSIS — Z3480 Encounter for supervision of other normal pregnancy, unspecified trimester: Secondary | ICD-10-CM

## 2022-10-02 DIAGNOSIS — Z8619 Personal history of other infectious and parasitic diseases: Secondary | ICD-10-CM

## 2022-10-02 DIAGNOSIS — Z3009 Encounter for other general counseling and advice on contraception: Secondary | ICD-10-CM

## 2022-10-02 NOTE — Patient Instructions (Addendum)
[place], [place]  [facility] [place] [facility] [address] [phone]  [facility] Main: [address] [phone] [place]: [address] [place], [place]  [place] [address], [place]  [facility] [address], [address], [place]: [address], [address] [place] [address]: [place], [address] [place] [id number] [place]: [address] [phone] [place]: [address], [address] [phone]  [facility] [address] [id number]  [facility] [address] [place] [facility] [place], [place]  [facility] [address] [place], [place]  [place], [place]  [facility] [place], [place]  [facility] [address], [place], [place] [phone]  [facility]) [place] @ [place]: [address] [place] @ [place]: [address] [place] [phone] Peds @ [address]: [address] [place], [place] [facility] @ [place]: [place], [place]  [facility] [address], [place], [place]  [facility] [address], [place] [phone]  Private Pediatrician Baltazar Najjar, MD [address] [place], [place] Saddie Benders, MD [address] [place], [place] [address] [place], [place], [address] [place], MD ([facility]) [address] [place] [phone]       Safe Medications in Pregnancy   Acne:  Benzoyl Peroxide  Salicylic Acid   Backache/Headache:  Tylenol: 2 regular strength every 4 hours OR               2 Extra strength every 6 hours   Colds/Coughs/Allergies:  Benadryl (alcohol free) 25 mg every 6 hours as needed  Breath right strips  Claritin  Cepacol throat lozenges  Chloraseptic throat spray  Cold-Eeze- up to three times per day  Cough drops, alcohol free  Flonase (by prescription only)  Guaifenesin  Mucinex  Robitussin DM (plain only, alcohol free)  Saline nasal spray/drops  Sudafed (pseudoephedrine) & Actifed * use only after [redacted] weeks gestation and if you do not have high blood pressure  Tylenol  Vicks Vaporub  Zinc lozenges  Zyrtec   Constipation:  Colace  Ducolax suppositories  Fleet enema  Glycerin suppositories  Metamucil  Milk of magnesia  Miralax  Senokot  Smooth move tea   Diarrhea:  Kaopectate  Imodium A-D   *NO pepto Bismol   Hemorrhoids:  Anusol  Anusol HC  Preparation H  Tucks   Indigestion:  Tums  Maalox  Mylanta  Zantac  Pepcid   Insomnia:  Benadryl (alcohol free) 25mg  every 6 hours as needed  Tylenol PM  Unisom, no Gelcaps   Leg Cramps:  Tums  MagGel   Nausea/Vomiting:  Bonine  Dramamine  Emetrol  Ginger extract  Sea bands  Meclizine  Nausea medication to take during pregnancy:  Unisom (doxylamine succinate 25 mg tablets) Take one tablet daily at bedtime. If symptoms are not adequately controlled, the dose can be increased to a maximum recommended dose of two tablets daily (1/2 tablet in the morning, 1/2 tablet mid-afternoon and one at bedtime).  Vitamin B6 100mg  tablets. Take one tablet twice a day (  up to 200 mg per day).   Skin Rashes:  Aveeno products  Benadryl cream or 25mg  every 6 hours as needed  Calamine Lotion  1% cortisone cream    Yeast infection:  Gyne-lotrimin 7  Monistat 7    **If taking multiple medications, please check labels to avoid duplicating the same active ingredients  **take medication as directed on the label  ** Do not exceed 4000 mg of tylenol in 24 hours  **Do not take medications that contain aspirin or ibuprofen

## 2022-10-02 NOTE — Progress Notes (Signed)
Subjective:  Deborah Mahoney is a 32 y.o. G3P2002 at [redacted]w[redacted]d being seen today for ongoing prenatal care.  She is currently monitored for the following issues for this low-risk pregnancy and has Supervision of other normal pregnancy, antepartum; Migraine without aura and with status migrainosus, not intractable; Short interval between pregnancies affecting pregnancy in first trimester, antepartum; Carrier of spinal muscular atrophy; History of macrosomia in infant in prior pregnancy, currently pregnant in second trimester; BMI 39.0-39.9,adult; Obesity in pregnancy; History of herpes genitalis; and Unwanted fertility on their problem list.  Patient reports no complaints.  Contractions: Not present. Vag. Bleeding: None.  Movement: Present. Denies leaking of fluid.   The following portions of the patient's history were reviewed and updated as appropriate: allergies, current medications, past family history, past medical history, past social history, past surgical history and problem list. Problem list updated.  Objective:   Vitals:   10/02/22 0923  BP: 107/63  Pulse: 92  Weight: 233 lb (105.7 kg)    Fetal Status: Fetal Heart Rate (bpm): 143 Fundal Height: 20 cm Movement: Present     General:  Alert, oriented and cooperative. Patient is in no acute distress.  Skin: Skin is warm and dry. No rash noted.   Cardiovascular: Normal heart rate noted  Respiratory: Normal respiratory effort, no problems with respiration noted  Abdomen: Soft, gravid, appropriate for gestational age. Pain/Pressure: Present     Pelvic:  Cervical exam deferred        Extremities: Normal range of motion.     Mental Status: Normal mood and affect. Normal behavior. Normal judgment and thought content.   Urinalysis:      Assessment and Plan:  Pregnancy: G3P2002 at [redacted]w[redacted]d  1. Supervision of other normal pregnancy, antepartum Stable Anatomy scan scheduled  2. Carrier of spinal muscular atrophy Stable  3. History of herpes  genitalis HSV suppression at 36 weeks  4. History of macrosomia in infant in prior pregnancy, currently pregnant in second trimester Serial growth scans as per MFM  5. Unwanted fertility BTL papers at 28 weeks  Preterm labor symptoms and general obstetric precautions including but not limited to vaginal bleeding, contractions, leaking of fluid and fetal movement were reviewed in detail with the patient. Please refer to After Visit Summary for other counseling recommendations.  Return in about 4 weeks (around 10/30/2022) for OB visit, face to face, any provider.   Chancy Milroy, MD

## 2022-10-16 ENCOUNTER — Ambulatory Visit: Payer: Medicaid Other | Attending: Obstetrics

## 2022-10-16 ENCOUNTER — Ambulatory Visit: Payer: Medicaid Other | Admitting: *Deleted

## 2022-10-16 VITALS — BP 112/64 | HR 85

## 2022-10-16 DIAGNOSIS — E669 Obesity, unspecified: Secondary | ICD-10-CM

## 2022-10-16 DIAGNOSIS — Z3A23 23 weeks gestation of pregnancy: Secondary | ICD-10-CM

## 2022-10-16 DIAGNOSIS — O09899 Supervision of other high risk pregnancies, unspecified trimester: Secondary | ICD-10-CM | POA: Insufficient documentation

## 2022-10-16 DIAGNOSIS — O99212 Obesity complicating pregnancy, second trimester: Secondary | ICD-10-CM | POA: Insufficient documentation

## 2022-10-16 DIAGNOSIS — Z148 Genetic carrier of other disease: Secondary | ICD-10-CM | POA: Diagnosis not present

## 2022-10-16 DIAGNOSIS — O09292 Supervision of pregnancy with other poor reproductive or obstetric history, second trimester: Secondary | ICD-10-CM

## 2022-10-16 DIAGNOSIS — O283 Abnormal ultrasonic finding on antenatal screening of mother: Secondary | ICD-10-CM | POA: Diagnosis present

## 2022-10-19 ENCOUNTER — Other Ambulatory Visit: Payer: Self-pay | Admitting: *Deleted

## 2022-10-19 DIAGNOSIS — O09299 Supervision of pregnancy with other poor reproductive or obstetric history, unspecified trimester: Secondary | ICD-10-CM

## 2022-10-19 DIAGNOSIS — O283 Abnormal ultrasonic finding on antenatal screening of mother: Secondary | ICD-10-CM

## 2022-10-19 DIAGNOSIS — O09899 Supervision of other high risk pregnancies, unspecified trimester: Secondary | ICD-10-CM

## 2022-10-30 ENCOUNTER — Other Ambulatory Visit: Payer: Self-pay

## 2022-10-30 ENCOUNTER — Ambulatory Visit (INDEPENDENT_AMBULATORY_CARE_PROVIDER_SITE_OTHER): Payer: Medicaid Other | Admitting: Certified Nurse Midwife

## 2022-10-30 VITALS — BP 128/73 | HR 97 | Wt 238.0 lb

## 2022-10-30 DIAGNOSIS — O09292 Supervision of pregnancy with other poor reproductive or obstetric history, second trimester: Secondary | ICD-10-CM

## 2022-10-30 DIAGNOSIS — Z3492 Encounter for supervision of normal pregnancy, unspecified, second trimester: Secondary | ICD-10-CM

## 2022-10-30 DIAGNOSIS — Z8619 Personal history of other infectious and parasitic diseases: Secondary | ICD-10-CM

## 2022-10-30 DIAGNOSIS — Z3A25 25 weeks gestation of pregnancy: Secondary | ICD-10-CM

## 2022-11-02 DIAGNOSIS — Z8619 Personal history of other infectious and parasitic diseases: Secondary | ICD-10-CM | POA: Insufficient documentation

## 2022-11-02 NOTE — Progress Notes (Signed)
   PRENATAL VISIT NOTE  Subjective:  Deborah Mahoney is a 32 y.o. G3P2002 at [redacted]w[redacted]d being seen today for ongoing prenatal care.  She is currently monitored for the following issues for this low-risk pregnancy and has Supervision of other normal pregnancy, antepartum; Migraine without aura and with status migrainosus, not intractable; Short interval between pregnancies affecting pregnancy in first trimester, antepartum; Carrier of spinal muscular atrophy; History of macrosomia in infant in prior pregnancy, currently pregnant in second trimester; BMI 39.0-39.9,adult; Obesity in pregnancy; History of herpes genitalis; and Unwanted fertility on their problem list.  Patient reports no complaints.  Contractions: Not present.  .  Movement: Present. Denies leaking of fluid.   The following portions of the patient's history were reviewed and updated as appropriate: allergies, current medications, past family history, past medical history, past social history, past surgical history and problem list.   Objective:   Vitals:   10/30/22 1115  BP: 128/73  Pulse: 97  Weight: 238 lb (108 kg)    Fetal Status: Fetal Heart Rate (bpm): 141 Fundal Height: 25 cm Movement: Present     General:  Alert, oriented and cooperative. Patient is in no acute distress.  Skin: Skin is warm and dry. No rash noted.   Cardiovascular: Normal heart rate noted  Respiratory: Normal respiratory effort, no problems with respiration noted  Abdomen: Soft, gravid, appropriate for gestational age.  Pain/Pressure: Present     Pelvic: Cervical exam deferred        Extremities: Normal range of motion.  Edema: None  Mental Status: Normal mood and affect. Normal behavior. Normal judgment and thought content.   Assessment and Plan:  Pregnancy: G3P2002 at [redacted]w[redacted]d 1. Encounter for supervision of low-risk pregnancy in second trimester - Patient ding well.  - Patient reports frequent and vigorous fetal movement.   2. [redacted] weeks gestation of  pregnancy - GTT at next visit.  - Reviewed dietary restrictions for the night before the visit.  - Patient verbalizes understanding.   3. History of herpes genitalis - Patient reports that she was seen in the ED a few years ago for cold sore of the lip. She was Rx'd valtrex and states "it did not work so she stopped taking it." She denies ever having sores to the genitalia and reports she would like it removed from her chart. - CNM reviewed difference in the 2 viruses and how cautious we are to decrease transmission. Patient verbalized understanding.  4. History of macrosomia in infant in prior pregnancy, currently pregnant in second trimester - Last Baby >9lbs.  - Fundal height appropriate for gestational age at todays visit.   Preterm labor symptoms and general obstetric precautions including but not limited to vaginal bleeding, contractions, leaking of fluid and fetal movement were reviewed in detail with the patient. Please refer to After Visit Summary for other counseling recommendations.   Return in about 4 weeks (around 11/27/2022) for LOB w GTT.  Future Appointments  Date Time Provider Department Center  11/27/2022  8:20 AM [facility]  11/27/2022  9:15 AM Sue Lush, FNP [facility]  12/21/2022 10:30 AM [facility] NURSE [facility]  12/21/2022 10:45 AM [facility] US5 [facility]   Ledarrius Beauchaine Danella Deis) Suzie Portela, MSN, CNM  Center for [facility]  11/02/22 3:27 AM

## 2022-11-27 ENCOUNTER — Other Ambulatory Visit: Payer: Self-pay

## 2022-11-27 ENCOUNTER — Ambulatory Visit (INDEPENDENT_AMBULATORY_CARE_PROVIDER_SITE_OTHER): Payer: Medicaid Other | Admitting: Obstetrics and Gynecology

## 2022-11-27 ENCOUNTER — Encounter: Payer: Self-pay | Admitting: Obstetrics and Gynecology

## 2022-11-27 ENCOUNTER — Other Ambulatory Visit: Payer: Medicaid Other

## 2022-11-27 VITALS — BP 114/77 | HR 87 | Wt 237.5 lb

## 2022-11-27 DIAGNOSIS — O26893 Other specified pregnancy related conditions, third trimester: Secondary | ICD-10-CM

## 2022-11-27 DIAGNOSIS — Z3493 Encounter for supervision of normal pregnancy, unspecified, third trimester: Secondary | ICD-10-CM

## 2022-11-27 DIAGNOSIS — O26899 Other specified pregnancy related conditions, unspecified trimester: Secondary | ICD-10-CM

## 2022-11-27 DIAGNOSIS — Z3480 Encounter for supervision of other normal pregnancy, unspecified trimester: Secondary | ICD-10-CM

## 2022-11-27 DIAGNOSIS — Z3A29 29 weeks gestation of pregnancy: Secondary | ICD-10-CM

## 2022-11-27 DIAGNOSIS — O09292 Supervision of pregnancy with other poor reproductive or obstetric history, second trimester: Secondary | ICD-10-CM

## 2022-11-27 DIAGNOSIS — R102 Pelvic and perineal pain: Secondary | ICD-10-CM

## 2022-11-27 DIAGNOSIS — Z23 Encounter for immunization: Secondary | ICD-10-CM | POA: Diagnosis not present

## 2022-11-27 DIAGNOSIS — O09293 Supervision of pregnancy with other poor reproductive or obstetric history, third trimester: Secondary | ICD-10-CM

## 2022-11-27 DIAGNOSIS — Z3009 Encounter for other general counseling and advice on contraception: Secondary | ICD-10-CM

## 2022-11-27 DIAGNOSIS — Z8619 Personal history of other infectious and parasitic diseases: Secondary | ICD-10-CM

## 2022-11-27 DIAGNOSIS — O24419 Gestational diabetes mellitus in pregnancy, unspecified control: Secondary | ICD-10-CM

## 2022-11-27 NOTE — Progress Notes (Addendum)
   PRENATAL VISIT NOTE  Subjective:  Deborah Mahoney is a 32 y.o. G3P2002 at [redacted]w[redacted]d being seen today for ongoing prenatal care.  She is currently monitored for the following issues for this low-risk pregnancy and has Supervision of other normal pregnancy, antepartum; Migraine without aura and with status migrainosus, not intractable; Short interval between pregnancies affecting pregnancy in first trimester, antepartum; Carrier of spinal muscular atrophy; History of macrosomia in infant in prior pregnancy, currently pregnant in second trimester; BMI 39.0-39.9,adult; Obesity in pregnancy; and Unwanted fertility on their problem list.  Patient reports  pelvic pain at night, or with position changes Has been taking one tylenol at night to help .  Contractions: Not present. Vag. Bleeding: None.  Movement: Present. Denies leaking of fluid.   The following portions of the patient's history were reviewed and updated as appropriate: allergies, current medications, past family history, past medical history, past social history, past surgical history and problem list.   Objective:   Vitals:   11/27/22 0844  BP: 114/77  Pulse: 87  Weight: 237 lb 8 oz (107.7 kg)    Fetal Status: Fetal Heart Rate (bpm): 144 Fundal Height: 29 cm Movement: Present     General:  Alert, oriented and cooperative. Patient is in no acute distress.  Skin: Skin is warm and dry. No rash noted.   Cardiovascular: Normal heart rate noted  Respiratory: Normal respiratory effort, no problems with respiration noted  Abdomen: Soft, gravid, appropriate for gestational age.  Pain/Pressure: Present     Pelvic: Cervical exam deferred        Extremities: Normal range of motion.  Edema: None  Mental Status: Normal mood and affect. Normal behavior. Normal judgment and thought content.   Assessment and Plan:  Pregnancy: G3P2002 at [redacted]w[redacted]d  1. Encounter for supervision of low-risk pregnancy in third trimester BP and FHR normal FH  appropriate Feeling vigorous movement   2. History of macrosomia in infant in prior pregnancy, currently pregnant in second trimester Serial growths with MFM U/s 4/5 EFW 41% Follow up on 6/10  3. History cold sores Reports she has never has never had genital herpes. When she was seen with cold sores they were never told about HSV and  "the lady put genital herpes on chart with no outbreaks" Discussed in length the two virus and why we want to decrease risk of transmission at delivery. Patient understand. Will remove from problem list, no suppression, plan d/w Dr. Crissie Reese  4. Unwanted fertility Signed BTL papers today, unsure if she will get BTL or husband will get vasectomy. Went ahead and signed papers but discussed that this does not mean she has to get BTL in hospital if she changes her mind.   5. [redacted] weeks gestation of pregnancy GTT and lab work today   6. 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   Preterm labor symptoms and general obstetric precautions including but not limited to vaginal bleeding, contractions, leaking of fluid and fetal movement were reviewed in detail with the patient. Please refer to After Visit Summary for other counseling recommendations.   Future Appointments  Date Time Provider Department Center  12/21/2022  8:55 AM Adam Phenix, MD [facility]  12/21/2022 10:30 AM [facility] NURSE [facility]  12/21/2022 10:45 AM [facility] US5 [facility]      Albertine Grates, FNP

## 2022-11-28 LAB — HIV ANTIBODY (ROUTINE TESTING W REFLEX): HIV Screen 4th Generation wRfx: NONREACTIVE

## 2022-11-28 LAB — RPR: RPR Ser Ql: NONREACTIVE

## 2022-11-28 LAB — CBC
Hematocrit: 30 % — ABNORMAL LOW (ref 34.0–46.6)
Hemoglobin: 9.7 g/dL — ABNORMAL LOW (ref 11.1–15.9)
MCH: 28.6 pg (ref 26.6–33.0)
MCHC: 32.3 g/dL (ref 31.5–35.7)
MCV: 89 fL (ref 79–97)
Platelets: 241 10*3/uL (ref 150–450)
RBC: 3.39 x10E6/uL — ABNORMAL LOW (ref 3.77–5.28)
RDW: 12.6 % (ref 11.7–15.4)
WBC: 8.9 10*3/uL (ref 3.4–10.8)

## 2022-11-28 LAB — GLUCOSE TOLERANCE, 2 HOURS W/ 1HR
Glucose, 1 hour: 104 mg/dL (ref 70–179)
Glucose, 2 hour: 114 mg/dL (ref 70–152)
Glucose, Fasting: 93 mg/dL — ABNORMAL HIGH (ref 70–91)

## 2022-11-29 DIAGNOSIS — O24419 Gestational diabetes mellitus in pregnancy, unspecified control: Secondary | ICD-10-CM | POA: Insufficient documentation

## 2022-12-02 ENCOUNTER — Telehealth: Payer: Self-pay

## 2022-12-02 MED ORDER — GLUCOSE BLOOD VI STRP: ORAL_STRIP | 12 refills | Status: DC

## 2022-12-02 MED ORDER — ACCU-CHEK GUIDE W/DEVICE KIT: 1.0000 | PACK | Freq: Four times a day (QID) | 0 refills | Status: DC

## 2022-12-02 MED ORDER — ACCU-CHEK SOFTCLIX LANCETS MISC: 12 refills | Status: DC

## 2022-12-02 NOTE — Telephone Encounter (Signed)
Call placed to pt. Spoke with pt. Pt given results per Albertine Grates, NP. Pt verbalized understanding. Diabetes supplies sent to pharmacy.  Diabetes ED appt scheduled for 6/6 at 915am. Pt verbalized understanding.  Judeth Cornfield, RNC

## 2022-12-02 NOTE — Telephone Encounter (Signed)
-----   Message from Sue Lush, FNP sent at 11/29/2022  3:30 PM EDT ----- 28 wk labs show GDM, please send testing supplies and coordinate DM educator visit, notified in my chart

## 2022-12-17 ENCOUNTER — Other Ambulatory Visit: Payer: Medicaid Other

## 2022-12-21 ENCOUNTER — Ambulatory Visit: Payer: Medicaid Other | Admitting: *Deleted

## 2022-12-21 ENCOUNTER — Ambulatory Visit (INDEPENDENT_AMBULATORY_CARE_PROVIDER_SITE_OTHER): Payer: Medicaid Other | Admitting: Obstetrics & Gynecology

## 2022-12-21 ENCOUNTER — Other Ambulatory Visit: Payer: Self-pay | Admitting: *Deleted

## 2022-12-21 ENCOUNTER — Ambulatory Visit: Payer: Medicaid Other | Attending: Obstetrics and Gynecology

## 2022-12-21 VITALS — BP 109/64 | HR 93 | Wt 236.0 lb

## 2022-12-21 VITALS — BP 108/60 | HR 69

## 2022-12-21 DIAGNOSIS — Z3A32 32 weeks gestation of pregnancy: Secondary | ICD-10-CM | POA: Diagnosis not present

## 2022-12-21 DIAGNOSIS — Z148 Genetic carrier of other disease: Secondary | ICD-10-CM

## 2022-12-21 DIAGNOSIS — Z3009 Encounter for other general counseling and advice on contraception: Secondary | ICD-10-CM

## 2022-12-21 DIAGNOSIS — O09899 Supervision of other high risk pregnancies, unspecified trimester: Secondary | ICD-10-CM | POA: Diagnosis present

## 2022-12-21 DIAGNOSIS — Z3689 Encounter for other specified antenatal screening: Secondary | ICD-10-CM

## 2022-12-21 DIAGNOSIS — Z3480 Encounter for supervision of other normal pregnancy, unspecified trimester: Secondary | ICD-10-CM

## 2022-12-21 DIAGNOSIS — O283 Abnormal ultrasonic finding on antenatal screening of mother: Secondary | ICD-10-CM | POA: Diagnosis present

## 2022-12-21 DIAGNOSIS — O09299 Supervision of pregnancy with other poor reproductive or obstetric history, unspecified trimester: Secondary | ICD-10-CM | POA: Diagnosis present

## 2022-12-21 DIAGNOSIS — O09293 Supervision of pregnancy with other poor reproductive or obstetric history, third trimester: Secondary | ICD-10-CM

## 2022-12-21 DIAGNOSIS — O24410 Gestational diabetes mellitus in pregnancy, diet controlled: Secondary | ICD-10-CM

## 2022-12-21 DIAGNOSIS — O285 Abnormal chromosomal and genetic finding on antenatal screening of mother: Secondary | ICD-10-CM

## 2022-12-21 DIAGNOSIS — E669 Obesity, unspecified: Secondary | ICD-10-CM

## 2022-12-21 DIAGNOSIS — O24419 Gestational diabetes mellitus in pregnancy, unspecified control: Secondary | ICD-10-CM

## 2022-12-21 DIAGNOSIS — O99213 Obesity complicating pregnancy, third trimester: Secondary | ICD-10-CM

## 2022-12-21 DIAGNOSIS — O09292 Supervision of pregnancy with other poor reproductive or obstetric history, second trimester: Secondary | ICD-10-CM

## 2022-12-21 MED ORDER — GLUCOSE BLOOD VI STRP: ORAL_STRIP | 12 refills | Status: DC

## 2022-12-21 NOTE — Progress Notes (Unsigned)
   PRENATAL VISIT NOTE  Subjective:  Deborah Mahoney is a 32 y.o. G3P2002 at [redacted]w[redacted]d being seen today for ongoing prenatal care.  She is currently monitored for the following issues for this high-risk pregnancy and has Supervision of other normal pregnancy, antepartum; Migraine without aura and with status migrainosus, not intractable; Short interval between pregnancies affecting pregnancy in first trimester, antepartum; Carrier of spinal muscular atrophy; History of macrosomia in infant in prior pregnancy, currently pregnant in second trimester; BMI 39.0-39.9,adult; Obesity in pregnancy; Unwanted fertility; and Gestational diabetes on their problem list.  Patient reports no complaints.  Contractions: Not present. Vag. Bleeding: None.  Movement: Present. Denies leaking of fluid.   The following portions of the patient's history were reviewed and updated as appropriate: allergies, current medications, past family history, past medical history, past social history, past surgical history and problem list.   Objective:   Vitals:   12/21/22 0945  BP: 109/64  Pulse: 93  Weight: 236 lb (107 kg)    Fetal Status: Fetal Heart Rate (bpm): 147   Movement: Present     General:  Alert, oriented and cooperative. Patient is in no acute distress.  Skin: Skin is warm and dry. No rash noted.   Cardiovascular: Normal heart rate noted  Respiratory: Normal respiratory effort, no problems with respiration noted  Abdomen: Soft, gravid, appropriate for gestational age.  Pain/Pressure: Present     Pelvic: Cervical exam deferred        Extremities: Normal range of motion.  Edema: Trace  Mental Status: Normal mood and affect. Normal behavior. Normal judgment and thought content.   Assessment and Plan:  Pregnancy: G3P2002 at [redacted]w[redacted]d 1. Unwanted fertility - follow up postpartum for possible tubal ligation  2. Gestational diabetes mellitus (GDM) in third trimester, gestational diabetes method of control unspecified -  scheduled 6/13 for DM educator counseling - at home daily glucose check before meals, before bed - GTT 5/17 fasting 93, 1 hour 104, 2 hour 114  3. History of macrosomia in infant in prior pregnancy, currently pregnant in second trimester - serial growths with MFM - U/S 4/5 EFW 41% - FH wnl   4. Supervision of other normal pregnancy, antepartum - follow up in 2 weeks  - continue daily prenatal vitamin  Preterm labor symptoms and general obstetric precautions including but not limited to vaginal bleeding, contractions, leaking of fluid and fetal movement were reviewed in detail with the patient. Please refer to After Visit Summary for other counseling recommendations.   Return in about 2 weeks (around 01/04/2023).  Future Appointments  Date Time Provider Department Center  12/21/2022 10:30 AM [facility] NURSE [facility]  12/21/2022 10:45 AM [facility] US5 [facility]  12/24/2022  1:15 PM [facility]  01/04/2023 11:15 AM Bethel Bing, MD [facility]    Erin Hilbrands, Student-PA Attestation of Attending Supervision of PA Student: Evaluation and management procedures were performed by the PA student under my supervision and collaboration.  I have reviewed the student's note and chart, and I agree with the management and plan.  Scheryl Darter, MD, FACOG Attending Obstetrician & Gynecologist Faculty Practice, [facility]

## 2022-12-23 ENCOUNTER — Telehealth: Payer: Self-pay | Admitting: Family Medicine

## 2022-12-23 NOTE — Telephone Encounter (Signed)
Patient has a yeast infection, would like to take medication for it, she has a medication called FLUCONAZOLE 100mg  tablet and would like to know if it is safe to take before taking.

## 2022-12-24 ENCOUNTER — Other Ambulatory Visit: Payer: Self-pay

## 2022-12-24 ENCOUNTER — Other Ambulatory Visit: Payer: Medicaid Other

## 2022-12-24 MED ORDER — TERCONAZOLE 0.4 % VA CREA: 1.0000 | TOPICAL_CREAM | Freq: Every day | VAGINAL | 0 refills | Status: DC

## 2022-12-24 NOTE — Telephone Encounter (Signed)
Pt concern taken care of in another encounter.   Deborah Mahoney

## 2022-12-24 NOTE — Telephone Encounter (Signed)
Pt states she has a family members Fluconzale to take for a yeast infection and wanted to know if she could take it.  I advised the pt to not take someone else's prescription and that while it is safe to take in pregnancy we recommend Terazol cream as it is more effective treatment in pregnancy.  Pt verbalized understanding with no further questions.   Leonette Nutting  12/24/22

## 2023-01-04 ENCOUNTER — Ambulatory Visit (INDEPENDENT_AMBULATORY_CARE_PROVIDER_SITE_OTHER): Payer: Medicaid Other | Admitting: Obstetrics and Gynecology

## 2023-01-04 VITALS — BP 105/70 | HR 92 | Wt 234.6 lb

## 2023-01-04 DIAGNOSIS — O99213 Obesity complicating pregnancy, third trimester: Secondary | ICD-10-CM

## 2023-01-04 DIAGNOSIS — O24419 Gestational diabetes mellitus in pregnancy, unspecified control: Secondary | ICD-10-CM

## 2023-01-04 DIAGNOSIS — O09293 Supervision of pregnancy with other poor reproductive or obstetric history, third trimester: Secondary | ICD-10-CM

## 2023-01-04 DIAGNOSIS — Z6839 Body mass index (BMI) 39.0-39.9, adult: Secondary | ICD-10-CM

## 2023-01-04 DIAGNOSIS — O09893 Supervision of other high risk pregnancies, third trimester: Secondary | ICD-10-CM

## 2023-01-04 DIAGNOSIS — Z3009 Encounter for other general counseling and advice on contraception: Secondary | ICD-10-CM

## 2023-01-04 DIAGNOSIS — O99210 Obesity complicating pregnancy, unspecified trimester: Secondary | ICD-10-CM

## 2023-01-04 DIAGNOSIS — O09891 Supervision of other high risk pregnancies, first trimester: Secondary | ICD-10-CM

## 2023-01-04 DIAGNOSIS — O09292 Supervision of pregnancy with other poor reproductive or obstetric history, second trimester: Secondary | ICD-10-CM

## 2023-01-04 DIAGNOSIS — Z3A34 34 weeks gestation of pregnancy: Secondary | ICD-10-CM

## 2023-01-04 MED ORDER — GLUCOSE BLOOD VI STRP: ORAL_STRIP | 12 refills | Status: DC

## 2023-01-04 NOTE — Progress Notes (Signed)
   PRENATAL VISIT NOTE  Subjective:  Deborah Mahoney is a 32 y.o. G3P2002 at [redacted]w[redacted]d being seen today for ongoing prenatal care.  She is currently monitored for the following issues for this high-risk pregnancy and has Supervision of other normal pregnancy, antepartum; Migraine without aura and with status migrainosus, not intractable; Short interval between pregnancies affecting pregnancy in first trimester, antepartum; Carrier of spinal muscular atrophy; History of macrosomia in infant in prior pregnancy, currently pregnant in second trimester; BMI 39.0-39.9,adult; Obesity in pregnancy; Unwanted fertility; and Gestational diabetes on their problem list.  Patient reports  some LE edema, bilaterally .  Contractions: Irritability. Vag. Bleeding: None.  Movement: Present. Denies leaking of fluid.   The following portions of the patient's history were reviewed and updated as appropriate: allergies, current medications, past family history, past medical history, past social history, past surgical history and problem list.   Objective:   Vitals:   01/04/23 1149  BP: 105/70  Pulse: 92  Weight: 234 lb 9.6 oz (106.4 kg)    Fetal Status: Fetal Heart Rate (bpm): 134   Movement: Present     General:  Alert, oriented and cooperative. Patient is in no acute distress.  Skin: Skin is warm and dry. No rash noted.   Cardiovascular: Normal heart rate noted  Respiratory: Normal respiratory effort, no problems with respiration noted  Abdomen: Soft, gravid, appropriate for gestational age.  Pain/Pressure: Present     Pelvic: Cervical exam deferred        Extremities: Normal range of motion.  Edema: Trace  Mental Status: Normal mood and affect. Normal behavior. Normal judgment and thought content.   Assessment and Plan:  Pregnancy: G3P2002 at [redacted]w[redacted]d 1. [redacted] weeks gestation of pregnancy TED/compression stockings GBS next visit BTL papers signed 5/17. Pt still deciding. Other options d/w her  2. Gestational  diabetes mellitus (GDM) in third trimester, gestational diabetes method of control unspecified Pt to go by pharmacy to pick up strips; she went previously and they didn't fill it b/c it didn't specify how often to use them Consider setting up IOL next visit, based on how sugars are looking and next u/s 6/10: 69%, 2266gm, ac 95%, afi 12.86  3. BMI 39.0-39.9,adult Weight stable  4. Obesity in pregnancy  5. History of macrosomia in infant in prior pregnancy, currently pregnant in second trimester Baby doesn't feel bigger to patient; see above.   6. Unwanted fertility See above  7. Short interval between pregnancies affecting pregnancy in first trimester, antepartum  Preterm labor symptoms and general obstetric precautions including but not limited to vaginal bleeding, contractions, leaking of fluid and fetal movement were reviewed in detail with the patient. Please refer to After Visit Summary for other counseling recommendations.   No follow-ups on file.  Future Appointments  Date Time Provider Department Center  01/11/2023  2:35 PM Adam Phenix, MD [facility]  01/18/2023 10:15 AM Lavonda Jumbo, DO [facility]  01/22/2023 11:15 AM [facility] NURSE [facility]  01/22/2023 11:30 AM [facility] US3 [facility]  01/25/2023 10:15 AM Warden Fillers, MD [facility]  02/01/2023 11:15 AM Sue Lush, FNP [facility]    [place] Bing, MD

## 2023-01-11 ENCOUNTER — Ambulatory Visit (INDEPENDENT_AMBULATORY_CARE_PROVIDER_SITE_OTHER): Payer: Medicaid Other | Admitting: Obstetrics & Gynecology

## 2023-01-11 ENCOUNTER — Other Ambulatory Visit (HOSPITAL_COMMUNITY)
Admission: RE | Admit: 2023-01-11 | Discharge: 2023-01-11 | Disposition: A | Discharge status: Home / Self Care | Payer: Medicaid Other | Source: Ambulatory Visit | Attending: Obstetrics & Gynecology | Admitting: Obstetrics & Gynecology

## 2023-01-11 ENCOUNTER — Other Ambulatory Visit: Payer: Self-pay

## 2023-01-11 VITALS — BP 112/62 | HR 94 | Wt 233.0 lb

## 2023-01-11 DIAGNOSIS — O99210 Obesity complicating pregnancy, unspecified trimester: Secondary | ICD-10-CM

## 2023-01-11 DIAGNOSIS — M5430 Sciatica, unspecified side: Secondary | ICD-10-CM

## 2023-01-11 DIAGNOSIS — Z3480 Encounter for supervision of other normal pregnancy, unspecified trimester: Secondary | ICD-10-CM

## 2023-01-11 DIAGNOSIS — Z3A36 36 weeks gestation of pregnancy: Secondary | ICD-10-CM | POA: Diagnosis not present

## 2023-01-11 DIAGNOSIS — Z3483 Encounter for supervision of other normal pregnancy, third trimester: Secondary | ICD-10-CM | POA: Insufficient documentation

## 2023-01-11 DIAGNOSIS — Z3493 Encounter for supervision of normal pregnancy, unspecified, third trimester: Secondary | ICD-10-CM | POA: Diagnosis not present

## 2023-01-11 DIAGNOSIS — O99213 Obesity complicating pregnancy, third trimester: Secondary | ICD-10-CM

## 2023-01-11 DIAGNOSIS — O09891 Supervision of other high risk pregnancies, first trimester: Secondary | ICD-10-CM

## 2023-01-11 DIAGNOSIS — O24419 Gestational diabetes mellitus in pregnancy, unspecified control: Secondary | ICD-10-CM

## 2023-01-11 MED ORDER — CYCLOBENZAPRINE HCL 5 MG PO TABS
5.0000 mg | ORAL_TABLET | Freq: Three times a day (TID) | ORAL | 0 refills | Status: DC | PRN
Start: 2023-01-11 — End: 2023-02-01

## 2023-01-11 NOTE — Progress Notes (Signed)
   PRENATAL VISIT NOTE  Subjective:  Deborah Mahoney is a 32 y.o. G3P2002 at [redacted]w[redacted]d being seen today for ongoing prenatal care.  She is currently monitored for the following issues for this high-risk pregnancy and has Supervision of other normal pregnancy, antepartum; Migraine without aura and with status migrainosus, not intractable; Short interval between pregnancies affecting pregnancy in first trimester, antepartum; Carrier of spinal muscular atrophy; History of macrosomia in infant in prior pregnancy, currently pregnant in second trimester; BMI 39.0-39.9,adult; Obesity in pregnancy; Unwanted fertility; and Gestational diabetes on their problem list.  Patient reports  right sciatic pain quite severe .  Contractions: Irregular. Vag. Bleeding: None.  Movement: Present. Denies leaking of fluid.   The following portions of the patient's history were reviewed and updated as appropriate: allergies, current medications, past family history, past medical history, past social history, past surgical history and problem list.   Objective:   Vitals:   01/11/23 1452  BP: 112/62  Pulse: 94  Weight: 233 lb (105.7 kg)    Fetal Status: Fetal Heart Rate (bpm): 145   Movement: Present  Presentation: Vertex  General:  Alert, oriented and cooperative. Patient is in no acute distress.  Skin: Skin is warm and dry. No rash noted.   Cardiovascular: Normal heart rate noted  Respiratory: Normal respiratory effort, no problems with respiration noted  Abdomen: Soft, gravid, appropriate for gestational age.  Pain/Pressure: Present     Pelvic: Cervical exam performed in the presence of a chaperone Dilation: Closed Effacement (%): 30 Station: Ballotable  Extremities: Normal range of motion.     Mental Status: Normal mood and affect. Normal behavior. Normal judgment and thought content.   Assessment and Plan:  Pregnancy: G3P2002 at [redacted]w[redacted]d 1. [redacted] weeks gestation of pregnancy Routine labs - Culture, beta strep (group  b only) - Cervicovaginal ancillary only( [place])  2. Gestational diabetes mellitus (GDM) in third trimester, gestational diabetes method of control unspecified Good control with diet  3. Short interval between pregnancies affecting pregnancy in first trimester, antepartum   4. Supervision of other normal pregnancy, antepartum   5. Obesity in pregnancy   6. Sciatic leg pain  - Ambulatory referral to Physical Therapy - cyclobenzaprine (FLEXERIL) 5 MG tablet; Take 1 tablet (5 mg total) by mouth every 8 (eight) hours as needed for muscle spasms.  Dispense: 30 tablet; Refill: 0  Preterm labor symptoms and general obstetric precautions including but not limited to vaginal bleeding, contractions, leaking of fluid and fetal movement were reviewed in detail with the patient. Please refer to After Visit Summary for other counseling recommendations.   Return in about 1 week (around 01/18/2023).  Future Appointments  Date Time Provider Department Center  01/18/2023 10:15 AM Donna Bernard [facility]  01/22/2023 11:15 AM [facility] NURSE [facility]  01/22/2023 11:30 AM [facility] US3 [facility]  01/25/2023 10:15 AM Warden Fillers, MD [facility]  02/01/2023 11:15 AM Sue Lush, FNP [facility]    Scheryl Darter, MD

## 2023-01-12 LAB — CERVICOVAGINAL ANCILLARY ONLY
Bacterial Vaginitis (gardnerella): NEGATIVE
Candida Glabrata: NEGATIVE
Candida Vaginitis: NEGATIVE
Chlamydia: NEGATIVE
Comment: NEGATIVE
Comment: NEGATIVE
Comment: NEGATIVE
Comment: NEGATIVE
Comment: NEGATIVE
Comment: NORMAL
Neisseria Gonorrhea: NEGATIVE
Trichomonas: NEGATIVE

## 2023-01-14 LAB — CULTURE, BETA STREP (GROUP B ONLY): Strep Gp B Culture: NEGATIVE

## 2023-01-17 NOTE — Progress Notes (Deleted)
   PRENATAL VISIT NOTE  Subjective:  Deborah Mahoney is a 32 y.o. G3P2002 at [redacted]w[redacted]d being seen today for ongoing prenatal care.  She is currently monitored for the following issues for this {Blank single:19197::"high-risk","low-risk"} pregnancy and has Supervision of other normal pregnancy, antepartum; Migraine without aura and with status migrainosus, not intractable; Short interval between pregnancies affecting pregnancy in first trimester, antepartum; Carrier of spinal muscular atrophy; History of macrosomia in infant in prior pregnancy, currently pregnant in second trimester; BMI 39.0-39.9,adult; Obesity in pregnancy; Unwanted fertility; and Gestational diabetes on their problem list.  Patient reports {sx:14538}.   .  .   . Denies leaking of fluid.   The following portions of the patient's history were reviewed and updated as appropriate: allergies, current medications, past family history, past medical history, past social history, past surgical history and problem list.   Objective:  There were no vitals filed for this visit.  Fetal Status:           General:  Alert, oriented and cooperative. Patient is in no acute distress.  Skin: Skin is warm and dry. No rash noted.   Cardiovascular: Normal heart rate noted  Respiratory: Normal respiratory effort, no problems with respiration noted  Abdomen: Soft, gravid, appropriate for gestational age.        Pelvic: {Blank single:19197::"Cervical exam performed in the presence of a chaperone","Cervical exam deferred"}        Extremities: Normal range of motion.     Mental Status: Normal mood and affect. Normal behavior. Normal judgment and thought content.   Assessment and Plan:  Pregnancy: G3P2002 at [redacted]w[redacted]d 1. Supervision of other normal pregnancy, antepartum ***  2. [redacted] weeks gestation of pregnancy ***  3. Unwanted fertility ***  4. Diet controlled gestational diabetes mellitus (GDM) in third trimester ***  {Blank  single:19197::"Term","Preterm"} labor symptoms and general obstetric precautions including but not limited to vaginal bleeding, contractions, leaking of fluid and fetal movement were reviewed in detail with the patient. Please refer to After Visit Summary for other counseling recommendations.   No follow-ups on file.  Future Appointments  Date Time Provider Department Center  01/18/2023 10:15 AM Donna Bernard [facility]  01/22/2023 11:15 AM [facility] NURSE [facility]  01/22/2023 11:30 AM [facility] US3 [facility]  01/25/2023 10:15 AM Warden Fillers, MD [facility]  01/29/2023 11:00 AM Benjie Karvonen April Ma L, PT [facility] None  02/01/2023 11:15 AM Sue Lush, FNP [facility]    Corlette Ciano Autry-Lott, DO

## 2023-01-18 ENCOUNTER — Encounter: Payer: Medicaid Other | Admitting: Family Medicine

## 2023-01-18 DIAGNOSIS — O24410 Gestational diabetes mellitus in pregnancy, diet controlled: Secondary | ICD-10-CM

## 2023-01-18 DIAGNOSIS — Z3A36 36 weeks gestation of pregnancy: Secondary | ICD-10-CM

## 2023-01-18 DIAGNOSIS — Z3480 Encounter for supervision of other normal pregnancy, unspecified trimester: Secondary | ICD-10-CM

## 2023-01-18 DIAGNOSIS — Z3009 Encounter for other general counseling and advice on contraception: Secondary | ICD-10-CM

## 2023-01-22 ENCOUNTER — Ambulatory Visit: Payer: Medicaid Other | Attending: Obstetrics

## 2023-01-22 ENCOUNTER — Ambulatory Visit: Payer: Medicaid Other | Admitting: *Deleted

## 2023-01-22 VITALS — BP 106/65 | HR 84

## 2023-01-22 DIAGNOSIS — E669 Obesity, unspecified: Secondary | ICD-10-CM | POA: Diagnosis not present

## 2023-01-22 DIAGNOSIS — Z3A37 37 weeks gestation of pregnancy: Secondary | ICD-10-CM | POA: Diagnosis not present

## 2023-01-22 DIAGNOSIS — O285 Abnormal chromosomal and genetic finding on antenatal screening of mother: Secondary | ICD-10-CM

## 2023-01-22 DIAGNOSIS — O09293 Supervision of pregnancy with other poor reproductive or obstetric history, third trimester: Secondary | ICD-10-CM

## 2023-01-22 DIAGNOSIS — O24410 Gestational diabetes mellitus in pregnancy, diet controlled: Secondary | ICD-10-CM | POA: Diagnosis not present

## 2023-01-22 DIAGNOSIS — O99213 Obesity complicating pregnancy, third trimester: Secondary | ICD-10-CM

## 2023-01-22 DIAGNOSIS — Z148 Genetic carrier of other disease: Secondary | ICD-10-CM

## 2023-01-25 ENCOUNTER — Encounter: Payer: Medicaid Other | Admitting: Obstetrics and Gynecology

## 2023-01-25 ENCOUNTER — Telehealth: Payer: Self-pay | Admitting: Family Medicine

## 2023-01-25 NOTE — Telephone Encounter (Signed)
Patient called in stating she will like to cancel her appointment today because she do not want to be seen by a female provider. She will come in for her next appointment on 7/22 with Dr. Manson Passey.

## 2023-01-28 ENCOUNTER — Inpatient Hospital Stay (HOSPITAL_COMMUNITY)
Admission: AD | Admit: 2023-01-28 | Discharge: 2023-01-28 | Disposition: A | Discharge status: Home / Self Care | Payer: Medicaid Other | Attending: Obstetrics & Gynecology | Admitting: Obstetrics & Gynecology

## 2023-01-28 DIAGNOSIS — H539 Unspecified visual disturbance: Secondary | ICD-10-CM | POA: Diagnosis not present

## 2023-01-28 DIAGNOSIS — G5601 Carpal tunnel syndrome, right upper limb: Secondary | ICD-10-CM | POA: Insufficient documentation

## 2023-01-28 DIAGNOSIS — Z3A38 38 weeks gestation of pregnancy: Secondary | ICD-10-CM | POA: Insufficient documentation

## 2023-01-28 DIAGNOSIS — O26893 Other specified pregnancy related conditions, third trimester: Secondary | ICD-10-CM | POA: Diagnosis present

## 2023-01-28 DIAGNOSIS — G5600 Carpal tunnel syndrome, unspecified upper limb: Secondary | ICD-10-CM | POA: Diagnosis not present

## 2023-01-28 DIAGNOSIS — R202 Paresthesia of skin: Secondary | ICD-10-CM | POA: Diagnosis not present

## 2023-01-28 DIAGNOSIS — R200 Anesthesia of skin: Secondary | ICD-10-CM

## 2023-01-28 DIAGNOSIS — O99353 Diseases of the nervous system complicating pregnancy, third trimester: Secondary | ICD-10-CM | POA: Insufficient documentation

## 2023-01-28 NOTE — MAU Provider Note (Signed)
Chief Complaint:  Numbness   Event Date/Time   First Provider Initiated Contact with Patient 01/28/23 1943      HPI: Deborah Mahoney is a 32 y.o. G3P2002 at [redacted]w[redacted]d by early ultrasound who presents to maternity admissions reporting visual disturbances and numbness tingling in her right arm.  She had one episode where she saw spots in her vision, then realized she had not eaten since her waffles this morning. She had some food and the vision changes resolved.  But then, she sat holding her baby in her right arm for a period of time and felt like her arm went to sleep. She switched the baby to the left arm but her right arm took a long time before sensations of tingling and numbness, especially in her middle and ring fingers, resolved. There are no symptoms now in MAU.     HPI  Past Medical History: Past Medical History:  Diagnosis Date   Anemia    Generalized anxiety disorder 01/23/2016   GAD7- 14 PHQ9- 4   Infection    UTI   Migraine without status migrainosus, not intractable 02/17/2017   Seasonal allergies     Past obstetric history: OB History  Gravida Para Term Preterm AB Living  3 2 2  0 0 2  SAB IAB Ectopic Multiple Live Births  0 0 0 0 2    # Outcome Date GA Lbr Len/2nd Weight Sex Type Anes PTL Lv  3 Current           2 Term 02/22/22 [redacted]w[redacted]d 13:06 / 02:55 4140 g F Vag-Spont EPI  LIV  1 Term 11/04/13 [redacted]w[redacted]d 35:02 / 03:18 3685 g F Vag-Spont EPI  LIV    Past Surgical History: Past Surgical History:  Procedure Laterality Date   NO PAST SURGERIES      Family History: Family History  Problem Relation Age of Onset   Hypertension Mother    Hyperlipidemia Father    Hypertension Father    Cancer Maternal Grandmother    Hearing loss Neg Hx    Heart disease Neg Hx    Asthma Neg Hx    Diabetes Neg Hx     Social History: Social History   Tobacco Use   Smoking status: Never   Smokeless tobacco: Never  Vaping Use   Vaping status: Never Used  Substance Use Topics   Alcohol  use: Not Currently    Comment: occ stoppped with pregnancy   Drug use: Not Currently    Types: Marijuana    Comment: stopped when found out pregnant    Allergies:  Allergies  Allergen Reactions   Lactose Intolerance (Gi)     cramping    Meds:  No medications prior to admission.    ROS:  Review of Systems  Constitutional:  Negative for chills, fatigue and fever.  Eyes:  Positive for visual disturbance.  Respiratory:  Negative for shortness of breath.   Cardiovascular:  Negative for chest pain.  Gastrointestinal:  Negative for abdominal pain, nausea and vomiting.  Genitourinary:  Negative for difficulty urinating, dysuria, flank pain, pelvic pain, vaginal bleeding, vaginal discharge and vaginal pain.  Neurological:  Positive for numbness. Negative for dizziness and headaches.  Psychiatric/Behavioral: Negative.       I have reviewed patient's Past Medical Hx, Surgical Hx, Family Hx, Social Hx, medications and allergies.   Physical Exam  Patient Vitals for the past 24 hrs:  BP Temp Pulse Resp Height Weight  01/28/23 1951 108/61 -- 76 -- -- --  01/28/23 1855 (!) 106/55 98.1 F (36.7 C) (!) 102 18 5\' 5"  (1.651 m) 105.2 kg   Constitutional: Well-developed, well-nourished female in no acute distress.  Cardiovascular: normal rate Respiratory: normal effort GI: Abd soft, non-tender, gravid appropriate for gestational age.  MS: Extremities nontender, no edema, normal ROM Neurologic: Alert and oriented x 4.  GU: Neg CVAT.  PELVIC EXAM:deferred    FHT 144 by doppler   Labs: No results found for this or any previous visit (from the past 24 hour(s)). O/Positive/-- (01/24 1602)  Imaging:  MAU Course/MDM: Orders Placed This Encounter  Procedures   Discharge patient    No orders of the defined types were placed in this encounter.    FHT 144 by doppler No NST without obstetric complaints, pt feeling normal fetal movement BP grossly normal, pt with hx carpal  tunnel Likely circulatory initially with right arm "going to sleep" but lingering sensation of numbness tingling most c/w carpal tunnel Symptoms are all resolved without treatment Keep scheduled appt in the office on Monday Return to MAU for signs of labor or emergencies    Assessment: 1. Numbness and tingling in right hand   2. Pregnancy related carpal tunnel syndrome in third trimester   3. Visual disturbance   4. [redacted] weeks gestation of pregnancy     Plan: Discharge home Labor precautions and fetal kick counts  Follow-up Information     [facility] Follow up.   Specialty: Obstetrics and Gynecology Why: As scheduled Contact information: [address] [place] [id number] [phone]        [facility] Follow up.   Specialty: Obstetrics and Gynecology Why: As needed for signs of labor or emergencies Contact information: [address] [place] [address] [phone]               Allergies as of 01/28/2023       Reactions   Lactose Intolerance (gi)    cramping        Medication List     TAKE these medications    Accu-Chek Guide w/Device Kit 1 Device by Does not apply route in the morning, at noon, in the evening, and at bedtime.   Accu-Chek Softclix Lancets lancets Use four times daily as instructed.   Blood Pressure Kit Devi 1 Device by Does not apply route as needed.   Carestart COVID-19 Home Test Kit Generic drug: COVID-19 At Home Antigen Test See admin instructions.   cyclobenzaprine 5 MG tablet Commonly known as: FLEXERIL Take 1 tablet (5 mg total) by mouth every 8 (eight) hours as needed for muscle spasms.   glucose blood test strip Use as instructed   Gojji Weight Scale Misc 1 Device by Does not apply route as needed.   PRENATAL PO Take by mouth.        Sharen Counter Certified  Nurse-Midwife 01/28/2023 8:08 PM

## 2023-01-28 NOTE — Progress Notes (Signed)
Sharen Counter CNM in to see pt and discuss plan of care and d/c plan. Written and verbal d/c instructions given and understanding voiced.

## 2023-01-28 NOTE — Discharge Instructions (Signed)
Reasons to return to MAU at [facility]:  1.  Contractions are  5 minutes apart or less, each last 1 minute, these have been going on for 1-2 hours, and you cannot walk or talk during them 2.  You have a large gush of fluid, or a trickle of fluid that will not stop and you have to wear a pad 3.  You have bleeding that is bright red, heavier than spotting--like menstrual bleeding (spotting can be normal in early labor or after a check of your cervix) 4.  You do not feel the baby moving like he/she normally does  

## 2023-01-28 NOTE — MAU Note (Signed)
.  Deborah Mahoney is a 32 y.o. at [redacted]w[redacted]d here in MAU reporting: earlier this afternoon around 3pm stated she had strted to see some spots. Statee she had not eaten since 7am. She sat down and ate and felt better spots went away. Later this afternoon she was holding her child in her right arm and her arm went numb. She switched arms and the numbness went away after a whiel. ( Longer than she thought it should take). She called the on call nurse and told her to been seen within 4 hours. Pt denies any of those symptoms now. . reports good fetal movement  some back pain but no ctx leaking or bleeding. Has history of elevated b/p with a previous pregnancy LMP:  Onset of complaint: 3pm Pain score:  Vitals:   01/28/23 1855  BP: (!) 106/55  Pulse: (!) 102  Resp: 18  Temp: 98.1 F (36.7 C)     FHT:144 Lab orders placed from triage:

## 2023-01-29 ENCOUNTER — Ambulatory Visit: Payer: Medicaid Other | Attending: Obstetrics & Gynecology | Admitting: Physical Therapy

## 2023-01-29 NOTE — Therapy (Deleted)
OUTPATIENT PHYSICAL THERAPY LOWER EXTREMITY EVALUATION   Patient Name: Deborah Mahoney MRN: [id number] DOB:1991-06-23, 32 y.o., female Today's Date: 01/29/2023  END OF SESSION:   Past Medical History:  Diagnosis Date   Anemia    Generalized anxiety disorder 01/23/2016   GAD7- 14 PHQ9- 4   Infection    UTI   Migraine without status migrainosus, not intractable 02/17/2017   Seasonal allergies    Past Surgical History:  Procedure Laterality Date   NO PAST SURGERIES     Patient Active Problem List   Diagnosis Date Noted   Gestational diabetes 11/29/2022   Unwanted fertility 10/02/2022   History of macrosomia in infant in prior pregnancy, currently pregnant in second trimester 09/02/2022   BMI 39.0-39.9,adult 09/02/2022   Obesity in pregnancy 09/02/2022   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   Short interval between pregnancies affecting pregnancy in first trimester, antepartum 02/21/2022   Migraine without aura and with status migrainosus, not intractable 09/15/2019   Supervision of other normal pregnancy, antepartum 04/27/2013    PCP: Tiffany Kocher, DO  REFERRING PROVIDER: Adam Phenix, MD  REFERRING DIAG: M54.30 (ICD-10-CM) - Sciatic leg pain  THERAPY DIAG:  No diagnosis found.  Rationale for Evaluation and Treatment: Rehabilitation  ONSET DATE: ***  SUBJECTIVE:   SUBJECTIVE STATEMENT: ***  PERTINENT HISTORY: *** PAIN:  Are you having pain? {OPRCPAIN:27236}  PRECAUTIONS: {Therapy precautions:24002}  RED FLAGS: {PT Red Flags:29287}   WEIGHT BEARING RESTRICTIONS: {Yes ***/No:24003}  FALLS:  Has patient fallen in last 6 months? {fallsyesno:27318}  LIVING ENVIRONMENT: Lives with: {OPRC lives with:25569::"lives with their family"} Lives in: {Lives in:25570} Stairs: {opstairs:27293} Has following equipment at home: {Assistive devices:23999}  OCCUPATION: ***  PLOF: {PLOF:24004}  PATIENT GOALS: ***  NEXT MD VISIT: ***  OBJECTIVE:    DIAGNOSTIC FINDINGS: ***  PATIENT SURVEYS:  {rehab surveys:24030}  COGNITION: Overall cognitive status: {cognition:24006}     SENSATION: {sensation:27233}  EDEMA:  {edema:24020}  MUSCLE LENGTH: Hamstrings: Right *** deg; Left *** deg Thomas test: Right *** deg; Left *** deg  POSTURE: {posture:25561}  PALPATION: ***  LOWER EXTREMITY ROM:  {AROM/PROM:27142} ROM Right eval Left eval  Hip flexion    Hip extension    Hip abduction    Hip adduction    Hip internal rotation    Hip external rotation    Knee flexion    Knee extension    Ankle dorsiflexion    Ankle plantarflexion    Ankle inversion    Ankle eversion     (Blank rows = not tested)  LOWER EXTREMITY MMT:  MMT Right eval Left eval  Hip flexion    Hip extension    Hip abduction    Hip adduction    Hip internal rotation    Hip external rotation    Knee flexion    Knee extension    Ankle dorsiflexion    Ankle plantarflexion    Ankle inversion    Ankle eversion     (Blank rows = not tested)  LOWER EXTREMITY SPECIAL TESTS:  {LEspecialtests:26242}  FUNCTIONAL TESTS:  {Functional tests:24029}  GAIT: Distance walked: *** Assistive device utilized: {Assistive devices:23999} Level of assistance: {Levels of assistance:24026} Comments: ***   TODAY'S TREATMENT:  DATE: ***    PATIENT EDUCATION:  Education details: *** Person educated: {Person educated:25204} Education method: {Education Method:25205} Education comprehension: {Education Comprehension:25206}  HOME EXERCISE PROGRAM: ***  ASSESSMENT:  CLINICAL IMPRESSION: Patient is a *** y.o. *** who was seen today for physical therapy evaluation and treatment for ***.   OBJECTIVE IMPAIRMENTS: {opptimpairments:25111}.   ACTIVITY LIMITATIONS: {activitylimitations:27494}  PARTICIPATION LIMITATIONS:  {participationrestrictions:25113}  PERSONAL FACTORS: {Personal factors:25162} are also affecting patient's functional outcome.   REHAB POTENTIAL: {rehabpotential:25112}  CLINICAL DECISION MAKING: {clinical decision making:25114}  EVALUATION COMPLEXITY: {Evaluation complexity:25115}   GOALS: Goals reviewed with patient? {yes/no:20286}  SHORT TERM GOALS: Target date: *** *** Baseline: Goal status: INITIAL  2.  *** Baseline:  Goal status: INITIAL  3.  *** Baseline:  Goal status: INITIAL  4.  *** Baseline:  Goal status: INITIAL  5.  *** Baseline:  Goal status: INITIAL  6.  *** Baseline:  Goal status: INITIAL  LONG TERM GOALS: Target date: ***  *** Baseline:  Goal status: INITIAL  2.  *** Baseline:  Goal status: INITIAL  3.  *** Baseline:  Goal status: INITIAL  4.  *** Baseline:  Goal status: INITIAL  5.  *** Baseline:  Goal status: INITIAL  6.  *** Baseline:  Goal status: INITIAL   PLAN:  PT FREQUENCY: {rehab frequency:25116}  PT DURATION: {rehab duration:25117}  PLANNED INTERVENTIONS: {rehab planned interventions:25118::"Therapeutic exercises","Therapeutic activity","Neuromuscular re-education","Balance training","Gait training","Patient/Family education","Self Care","Joint mobilization"}  PLAN FOR NEXT SESSION: ***    April Ma L , PT 01/29/2023, 7:49 AM

## 2023-02-01 ENCOUNTER — Ambulatory Visit (INDEPENDENT_AMBULATORY_CARE_PROVIDER_SITE_OTHER): Payer: Medicaid Other | Admitting: Obstetrics and Gynecology

## 2023-02-01 ENCOUNTER — Telehealth (HOSPITAL_COMMUNITY): Payer: Self-pay | Admitting: *Deleted

## 2023-02-01 ENCOUNTER — Encounter (HOSPITAL_COMMUNITY): Payer: Self-pay

## 2023-02-01 ENCOUNTER — Other Ambulatory Visit: Payer: Self-pay

## 2023-02-01 VITALS — BP 114/73 | HR 117 | Wt 233.6 lb

## 2023-02-01 DIAGNOSIS — Z3A38 38 weeks gestation of pregnancy: Secondary | ICD-10-CM

## 2023-02-01 DIAGNOSIS — Z3009 Encounter for other general counseling and advice on contraception: Secondary | ICD-10-CM

## 2023-02-01 DIAGNOSIS — Z3480 Encounter for supervision of other normal pregnancy, unspecified trimester: Secondary | ICD-10-CM

## 2023-02-01 DIAGNOSIS — O99213 Obesity complicating pregnancy, third trimester: Secondary | ICD-10-CM

## 2023-02-01 DIAGNOSIS — O09891 Supervision of other high risk pregnancies, first trimester: Secondary | ICD-10-CM

## 2023-02-01 DIAGNOSIS — Z0289 Encounter for other administrative examinations: Secondary | ICD-10-CM

## 2023-02-01 DIAGNOSIS — O24419 Gestational diabetes mellitus in pregnancy, unspecified control: Secondary | ICD-10-CM

## 2023-02-01 DIAGNOSIS — O0993 Supervision of high risk pregnancy, unspecified, third trimester: Secondary | ICD-10-CM

## 2023-02-01 DIAGNOSIS — O99210 Obesity complicating pregnancy, unspecified trimester: Secondary | ICD-10-CM

## 2023-02-01 NOTE — Progress Notes (Signed)
   PRENATAL VISIT NOTE  Subjective:  Deborah Mahoney is a 32 y.o. G3P2002 at [redacted]w[redacted]d being seen today for ongoing prenatal care.  She is currently monitored for the following issues for this high-risk pregnancy and has Supervision of other normal pregnancy, antepartum; Migraine without aura and with status migrainosus, not intractable; Short interval between pregnancies affecting pregnancy in first trimester, antepartum; Carrier of spinal muscular atrophy; History of macrosomia in infant in prior pregnancy, currently pregnant in second trimester; BMI 39.0-39.9,adult; Obesity in pregnancy; Unwanted fertility; and Gestational diabetes on their problem list.  Patient reports no complaints.  Contractions: Not present. Vag. Bleeding: None.  Movement: Present. Denies leaking of fluid.   The following portions of the patient's history were reviewed and updated as appropriate: allergies, current medications, past family history, past medical history, past social history, past surgical history and problem list.   Objective:   Vitals:   02/01/23 1138  BP: 114/73  Pulse: (!) 117  Weight: 233 lb 9.6 oz (106 kg)    Fetal Status: Fetal Heart Rate (bpm): 152   Movement: Present  Presentation: Vertex (confirmed by u/s)  General:  Alert, oriented and cooperative. Patient is in no acute distress.  Skin: Skin is warm and dry. No rash noted.   Cardiovascular: Normal heart rate noted  Respiratory: Normal respiratory effort, no problems with respiration noted  Abdomen: Soft, gravid, appropriate for gestational age.  Pain/Pressure: Absent     Pelvic: Cervical exam performed in the presence of a chaperone Dilation: 1 Effacement (%): Thick Station: Ballotable  Extremities: Normal range of motion.     Mental Status: Normal mood and affect. Normal behavior. Normal judgment and thought content.   Assessment and Plan:  Pregnancy: G3P2002 at [redacted]w[redacted]d 1. Supervision of high risk pregnancy in third trimester BP and FHR  normal  Feeling regular fetal movement   2. Gestational diabetes mellitus (GDM) in third trimester, gestational diabetes method of control unspecified 7/12 ultrasound efw 72%, afi normal No further ultrasounds needed Has not been checking sugars this week, previously diet controlled. Encouraged continued monitoring to assess control   3. Short interval between pregnancies affecting pregnancy in first trimester, antepartum   4. Obesity in pregnancy   5. Unwanted fertility BTl papers signed 5/17, leaning toward depo now discussed depo before d/c, partner get vasectomy   6. [redacted] weeks gestation of pregnancy Schedule IOL today, IOL scheduled for 7/30 MN Desired SVE, 1/th/ball Vertex confirmed u/s  Term labor symptoms and general obstetric precautions including but not limited to vaginal bleeding, contractions, leaking of fluid and fetal movement were reviewed in detail with the patient. Please refer to After Visit Summary for other counseling recommendations.   Return for induction of labor 7/30   Albertine Grates, FNP

## 2023-02-01 NOTE — Telephone Encounter (Signed)
Preadmission screen  

## 2023-02-02 ENCOUNTER — Telehealth (HOSPITAL_COMMUNITY): Payer: Self-pay | Admitting: *Deleted

## 2023-02-02 ENCOUNTER — Encounter (HOSPITAL_COMMUNITY): Payer: Self-pay | Admitting: *Deleted

## 2023-02-02 NOTE — Telephone Encounter (Signed)
Preadmission screen  

## 2023-02-03 ENCOUNTER — Other Ambulatory Visit: Payer: Self-pay | Admitting: Advanced Practice Midwife

## 2023-02-06 ENCOUNTER — Other Ambulatory Visit: Payer: Self-pay | Admitting: Advanced Practice Midwife

## 2023-02-07 IMAGING — US US MFM OB FOLLOW-UP
1 series · 13 of 28 positions shown · non-contrast
Comparison: none

[Series 1: us mfm ob follow-up · 53 acquisitions, 13 frames shown]
[im 2/53]
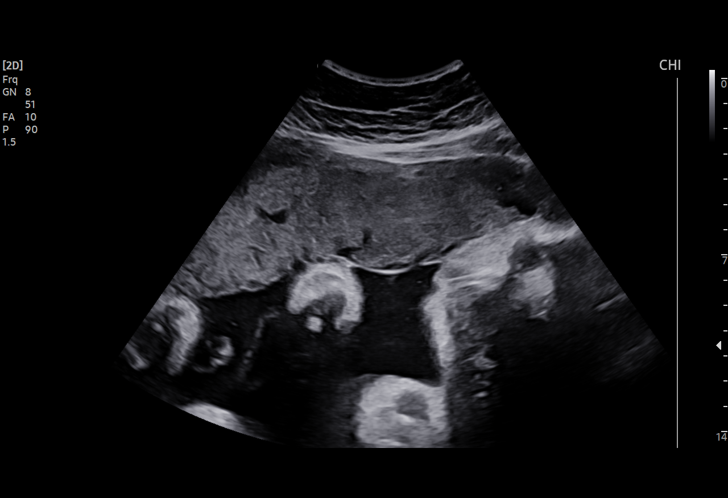
[im 6/53]
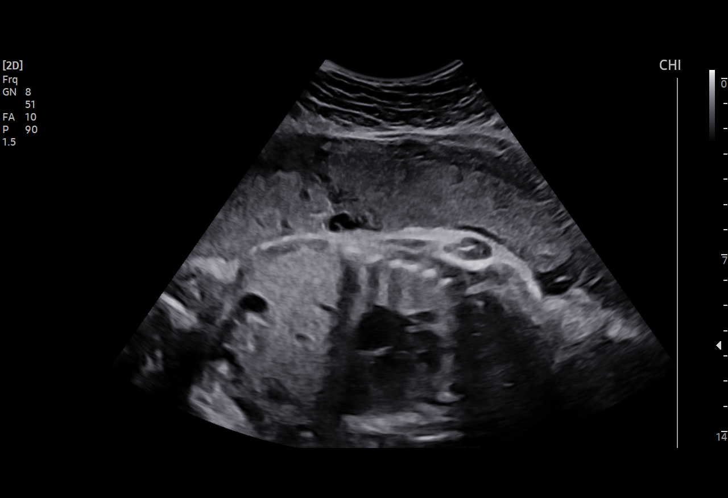
[im 10/53]
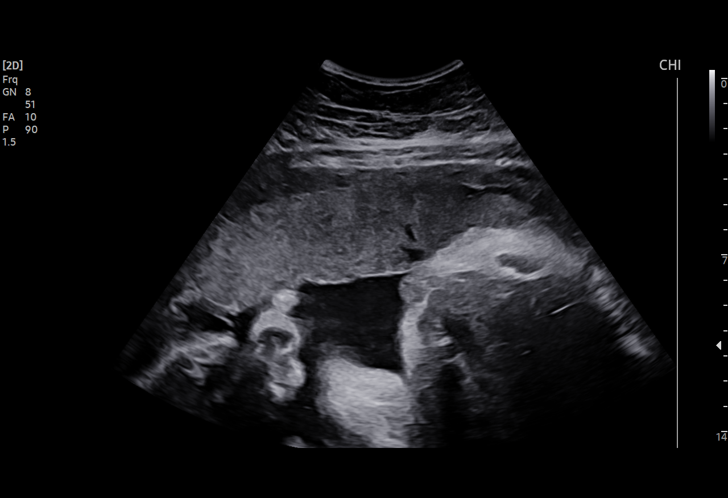
[im 14/53]
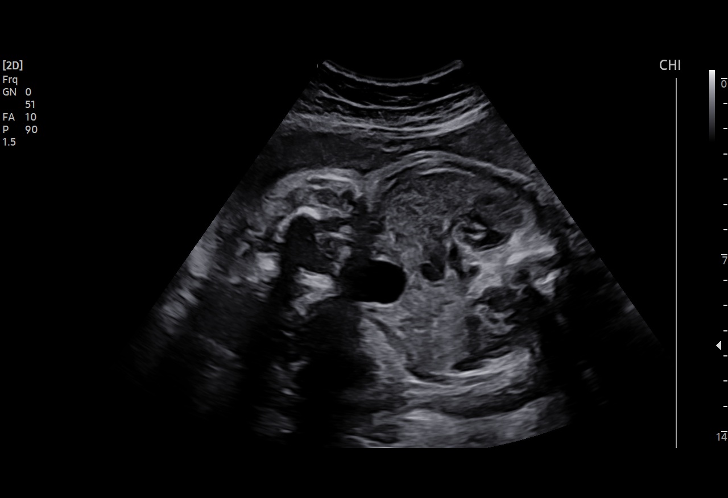
[im 18/53]
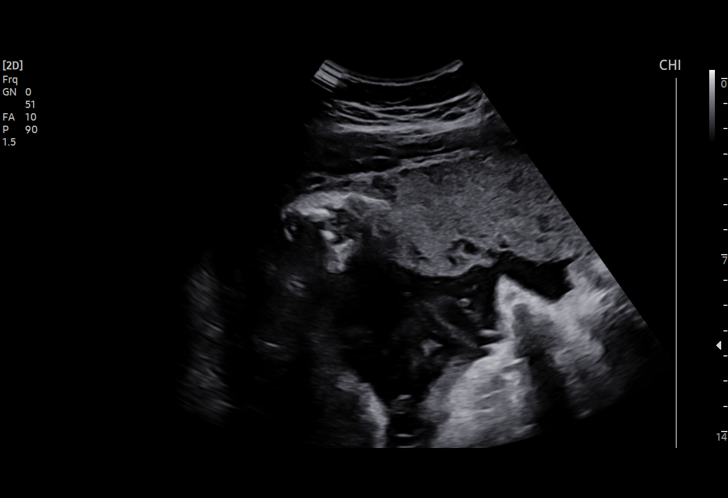
[im 22/53]
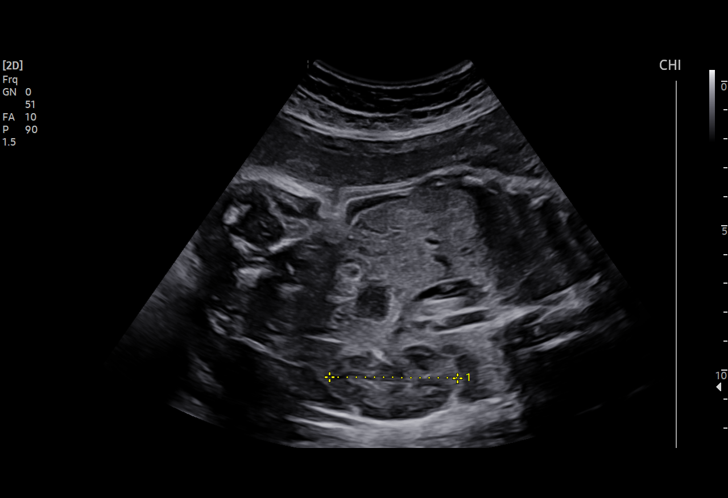
[im 27/53]
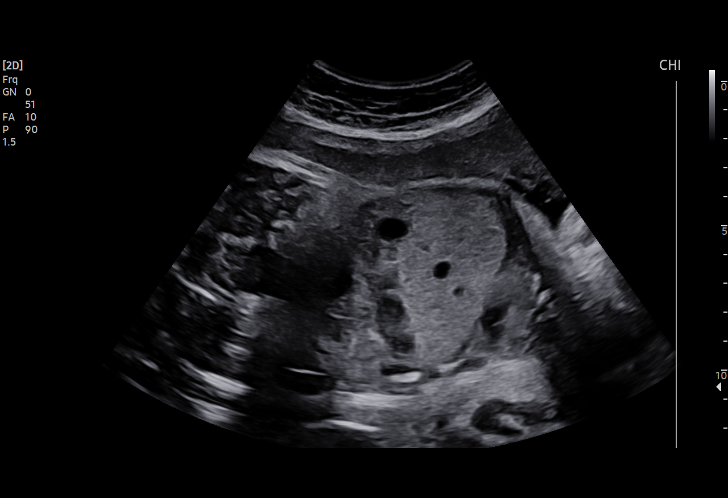
[im 31/53]
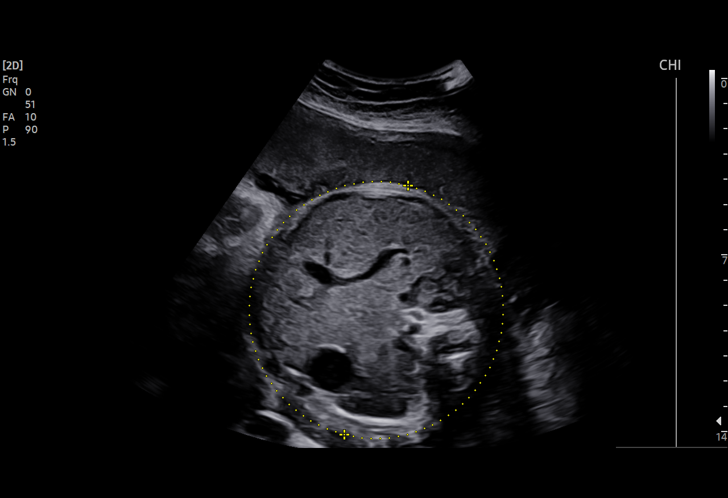
[im 35/53]
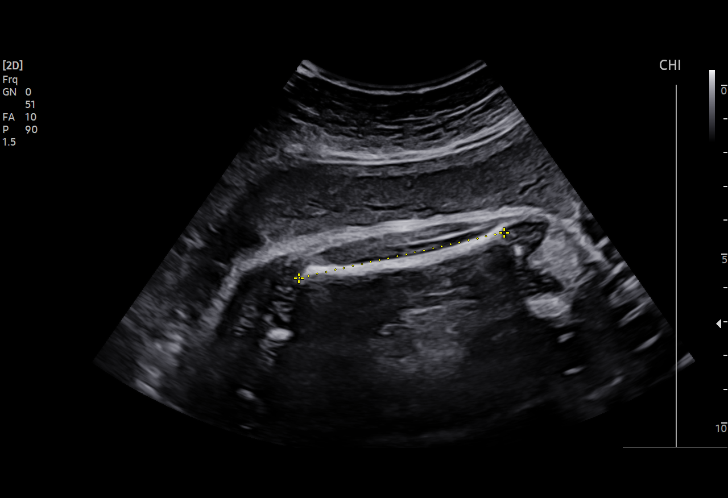
[im 39/53]
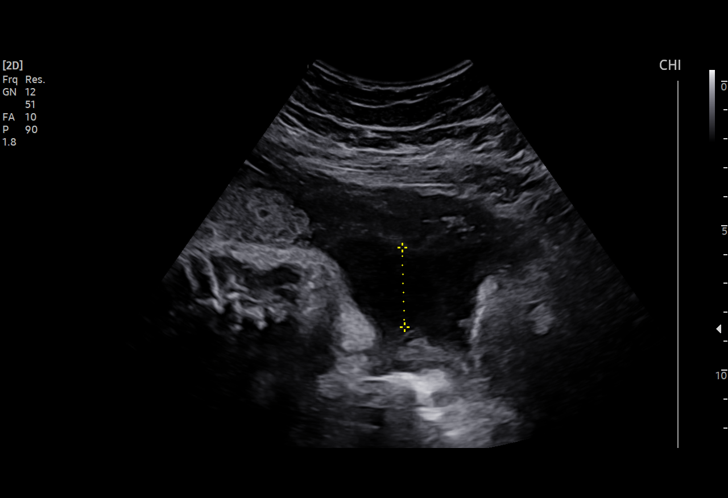
[im 43/53]
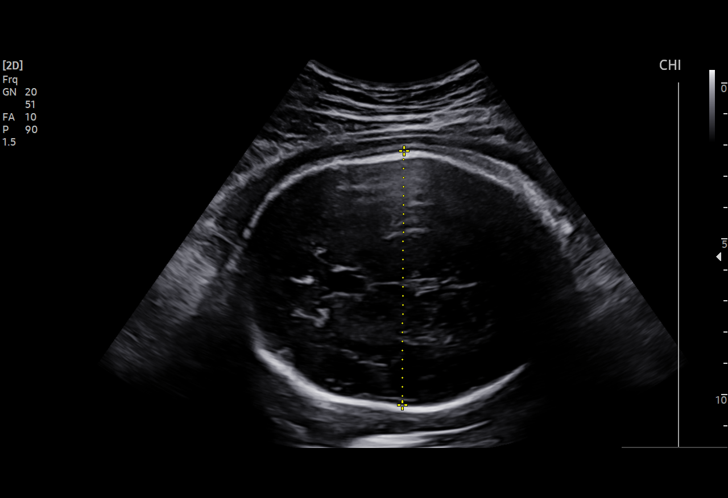
[im 47/53]
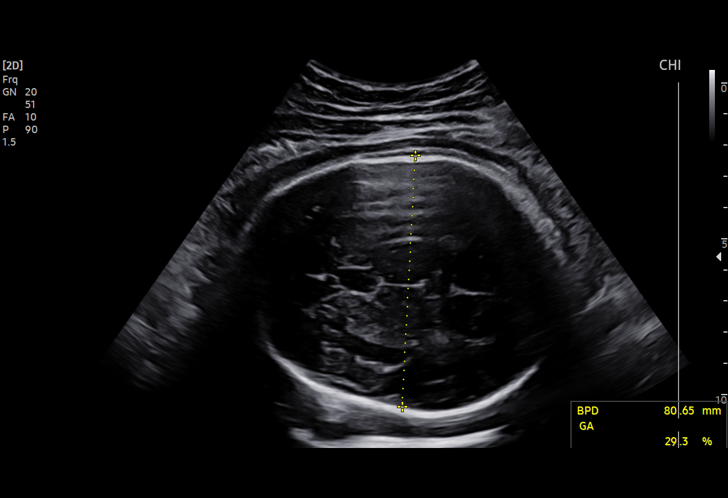
[im 51/53]
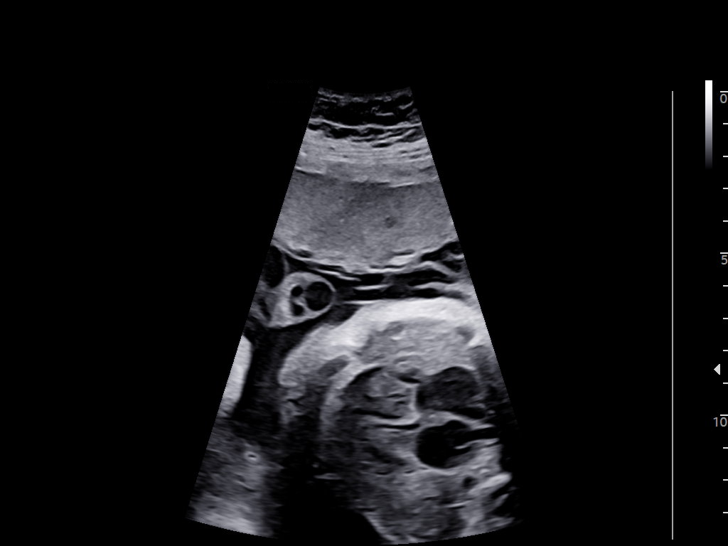

[13 of 28 positions shown; findings below may reference images not displayed]

[facility]icine Center
                                                            [id number]
                                                            [place]

Indications

 Obesity complicating pregnancy, second
 trimester (BMI 37)
 Family history Cystic Fibrosis (Patient
 reports she is a carrier)
 32 weeks gestation of pregnancy
Fetal Evaluation

 Num Of Fetuses:         1
 Fetal Heart Rate(bpm):  148
 Cardiac Activity:       Observed
 Presentation:           Cephalic
 Placenta:               Anterior
 P. Cord Insertion:      Previously Visualized

 Amniotic Fluid
 AFI FV:      Within normal limits

 AFI Sum(cm)     %Tile       Largest Pocket(cm)
 14.26           50

 RUQ(cm)       RLQ(cm)       LUQ(cm)        LLQ(cm)

Biometry
 BPD:     81.13  mm     G. Age:  32w 4d         35  %    CI:        73.71   %    70 - 86
                                                         FL/HC:      20.9   %    19.9 -
 HC:    300.18   mm     G. Age:  33w 2d         24  %    HC/AC:      0.95        0.96 -
 AC:    315.37   mm     G. Age:  35w 3d         98  %    FL/BPD:     77.1   %    71 - 87
 FL:      62.59  mm     G. Age:  32w 3d         26  %    FL/AC:      19.8   %    20 - 24

 Est. FW:    2041  gm      5 lb 3 oz     80  %
OB History

 Gravidity:    2
 Living:       1
Gestational Age

 LMP:           32w 6d        Date:  05/17/21                  EDD:   02/21/22
 U/S Today:     33w 3d                                        EDD:   02/17/22
 Best:          32w 6d     Det. By:  LMP  (05/17/21)          EDD:   02/21/22
Anatomy

 Cranium:               Appears normal         LVOT:                   Previously seen
 Cavum:                 Appears normal         Aortic Arch:            Previously seen
 Ventricles:            Appears normal         Ductal Arch:            Previously seen
 Choroid Plexus:        Previously seen        Diaphragm:              Appears normal
 Cerebellum:            Previously seen        Stomach:                Appears normal, left
                                                                       sided
 Posterior Fossa:       Previously seen        Abdomen:                Appears normal
 Nuchal Fold:           Previously seen        Abdominal Wall:         Previously seen
 Face:                  Orbits and profile     Cord Vessels:           Previously seen
                        previously seen
 Lips:                  Previously seen        Kidneys:                Appear normal
 Palate:                Not well visualized    Bladder:                Appears normal
 Thoracic:              Appears normal         Spine:                  Previously seen
 Heart:                 Previously seen        Upper Extremities:      Previously seen
 RVOT:                  Previously seen        Lower Extremities:      Previously seen

 Other:  Female gender previously seen. Heels/feet and open hands/5th digits
         previously visualized. Technically difficult due to maternal habitus.
Cervix Uterus Adnexa

 Cervix
 Not visualized (advanced GA >97wks)
Impression

 Fetal growth is appropriate for gestational age .Amniotic fluid
 is normal and good fetal activity is seen .
 She does not have gestational diabetes .
Recommendations

 -An appointment was made for her to return in 5 weeks for
 fetal growth assessment (previous baby weighed 8+ lbs)
                Nepal, Alyce

## 2023-02-09 ENCOUNTER — Encounter (HOSPITAL_COMMUNITY): Payer: Self-pay | Admitting: Family Medicine

## 2023-02-09 ENCOUNTER — Inpatient Hospital Stay (HOSPITAL_COMMUNITY): Payer: Medicaid Other | Admitting: Anesthesiology

## 2023-02-09 ENCOUNTER — Inpatient Hospital Stay (HOSPITAL_COMMUNITY)
Admission: RE | Admit: 2023-02-09 | Discharge: 2023-02-11 | DRG: 807 | Disposition: A | Discharge status: Home / Self Care | Payer: Medicaid Other | Attending: Obstetrics and Gynecology | Admitting: Obstetrics and Gynecology

## 2023-02-09 ENCOUNTER — Other Ambulatory Visit: Payer: Self-pay

## 2023-02-09 ENCOUNTER — Inpatient Hospital Stay (HOSPITAL_COMMUNITY): Payer: Medicaid Other

## 2023-02-09 DIAGNOSIS — R0600 Dyspnea, unspecified: Secondary | ICD-10-CM | POA: Diagnosis not present

## 2023-02-09 DIAGNOSIS — O480 Post-term pregnancy: Secondary | ICD-10-CM | POA: Diagnosis not present

## 2023-02-09 DIAGNOSIS — O24419 Gestational diabetes mellitus in pregnancy, unspecified control: Secondary | ICD-10-CM

## 2023-02-09 DIAGNOSIS — O9902 Anemia complicating childbirth: Secondary | ICD-10-CM | POA: Diagnosis not present

## 2023-02-09 DIAGNOSIS — Z3480 Encounter for supervision of other normal pregnancy, unspecified trimester: Secondary | ICD-10-CM

## 2023-02-09 DIAGNOSIS — O24439 Gestational diabetes mellitus in the puerperium, unspecified control: Secondary | ICD-10-CM | POA: Diagnosis present

## 2023-02-09 DIAGNOSIS — O99214 Obesity complicating childbirth: Secondary | ICD-10-CM | POA: Diagnosis present

## 2023-02-09 DIAGNOSIS — O24420 Gestational diabetes mellitus in childbirth, diet controlled: Principal | ICD-10-CM | POA: Diagnosis present

## 2023-02-09 DIAGNOSIS — O9953 Diseases of the respiratory system complicating the puerperium: Secondary | ICD-10-CM | POA: Diagnosis not present

## 2023-02-09 DIAGNOSIS — O09891 Supervision of other high risk pregnancies, first trimester: Secondary | ICD-10-CM

## 2023-02-09 DIAGNOSIS — Z3A40 40 weeks gestation of pregnancy: Secondary | ICD-10-CM | POA: Diagnosis not present

## 2023-02-09 DIAGNOSIS — Z148 Genetic carrier of other disease: Secondary | ICD-10-CM | POA: Diagnosis not present

## 2023-02-09 DIAGNOSIS — O0993 Supervision of high risk pregnancy, unspecified, third trimester: Secondary | ICD-10-CM

## 2023-02-09 DIAGNOSIS — O99012 Anemia complicating pregnancy, second trimester: Secondary | ICD-10-CM

## 2023-02-09 LAB — RPR: RPR Ser Ql: NONREACTIVE

## 2023-02-09 LAB — CBC
HCT: 28.7 % — ABNORMAL LOW (ref 36.0–46.0)
Hemoglobin: 9.1 g/dL — ABNORMAL LOW (ref 12.0–15.0)
MCH: 26.5 pg (ref 26.0–34.0)
MCHC: 31.7 g/dL (ref 30.0–36.0)
MCV: 83.7 fL (ref 80.0–100.0)
Platelets: 199 10*3/uL (ref 150–400)
RBC: 3.43 MIL/uL — ABNORMAL LOW (ref 3.87–5.11)
RDW: 14.6 % (ref 11.5–15.5)
WBC: 8 10*3/uL (ref 4.0–10.5)
nRBC: 0 % (ref 0.0–0.2)

## 2023-02-09 LAB — GLUCOSE, CAPILLARY
Glucose-Capillary: 88 mg/dL (ref 70–99)
Glucose-Capillary: 83 mg/dL (ref 70–99)
Glucose-Capillary: 63 mg/dL — ABNORMAL LOW (ref 70–99)

## 2023-02-09 LAB — TYPE AND SCREEN
ABO/RH(D): O POS
Antibody Screen: NEGATIVE

## 2023-02-09 MED ORDER — TRANEXAMIC ACID-NACL 1000-0.7 MG/100ML-% IV SOLN
1000.0000 mg | Freq: Once | INTRAVENOUS | Status: AC
  Administered 2023-02-09: 1000 mg via INTRAVENOUS
  Filled 2023-02-09: qty 100

## 2023-02-09 MED ORDER — LIDOCAINE HCL (PF) 1 % IJ SOLN: 30.0000 mL | INTRAMUSCULAR | Status: DC | PRN

## 2023-02-09 MED ORDER — LACTATED RINGERS IV SOLN: INTRAVENOUS | Status: DC

## 2023-02-09 MED ORDER — TERBUTALINE SULFATE 1 MG/ML IJ SOLN
0.2500 mg | Freq: Once | INTRAMUSCULAR | Status: AC | PRN
  Administered 2023-02-09: 0.25 mg via SUBCUTANEOUS
  Filled 2023-02-09: qty 1

## 2023-02-09 MED ORDER — OXYCODONE-ACETAMINOPHEN 5-325 MG PO TABS: 2.0000 | ORAL_TABLET | ORAL | Status: DC | PRN

## 2023-02-09 MED ORDER — ACETAMINOPHEN 325 MG PO TABS: 650.0000 mg | ORAL_TABLET | ORAL | Status: DC | PRN

## 2023-02-09 MED ORDER — LIDOCAINE-EPINEPHRINE (PF) 1.5 %-1:200000 IJ SOLN
INTRAMUSCULAR | Status: DC | PRN
  Administered 2023-02-09: 5 mL via PERINEURAL

## 2023-02-09 MED ORDER — LACTATED RINGERS IV SOLN: 500.0000 mL | Freq: Once | INTRAVENOUS | Status: DC

## 2023-02-09 MED ORDER — PHENYLEPHRINE 80 MCG/ML (10ML) SYRINGE FOR IV PUSH (FOR BLOOD PRESSURE SUPPORT)
80.0000 ug | PREFILLED_SYRINGE | INTRAVENOUS | Status: DC | PRN
  Administered 2023-02-09: 80 ug via INTRAVENOUS

## 2023-02-09 MED ORDER — EPHEDRINE 5 MG/ML INJ: 10.0000 mg | INTRAVENOUS | Status: DC | PRN

## 2023-02-09 MED ORDER — ONDANSETRON HCL 4 MG/2ML IJ SOLN: 4.0000 mg | Freq: Four times a day (QID) | INTRAMUSCULAR | Status: DC | PRN

## 2023-02-09 MED ORDER — LACTATED RINGERS AMNIOINFUSION: INTRAVENOUS | Status: DC

## 2023-02-09 MED ORDER — OXYTOCIN BOLUS FROM INFUSION
333.0000 mL | Freq: Once | INTRAVENOUS | Status: AC
  Administered 2023-02-09: 333 mL via INTRAVENOUS

## 2023-02-09 MED ORDER — DIPHENHYDRAMINE HCL 50 MG/ML IJ SOLN: 12.5000 mg | INTRAMUSCULAR | Status: DC | PRN

## 2023-02-09 MED ORDER — FENTANYL-BUPIVACAINE-NACL 0.5-0.125-0.9 MG/250ML-% EP SOLN
12.0000 mL/h | EPIDURAL | Status: DC | PRN
  Administered 2023-02-09: 12 mL/h via EPIDURAL
  Filled 2023-02-09: qty 250

## 2023-02-09 MED ORDER — FENTANYL CITRATE (PF) 100 MCG/2ML IJ SOLN: 50.0000 ug | INTRAMUSCULAR | Status: DC | PRN

## 2023-02-09 MED ORDER — LACTATED RINGERS IV SOLN
500.0000 mL | INTRAVENOUS | Status: DC | PRN
  Administered 2023-02-09: 500 mL via INTRAVENOUS

## 2023-02-09 MED ORDER — OXYCODONE-ACETAMINOPHEN 5-325 MG PO TABS: 1.0000 | ORAL_TABLET | ORAL | Status: DC | PRN

## 2023-02-09 MED ORDER — OXYTOCIN-SODIUM CHLORIDE 30-0.9 UT/500ML-% IV SOLN
1.0000 m[IU]/min | INTRAVENOUS | Status: DC
  Administered 2023-02-09: 2 m[IU]/min via INTRAVENOUS
  Filled 2023-02-09: qty 500

## 2023-02-09 MED ORDER — SOD CITRATE-CITRIC ACID 500-334 MG/5ML PO SOLN: 30.0000 mL | ORAL | Status: DC | PRN

## 2023-02-09 MED ORDER — OXYTOCIN-SODIUM CHLORIDE 30-0.9 UT/500ML-% IV SOLN: 2.5000 [IU]/h | INTRAVENOUS | Status: DC

## 2023-02-09 MED ORDER — PHENYLEPHRINE 80 MCG/ML (10ML) SYRINGE FOR IV PUSH (FOR BLOOD PRESSURE SUPPORT)
80.0000 ug | PREFILLED_SYRINGE | INTRAVENOUS | Status: DC | PRN
  Filled 2023-02-09: qty 10

## 2023-02-09 NOTE — Progress Notes (Signed)
Labor Progress Note Jasmen Devane is a 32 y.o. G3P2002 at [redacted]w[redacted]d presented for IOL 2/2 A1GDM  S: Called to bedside for prolonged late fetal deceleration after drop in maternal BP. S/p phenylephrine  O:  BP 109/64   Pulse 80   Temp 98.3 F (36.8 C) (Oral)   Resp 14   Ht 5\' 5"  (1.651 m)   Wt 105.3 kg   LMP 05/17/2021 (Approximate)   SpO2 97%   BMI 38.64 kg/m  EFM: 88 bpm/Moderate variability/ none accels/ Late decels CAT: 2 Toco: regular, every 2-3 minutes  CVE: 6/70/-3  A&P: 32 y.o. G3P2002 [redacted]w[redacted]d 2/2 A1GDM #Labor: Progressing well. Terb x1, FSE placed, pitocin stopped. Maternal positioned changed. FHT improved to 120s/moderate variability and no decels. #Pain: epidural #FWB: cat 1 after interventions #GBS negative #A1GDM: monitor CBGs q2h #Anemia: Patient starting Hgb is 9.1, plan to have TXA on standby at delivery.   Lahoma Crocker Mercado-Ortiz, DO 4:00 PM

## 2023-02-09 NOTE — H&P (Addendum)
OBSTETRIC ADMISSION HISTORY AND PHYSICAL  Deborah Mahoney is a 32 y.o. female G3P2002 with IUP at 107w0d by [redacted]w[redacted]d [place] presenting for IOL 2/2 A1GDM. She reports +FMs, No LOF, no VB, no blurry vision, headaches or peripheral edema, and RUQ pain.  She plans on bottle feeding. She request BTL for birth control and husband has vasectomy scheduled for September. She received her prenatal care at [facility]   Dating: By [place] --->  Estimated Date of Delivery: 02/09/23  Sono:    @[redacted]w[redacted]d , CWD, normal anatomy, cephalic presentation, anterior placenta, 3345g, 72% EFW   Prenatal History/Complications: A1GDM, anemia (Hb 9.1 on admission), SMA carrier  Past Medical History: Past Medical History:  Diagnosis Date   Anemia    Generalized anxiety disorder 01/23/2016   GAD7- 14 PHQ9- 4   Gestational diabetes    Infection    UTI   Migraine without status migrainosus, not intractable 02/17/2017   Seasonal allergies     Past Surgical History: Past Surgical History:  Procedure Laterality Date   NO PAST SURGERIES      Obstetrical History: OB History     Gravida  3   Para  2   Term  2   Preterm  0   AB  0   Living  2      SAB  0   IAB  0   Ectopic  0   Multiple  0   Live Births  2           Social History Social History   Socioeconomic History   Marital status: Married    Spouse name: Apolinar Junes   Number of children: Not on file   Years of education: Not on file   Highest education level: Not on file  Occupational History   Not on file  Tobacco Use   Smoking status: Never   Smokeless tobacco: Never  Vaping Use   Vaping status: Never Used  Substance and Sexual Activity   Alcohol use: Not Currently    Comment: occ stoppped with pregnancy   Drug use: Not Currently    Types: Marijuana    Comment: stopped when found out pregnant   Sexual activity: Yes    Birth control/protection: None  Other Topics Concern   Not on file  Social History Narrative   Not on file   Social  Determinants of Health   Financial Resource Strain: Not on file  Food Insecurity: No Food Insecurity (02/09/2023)   Hunger Vital Sign    Worried About Running Out of Food in the Last Year: Never true    Ran Out of Food in the Last Year: Never true  Transportation Needs: No Transportation Needs (02/09/2023)   PRAPARE - Administrator, Civil Service (Medical): No    Lack of Transportation (Non-Medical): No  Physical Activity: Not on file  Stress: Not on file  Social Connections: Not on file    Family History: Family History  Problem Relation Age of Onset   Hypertension Mother    Hyperlipidemia Father    Hypertension Father    Cancer Maternal Grandmother    Hearing loss Neg Hx    Heart disease Neg Hx    Asthma Neg Hx    Diabetes Neg Hx     Allergies: Allergies  Allergen Reactions   Lactose Intolerance (Gi)     cramping    Medications Prior to Admission  Medication Sig Dispense Refill Last Dose   Prenatal Vit-Fe Fumarate-FA (PRENATAL PO) Take  by mouth.   02/08/2023   Blood Pressure Monitoring (BLOOD PRESSURE KIT) DEVI 1 Device by Does not apply route as needed. (Patient not taking: Reported on 01/04/2023) 1 each 0      Review of Systems   All systems reviewed and negative except as stated in HPI  Blood pressure 119/72, pulse 82, temperature 98.3 F (36.8 C), temperature source Axillary, resp. rate 18, height 5\' 5"  (1.651 m), weight 105.3 kg, last menstrual period 05/17/2021, unknown if currently breastfeeding. General appearance: alert, cooperative, and no distress Lungs: no respiratory distress Extremities: no BLE swelling, no sign of DVT Presentation: cephalic Fetal monitoringBaseline: 130 bpm, Variability: moderate, Accelerations: 15x15, and Decelerations: Absent Uterine activityFrequency: Every 2-3 minutes Dilation: 2.5 Effacement (%): 50 Station: -3 Exam by:: [name], RNC   Prenatal labs: ABO, Rh: --/--/O POS (07/30 0827) Antibody: NEG (07/30  0827) Rubella: 2.63 (01/24 1602) RPR: Non Reactive (05/17 0832)  HBsAg: Negative (01/24 1602)  HIV: Non Reactive (05/17 0832)  GBS: Negative/-- (07/01 1547)  3rd trimester GTT abnormal Genetic screening  NIPS LR, horizon SMA Anatomy US normal  Prenatal Transfer Tool  Maternal Diabetes: Yes:  Diabetes Type:  Diet controlled Genetic Screening: Normal Maternal Ultrasounds/Referrals: Normal Fetal Ultrasounds or other Referrals:  None Maternal Substance Abuse:  No Significant Maternal Medications:  None Significant Maternal Lab Results:  Group B Strep negative Number of Prenatal Visits:greater than 3 verified prenatal visits Other Comments:  None  Results for orders placed or performed during the hospital encounter of 02/09/23 (from the past 24 hour(s))  Type and screen   Collection Time: 02/09/23  8:27 AM  Result Value Ref Range   ABO/RH(D) O POS    Antibody Screen NEG    Sample Expiration      02/12/2023,2359 Performed at [facility], [address], [place], [place] [address]   CBC   Collection Time: 02/09/23  8:30 AM  Result Value Ref Range   WBC 8.0 4.0 - 10.5 K/uL   RBC 3.43 (L) 3.87 - 5.11 MIL/uL   Hemoglobin 9.1 (L) 12.0 - 15.0 g/dL   HCT 19.1 (L) 47.8 - 29.5 %   MCV 83.7 80.0 - 100.0 fL   MCH 26.5 26.0 - 34.0 pg   MCHC 31.7 30.0 - 36.0 g/dL   RDW 62.1 30.8 - 65.7 %   Platelets 199 150 - 400 K/uL   nRBC 0.0 0.0 - 0.2 %  Glucose, capillary   Collection Time: 02/09/23  9:01 AM  Result Value Ref Range   Glucose-Capillary 88 70 - 99 mg/dL    Patient Active Problem List   Diagnosis Date Noted   Gestational diabetes mellitus, antepartum 02/09/2023   Gestational diabetes 11/29/2022   Unwanted fertility 10/02/2022   History of macrosomia in infant in prior pregnancy, currently pregnant in second trimester 09/02/2022   BMI 39.0-39.9,adult 09/02/2022   Obesity in pregnancy 09/02/2022   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   Short interval between  pregnancies affecting pregnancy in first trimester, antepartum 02/21/2022   Migraine without aura and with status migrainosus, not intractable 09/15/2019   Supervision of other normal pregnancy, antepartum 04/27/2013   Patient Vitals for the past 24 hrs:  BP Temp Temp src Pulse Resp SpO2 Height Weight  02/09/23 1031 108/66 -- -- 71 18 -- -- --  02/09/23 1016 105/70 -- -- 83 16 -- -- --  02/09/23 1010 111/70 -- -- 74 14 97 % -- --  02/09/23 1005 114/71 -- -- 81  14 96 % -- --  02/09/23 1000 110/71 -- -- 79 16 97 % -- --  02/09/23 0956 110/67 -- -- 77 14 -- -- --  02/09/23 0952 121/70 -- -- 81 16 99 % -- --  02/09/23 0945 115/66 -- -- 70 -- 99 % -- --  02/09/23 0807 119/72 98.3 F (36.8 C) Axillary 82 18 -- 5\' 5"  (1.651 m) 232 lb 3.2 oz (105.3 kg)   CBG (last 3)  Recent Labs    02/09/23 0901  GLUCAP 88     Assessment/Plan:  Iresha Mcgillis is a 32 y.o. G3P2002 at [redacted]w[redacted]d here for IOL 2/2 A1GDM  #Labor: Initial CVE 2.5/50/-3. Contracting regularly. Will start low dose pit #Pain: Plan for epidural #FWB: Cat 1 #ID:  Gbs neg #MOF: bottle #MOC:BTL, consent signed  #A1GDM: monitor CBGs q4 in latent labor q2 in active labor   Kirstie Everhart, DO  02/09/2023, 9:51 AM   I confirm that I have verified and agree with the information documented in the resident's note.  As well as made any necessary changes if needed.   - Patient starting Hgb is 9.1, plan to have TXA on standby at delivery.   Carlynn Herald, CNM 02/09/2023 11:23 AM

## 2023-02-09 NOTE — Progress Notes (Signed)
Patient ID: Deborah Mahoney, female   DOB: 1991/04/08, 32 y.o.   MRN: [id number] LABOR NOTE Deborah Mahoney is a 32 y.o. G3P2002 at [redacted]w[redacted]d admitted for induction of labor due to diabetes mellitus A1DM  Subjective: comfortable with epidural and feeling pressure  Objective: BP 107/64   Pulse 70   Temp 98.8 F (37.1 C) (Oral)   Resp (P) 16   Ht 5\' 5"  (1.651 m)   Wt 105.3 kg   LMP 05/17/2021 (Approximate)   SpO2 97%   BMI 38.64 kg/m  No intake/output data recorded.  FHR baseline 120 bpm, Variability: moderate, Accelerations:present, Decelerations:  Present  variables Toco: q 2-4 mins, not tracing well  SVE:   8/80/0 by me IUPC placed w/o difficulty  Labs: Lab Results  Component Value Date   WBC 8.0 02/09/2023   HGB 9.1 (L) 02/09/2023   HCT 28.7 (L) 02/09/2023   MCV 83.7 02/09/2023   PLT 199 02/09/2023  CBG (last 3)  Recent Labs    02/09/23 0901 02/09/23 1433  GLUCAP 88 83     Assessment / Plan: IOL d/t A1DM, pit started @ 0845 and off at 1550 d/t prolonged, was @ 14. AROM 1555 clear fluid, FSE placed at that time. IUPC now placed to monitor contractions and for amnioinfusion d/t variables  Labor: active Fetal Wellbeing:  Category II Pain Control:  epidural Pre-eclampsia: N/A I/D:  GBS neg Anticipated MOD: NSVB  [name] CNM, WHNP-BC 02/09/2023, 7:34 PM

## 2023-02-09 NOTE — Progress Notes (Signed)
Labor Progress Note Deborah Mahoney is a 32 y.o. G3P2002 at [redacted]w[redacted]d presented for IOL 2/2 A1GDM S: Doing well, comfortable  O:  BP 108/67   Pulse 80   Temp 98.2 F (36.8 C) (Oral)   Resp 16   Ht 5\' 5"  (1.651 m)   Wt 105.3 kg   LMP 05/17/2021 (Approximate)   SpO2 97%   BMI 38.64 kg/m  EFM: 130/moderate/15x15 accels/no decels  CVE: Dilation: 3.5 Effacement (%): 60, 70 Station: -3 Presentation: Vertex Exam by:: Deborah Mahoney, RNC   A&P: 32 y.o. [id number] [redacted]w[redacted]d 2/2 A1GDM #Labor: Progressing well. Continue pit #Pain: epidural #FWB: cat 1 #GBS negative  Patient Vitals for the past 24 hrs:  BP Temp Temp src Pulse Resp SpO2 Height Weight  02/09/23 1201 108/67 98.2 F (36.8 C) Oral 80 16 -- -- --  02/09/23 1131 112/66 -- -- 73 14 -- -- --  02/09/23 1101 114/69 -- -- 76 16 -- -- --  02/09/23 1031 108/66 -- -- 71 18 -- -- --  02/09/23 1016 105/70 -- -- 83 16 -- -- --  02/09/23 1010 111/70 -- -- 74 14 97 % -- --  02/09/23 1005 114/71 -- -- 81 14 96 % -- --  02/09/23 1000 110/71 -- -- 79 16 97 % -- --  02/09/23 0956 110/67 -- -- 77 14 -- -- --  02/09/23 0952 121/70 -- -- 81 16 99 % -- --  02/09/23 0945 115/66 -- -- 70 -- 99 % -- --  02/09/23 0807 119/72 98.3 F (36.8 C) Axillary 82 18 -- 5\' 5"  (1.651 m) 105.3 kg   CBG (last 3)  Recent Labs    02/09/23 0901  GLUCAP 88    #A1GDM: monitor CBGs q4 in latent labor q2 in active labor  - Patient starting Hgb is 9.1, plan to have TXA on standby at delivery.   Deborah Mun, DO 12:40 PM

## 2023-02-09 NOTE — Anesthesia Procedure Notes (Signed)
Epidural Patient location during procedure: OB Start time: 02/09/2023 9:45 AM End time: 02/09/2023 9:55 AM  Staffing Anesthesiologist: Mariann Barter, MD Performed: anesthesiologist   Preanesthetic Checklist Completed: patient identified, IV checked, site marked, risks and benefits discussed, surgical consent, monitors and equipment checked, pre-op evaluation and timeout performed  Epidural Patient position: sitting Prep: DuraPrep Patient monitoring: cardiac monitor Approach: midline Location: L4-L5 Injection technique: LOR saline  Needle:  Needle type: Tuohy  Needle gauge: 17 G Needle length: 9 cm Needle insertion depth: 6 cm Catheter size: 19 Gauge Catheter at skin depth: 11 cm Test dose: negative and 1.5% lidocaine with Epi 1:200 K  Assessment Events: blood not aspirated, no cerebrospinal fluid and negative IV test  Additional Notes Reason for block:procedure for pain

## 2023-02-09 NOTE — Anesthesia Preprocedure Evaluation (Addendum)
Anesthesia Evaluation  Patient identified by MRN, date of birth, ID band Patient awake    Reviewed: Allergy & Precautions, NPO status , Patient's Chart, lab work & pertinent test results  History of Anesthesia Complications Negative for: history of anesthetic complications  Airway Mallampati: III  TM Distance: >3 FB Neck ROM: Full    Dental no notable dental hx.    Pulmonary neg pulmonary ROS   Pulmonary exam normal breath sounds clear to auscultation       Cardiovascular negative cardio ROS Normal cardiovascular exam Rhythm:Regular Rate:Normal     Neuro/Psych  Headaches PSYCHIATRIC DISORDERS Anxiety        GI/Hepatic negative GI ROS, Neg liver ROS,,,  Endo/Other  diabetes, Gestational    Renal/GU negative Renal ROS  negative genitourinary   Musculoskeletal negative musculoskeletal ROS (+)    Abdominal   Peds  Hematology  (+) Blood dyscrasia, anemia Lab Results      Component                Value               Date                      WBC                      8.0                 02/09/2023                HGB                      9.1 (L)             02/09/2023                HCT                      28.7 (L)            02/09/2023                MCV                      83.7                02/09/2023                PLT                      199                 02/09/2023              Anesthesia Other Findings   Reproductive/Obstetrics (+) Pregnancy                              Anesthesia Physical Anesthesia Plan  ASA: 3  Anesthesia Plan: Epidural   Post-op Pain Management:    Induction:   PONV Risk Score and Plan:   Airway Management Planned: Natural Airway  Additional Equipment:   Intra-op Plan:   Post-operative Plan:   Informed Consent: I have reviewed the patients History and Physical, chart, labs and discussed the procedure including the risks, benefits and  alternatives for the proposed anesthesia with the patient or authorized representative who has indicated  his/her understanding and acceptance.       Plan Discussed with:   Anesthesia Plan Comments:          Anesthesia Quick Evaluation

## 2023-02-09 NOTE — Discharge Summary (Signed)
Postpartum Discharge Summary     Patient Name: Deborah Mahoney DOB: 09-24-1990 MRN: [id number]  Date of admission: 02/09/2023 Delivery date:02/09/2023 Delivering provider: Shawna Clamp R Date of discharge: 02/11/2023  Admitting diagnosis: Gestational diabetes mellitus, antepartum [O24.419] Intrauterine pregnancy: [redacted]w[redacted]d     Secondary diagnosis:  Principal Problem:   Gestational diabetes mellitus, antepartum Active Problems:   Short interval between pregnancies affecting pregnancy in first trimester, antepartum   Carrier of spinal muscular atrophy  Additional problems: none    Discharge diagnosis: Term Pregnancy Delivered and GDM A1                                              Post partum procedures: Depo prior to d/c   Augmentation: AROM and Pitocin Complications: None  Hospital course: Induction of Labor With Vaginal Delivery   32 y.o. yo G3P3003 at [redacted]w[redacted]d was admitted to the hospital 02/09/2023 for induction of labor.  Indication for induction: A1 DM.  Patient had an labor course complicated by nothing Membrane Rupture Time/Date: 3:55 PM,02/09/2023  Delivery Method:Vaginal, Spontaneous Operative Delivery:N/A Episiotomy: None Lacerations:  None Details of delivery can be found in separate delivery note.  Patient had a postpartum course complicated by some dyspnea on the morning of PPD#1 which resolved by that afternoon with neg PE/cardiac labs. Patient is feeling well and is discharged home 02/11/23.  Newborn Data: Birth date:02/09/2023 Birth time:9:44 PM Gender:Female Living status:Living Apgars:8 ,9  Weight:3600 g(7lb 15oz)  Magnesium Sulfate received: No BMZ received: No Rhophylac:N/A MMR:N/A T-DaP: declined prenatally Flu: N/A Transfusion:No  Physical exam  Vitals:   02/10/23 0526 02/10/23 0850 02/10/23 1300 02/10/23 2118  BP: 102/67 121/85 113/81 117/79  Pulse: 66 71 69 72  Resp: 17 19 16 16   Temp: 98.7 F (37.1 C) 98.6 F (37 C) 97.8 F (36.6 C) 97.8 F  (36.6 C)  TempSrc:  Oral Oral Oral  SpO2:  100%  99%  Weight:      Height:       General: alert and cooperative Lochia: appropriate Uterine Fundus: firm Incision: N/A DVT Evaluation: No evidence of DVT seen on physical exam. Labs: Lab Results  Component Value Date   WBC 10.7 (H) 02/10/2023   HGB 9.3 (L) 02/10/2023   HCT 29.0 (L) 02/10/2023   MCV 85.8 02/10/2023   PLT 194 02/10/2023      Latest Ref Rng & Units 02/10/2023    9:34 AM  CMP  Creatinine 0.44 - 1.00 mg/dL 4.09    Edinburgh Score:    02/23/2022    2:15 AM  Edinburgh Postnatal Depression Scale Screening Tool  I have been able to laugh and see the funny side of things. 0  I have looked forward with enjoyment to things. 0  I have blamed myself unnecessarily when things went wrong. 1  I have been anxious or worried for no good reason. 1  I have felt scared or panicky for no good reason. 0  Things have been getting on top of me. 0  I have been so unhappy that I have had difficulty sleeping. 0  I have felt sad or miserable. 0  I have been so unhappy that I have been crying. 0  The thought of harming myself has occurred to me. 0  Edinburgh Postnatal Depression Scale Total 2     After visit meds:  Allergies  as of 02/11/2023       Reactions   Lactose Intolerance (gi)    cramping        Medication List     STOP taking these medications    Blood Pressure Kit Devi       TAKE these medications    ibuprofen 600 MG tablet Commonly known as: ADVIL Take 1 tablet (600 mg total) by mouth every 6 (six) hours as needed.   PRENATAL PO Take by mouth.         Discharge home in stable condition Infant Feeding: Bottle Infant Disposition:home with mother Discharge instruction: per After Visit Summary and Postpartum booklet. Activity: Advance as tolerated. Pelvic rest for 6 weeks.  Diet: routine diet Future Appointments:No future appointments.  Follow up Visit: [name], CNM  P [facility]  Pool Please schedule this patient for PP visit in: 4-6wks High risk pregnancy complicated by: GDM Delivery mode:  SVD Anticipated Birth Control:  plans inpatient BTL before d/c PP Procedures needed: 2 hour GTT Schedule Integrated BH visit: no Provider: Any provider  02/11/2023 Arabella Merles, CNM 10:06 AM

## 2023-02-10 ENCOUNTER — Encounter (HOSPITAL_COMMUNITY)
Admission: RE | Disposition: A | Discharge status: Home / Self Care | Payer: Self-pay | Source: Home / Self Care | Attending: Obstetrics and Gynecology

## 2023-02-10 ENCOUNTER — Inpatient Hospital Stay (HOSPITAL_COMMUNITY): Payer: Medicaid Other

## 2023-02-10 DIAGNOSIS — R0600 Dyspnea, unspecified: Secondary | ICD-10-CM | POA: Diagnosis not present

## 2023-02-10 LAB — CREATININE, SERUM
Creatinine, Ser: 0.7 mg/dL (ref 0.44–1.00)
GFR, Estimated: >60 mL/min (ref >60)

## 2023-02-10 LAB — CBC
HCT: 27.6 % — ABNORMAL LOW (ref 36.0–46.0)
HCT: 29 % — ABNORMAL LOW (ref 36.0–46.0)
Hemoglobin: 8.7 g/dL — ABNORMAL LOW (ref 12.0–15.0)
Hemoglobin: 9.3 g/dL — ABNORMAL LOW (ref 12.0–15.0)
MCH: 27.4 pg (ref 26.0–34.0)
MCH: 27.5 pg (ref 26.0–34.0)
MCHC: 31.5 g/dL (ref 30.0–36.0)
MCHC: 32.1 g/dL (ref 30.0–36.0)
MCV: 86.8 fL (ref 80.0–100.0)
MCV: 85.8 fL (ref 80.0–100.0)
Platelets: 190 10*3/uL (ref 150–400)
Platelets: 194 10*3/uL (ref 150–400)
RBC: 3.18 MIL/uL — ABNORMAL LOW (ref 3.87–5.11)
RBC: 3.38 MIL/uL — ABNORMAL LOW (ref 3.87–5.11)
RDW: 14.6 % (ref 11.5–15.5)
RDW: 14.7 % (ref 11.5–15.5)
WBC: 13.3 10*3/uL — ABNORMAL HIGH (ref 4.0–10.5)
WBC: 10.7 10*3/uL — ABNORMAL HIGH (ref 4.0–10.5)
nRBC: 0 % (ref 0.0–0.2)
nRBC: 0 % (ref 0.0–0.2)

## 2023-02-10 LAB — BRAIN NATRIURETIC PEPTIDE: B Natriuretic Peptide: 38.8 pg/mL (ref 0.0–100.0)

## 2023-02-10 LAB — TROPONIN I (HIGH SENSITIVITY): Troponin I (High Sensitivity): 7 ng/L (ref <18)

## 2023-02-10 SURGERY — LIGATION, FALLOPIAN TUBE, POSTPARTUM
Anesthesia: Choice | Laterality: Bilateral

## 2023-02-10 MED ORDER — IBUPROFEN 600 MG PO TABS
600.0000 mg | ORAL_TABLET | Freq: Four times a day (QID) | ORAL | Status: DC
  Administered 2023-02-10 – 2023-02-11 (×5): 600 mg via ORAL
  Filled 2023-02-10 (×5): qty 1

## 2023-02-10 MED ORDER — SODIUM CHLORIDE 0.9 % IV SOLN: 250.0000 mL | INTRAVENOUS | Status: DC | PRN

## 2023-02-10 MED ORDER — ALBUTEROL SULFATE (2.5 MG/3ML) 0.083% IN NEBU: 2.5000 mg | INHALATION_SOLUTION | Freq: Once | RESPIRATORY_TRACT | Status: DC | PRN

## 2023-02-10 MED ORDER — SODIUM CHLORIDE 0.9 % IV BOLUS: 500.0000 mL | Freq: Once | INTRAVENOUS | Status: DC | PRN

## 2023-02-10 MED ORDER — BISACODYL 10 MG RE SUPP: 10.0000 mg | Freq: Every day | RECTAL | Status: DC | PRN

## 2023-02-10 MED ORDER — SODIUM CHLORIDE 0.9 % IV SOLN: INTRAVENOUS | Status: DC | PRN

## 2023-02-10 MED ORDER — ONDANSETRON HCL 4 MG PO TABS: 4.0000 mg | ORAL_TABLET | ORAL | Status: DC | PRN

## 2023-02-10 MED ORDER — SODIUM CHLORIDE 0.9% FLUSH
3.0000 mL | Freq: Two times a day (BID) | INTRAVENOUS | Status: DC
  Administered 2023-02-10: 3 mL via INTRAVENOUS

## 2023-02-10 MED ORDER — COCONUT OIL OIL: 1.0000 | TOPICAL_OIL | Status: DC | PRN

## 2023-02-10 MED ORDER — METOCLOPRAMIDE HCL 10 MG PO TABS: 10.0000 mg | ORAL_TABLET | Freq: Once | ORAL | Status: DC

## 2023-02-10 MED ORDER — MEDROXYPROGESTERONE ACETATE 150 MG/ML IM SUSP
150.0000 mg | Freq: Once | INTRAMUSCULAR | Status: AC
  Administered 2023-02-11: 150 mg via INTRAMUSCULAR
  Filled 2023-02-10: qty 1

## 2023-02-10 MED ORDER — DIPHENHYDRAMINE HCL 50 MG/ML IJ SOLN: 25.0000 mg | Freq: Once | INTRAMUSCULAR | Status: DC | PRN

## 2023-02-10 MED ORDER — FLEET ENEMA 7-19 GM/118ML RE ENEM: 1.0000 | ENEMA | Freq: Every day | RECTAL | Status: DC | PRN

## 2023-02-10 MED ORDER — METHYLPREDNISOLONE SODIUM SUCC 125 MG IJ SOLR: 125.0000 mg | Freq: Once | INTRAMUSCULAR | Status: DC | PRN

## 2023-02-10 MED ORDER — SODIUM CHLORIDE 0.9 % IV SOLN: 300.0000 mg | INTRAVENOUS | Status: DC

## 2023-02-10 MED ORDER — TETANUS-DIPHTH-ACELL PERTUSSIS 5-2.5-18.5 LF-MCG/0.5 IM SUSY: 0.5000 mL | PREFILLED_SYRINGE | Freq: Once | INTRAMUSCULAR | Status: DC

## 2023-02-10 MED ORDER — ZOLPIDEM TARTRATE 5 MG PO TABS: 5.0000 mg | ORAL_TABLET | Freq: Every evening | ORAL | Status: DC | PRN

## 2023-02-10 MED ORDER — FAMOTIDINE 20 MG PO TABS: 40.0000 mg | ORAL_TABLET | Freq: Once | ORAL | Status: DC

## 2023-02-10 MED ORDER — SIMETHICONE 80 MG PO CHEW: 80.0000 mg | CHEWABLE_TABLET | ORAL | Status: DC | PRN

## 2023-02-10 MED ORDER — PRENATAL MULTIVITAMIN CH
1.0000 | ORAL_TABLET | Freq: Every day | ORAL | Status: DC
  Administered 2023-02-10 – 2023-02-11 (×2): 1 via ORAL
  Filled 2023-02-10 (×3): qty 1

## 2023-02-10 MED ORDER — DIBUCAINE (PERIANAL) 1 % EX OINT: 1.0000 | TOPICAL_OINTMENT | CUTANEOUS | Status: DC | PRN

## 2023-02-10 MED ORDER — LACTATED RINGERS IV SOLN: INTRAVENOUS | Status: DC

## 2023-02-10 MED ORDER — SODIUM CHLORIDE 0.9% FLUSH: 3.0000 mL | INTRAVENOUS | Status: DC | PRN

## 2023-02-10 MED ORDER — BENZOCAINE-MENTHOL 20-0.5 % EX AERO: 1.0000 | INHALATION_SPRAY | CUTANEOUS | Status: DC | PRN

## 2023-02-10 MED ORDER — FERROUS SULFATE 325 (65 FE) MG PO TABS
325.0000 mg | ORAL_TABLET | ORAL | Status: DC
  Administered 2023-02-10: 325 mg via ORAL
  Filled 2023-02-10: qty 1

## 2023-02-10 MED ORDER — MEASLES, MUMPS & RUBELLA VAC IJ SOLR: 0.5000 mL | Freq: Once | INTRAMUSCULAR | Status: DC

## 2023-02-10 MED ORDER — IOHEXOL 350 MG/ML SOLN
75.0000 mL | Freq: Once | INTRAVENOUS | Status: AC | PRN
  Administered 2023-02-10: 75 mL via INTRAVENOUS

## 2023-02-10 MED ORDER — ACETAMINOPHEN 325 MG PO TABS
650.0000 mg | ORAL_TABLET | ORAL | Status: DC | PRN
  Filled 2023-02-10: qty 2

## 2023-02-10 MED ORDER — SENNOSIDES-DOCUSATE SODIUM 8.6-50 MG PO TABS
2.0000 | ORAL_TABLET | Freq: Every day | ORAL | Status: DC
  Administered 2023-02-10 – 2023-02-11 (×2): 2 via ORAL
  Filled 2023-02-10 (×3): qty 2

## 2023-02-10 MED ORDER — ONDANSETRON HCL 4 MG/2ML IJ SOLN: 4.0000 mg | INTRAMUSCULAR | Status: DC | PRN

## 2023-02-10 MED ORDER — WITCH HAZEL-GLYCERIN EX PADS: 1.0000 | MEDICATED_PAD | CUTANEOUS | Status: DC | PRN

## 2023-02-10 MED ORDER — EPINEPHRINE PF 1 MG/ML IJ SOLN: 0.3000 mg | Freq: Once | INTRAMUSCULAR | Status: DC | PRN

## 2023-02-10 MED ORDER — DIPHENHYDRAMINE HCL 25 MG PO CAPS: 25.0000 mg | ORAL_CAPSULE | Freq: Four times a day (QID) | ORAL | Status: DC | PRN

## 2023-02-10 NOTE — Anesthesia Postprocedure Evaluation (Signed)
Anesthesia Post Note  Patient: Deborah Mahoney  Procedure(s) Performed: AN AD HOC LABOR EPIDURAL     Patient location during evaluation: Mother Baby Anesthesia Type: Epidural Level of consciousness: awake and alert and oriented Pain management: satisfactory to patient Vital Signs Assessment: post-procedure vital signs reviewed and stable Respiratory status: respiratory function stable Cardiovascular status: stable Postop Assessment: no headache, no backache, epidural receding, patient able to bend at knees, no signs of nausea or vomiting, adequate PO intake and able to ambulate Anesthetic complications: no   No notable events documented.  Last Vitals:  Vitals:   02/10/23 0127 02/10/23 0526  BP: 118/77 102/67  Pulse: 77 66  Resp: 16 17  Temp: 37.2 C 37.1 C  SpO2:      Last Pain:  Vitals:   02/10/23 0127  TempSrc: Oral  PainSc:    Pain Goal:                   Oliver Neuwirth

## 2023-02-10 NOTE — Progress Notes (Addendum)
POSTPARTUM PROGRESS NOTE  Post Partum Day 1 Subjective:  Deborah Mahoney is a 32 y.o. G3P3003 [redacted]w[redacted]d s/p nsvd.  No acute events overnight.  Pt denies problems with ambulating, voiding or po intake.  She denies nausea or vomiting.  Pain is well controlled.  She has had flatus. She has not had bowel movement.  Lochia Small.   She does report this morning dyspnea at rest, worse with ambulation. No chest pain or cough or fever. No leg swelling.  Objective: Blood pressure 102/67, pulse 66, temperature 98.7 F (37.1 C), resp. rate 17, height 5\' 5"  (1.651 m), weight 105.3 kg, last menstrual period 05/17/2021, SpO2 100%, unknown if currently breastfeeding.  Physical Exam:  General: alert, cooperative and no distress Lochia:normal flow Chest: CTAB Heart: RRR no m/r/g Abdomen: +BS, soft, nontender,  Uterine Fundus: firm DVT Evaluation: No calf swelling or tenderness Extremities: no edema  Recent Labs    02/09/23 0830 02/10/23 0334  HGB 9.1* 8.7*  HCT 28.7* 27.6*    Assessment/Plan:  ASSESSMENT: Deborah Mahoney is a 32 y.o. G3P3003 [redacted]w[redacted]d s/p nsvd. Overall doing well but with new dyspnea this morning. No hypoxia. She is anemic, hgb 9.7 2 months ago, 9.1 on admission, and 8.7 this morning with an EBL of 52 ml, current bleeding minimal. No signs dvt. No history asthma or chest tightness or wheeze to suggest asthma. While anemia could explain this patient's symptoms, I think we also need to entertain the possibility of a PE and/or heart failure. Think unlikely pneumonia given absence of fever, cough. We checked a repeat cbc shows improvement in hgb to 9.3 which is actually an improvement from her presenting hemoglobin. Troponin and bnp both wnl. CTA to evaluate for PE negative for PE, no other abnormality noted such as signs of infection of fluid overload. By the afternoon patient reports her dyspnea is improving, we will thus continue to monitor. Will start oral iron.  Patient desires BTL. She says  her husband has an appointment for a vasectomy and she wants something that will be effective until then (appointment is in 2 months). I advised using another form of birth control in the interim (such as depo which we can give prior to d/c) to avoid the risks of surgery. She discussed with her husband and has decided to cancel the tubal. She would like depo prior to discharge, I will order that.      LOS: 1 day   Silvano Bilis 02/10/2023, 9:22 AM

## 2023-02-11 ENCOUNTER — Encounter: Payer: Medicaid Other | Admitting: Family Medicine

## 2023-02-11 MED ORDER — IBUPROFEN 600 MG PO TABS: 600.0000 mg | ORAL_TABLET | Freq: Four times a day (QID) | ORAL | 0 refills | Status: AC | PRN

## 2023-02-11 NOTE — Plan of Care (Signed)
Problem: Education: Goal: Knowledge of General Education information will improve Description: Including pain rating scale, medication(s)/side effects and non-pharmacologic comfort measures Outcome: Adequate for Discharge   Problem: Health Behavior/Discharge Planning: Goal: Ability to manage health-related needs will improve Outcome: Adequate for Discharge   Problem: Clinical Measurements: Goal: Ability to maintain clinical measurements within normal limits will improve Outcome: Adequate for Discharge Goal: Will remain free from infection Outcome: Adequate for Discharge Goal: Diagnostic test results will improve Outcome: Adequate for Discharge Goal: Respiratory complications will improve Outcome: Adequate for Discharge Goal: Cardiovascular complication will be avoided Outcome: Adequate for Discharge   Problem: Activity: Goal: Risk for activity intolerance will decrease Outcome: Adequate for Discharge   Problem: Nutrition: Goal: Adequate nutrition will be maintained Outcome: Adequate for Discharge   Problem: Coping: Goal: Level of anxiety will decrease Outcome: Adequate for Discharge   Problem: Elimination: Goal: Will not experience complications related to bowel motility Outcome: Adequate for Discharge Goal: Will not experience complications related to urinary retention Outcome: Adequate for Discharge   Problem: Pain Managment: Goal: General experience of comfort will improve Outcome: Adequate for Discharge   Problem: Safety: Goal: Ability to remain free from injury will improve Outcome: Adequate for Discharge   Problem: Skin Integrity: Goal: Risk for impaired skin integrity will decrease Outcome: Adequate for Discharge   Problem: Education: Goal: Knowledge of Childbirth will improve Outcome: Adequate for Discharge Goal: Ability to make informed decisions regarding treatment and plan of care will improve Outcome: Adequate for Discharge Goal: Ability to state  and carry out methods to decrease the pain will improve Outcome: Adequate for Discharge Goal: Individualized Educational Video(s) Outcome: Adequate for Discharge   Problem: Coping: Goal: Ability to verbalize concerns and feelings about labor and delivery will improve Outcome: Adequate for Discharge   Problem: Life Cycle: Goal: Ability to make normal progression through stages of labor will improve Outcome: Adequate for Discharge Goal: Ability to effectively push during vaginal delivery will improve Outcome: Adequate for Discharge   Problem: Role Relationship: Goal: Will demonstrate positive interactions with the child Outcome: Adequate for Discharge   Problem: Safety: Goal: Risk of complications during labor and delivery will decrease Outcome: Adequate for Discharge   Problem: Pain Management: Goal: Relief or control of pain from uterine contractions will improve Outcome: Adequate for Discharge   Problem: Education: Goal: Ability to describe self-care measures that may prevent or decrease complications (Diabetes Survival Skills Education) will improve Outcome: Adequate for Discharge Goal: Individualized Educational Video(s) Outcome: Adequate for Discharge   Problem: Coping: Goal: Ability to adjust to condition or change in health will improve Outcome: Adequate for Discharge   Problem: Fluid Volume: Goal: Ability to maintain a balanced intake and output will improve Outcome: Adequate for Discharge   Problem: Health Behavior/Discharge Planning: Goal: Ability to identify and utilize available resources and services will improve Outcome: Adequate for Discharge Goal: Ability to manage health-related needs will improve Outcome: Adequate for Discharge   Problem: Metabolic: Goal: Ability to maintain appropriate glucose levels will improve Outcome: Adequate for Discharge   Problem: Nutritional: Goal: Maintenance of adequate nutrition will improve Outcome: Adequate for  Discharge Goal: Progress toward achieving an optimal weight will improve Outcome: Adequate for Discharge   Problem: Skin Integrity: Goal: Risk for impaired skin integrity will decrease Outcome: Adequate for Discharge   Problem: Tissue Perfusion: Goal: Adequacy of tissue perfusion will improve Outcome: Adequate for Discharge   Problem: Education: Goal: Knowledge of condition will improve Outcome: Adequate for Discharge Goal: Individualized Educational Video(s)  Outcome: Adequate for Discharge Goal: Individualized Newborn Educational Video(s) Outcome: Adequate for Discharge   Problem: Activity: Goal: Will verbalize the importance of balancing activity with adequate rest periods Outcome: Adequate for Discharge Goal: Ability to tolerate increased activity will improve Outcome: Adequate for Discharge   Problem: Coping: Goal: Ability to identify and utilize available resources and services will improve Outcome: Adequate for Discharge   Problem: Life Cycle: Goal: Chance of risk for complications during the postpartum period will decrease Outcome: Adequate for Discharge   Problem: Role Relationship: Goal: Ability to demonstrate positive interaction with newborn will improve Outcome: Adequate for Discharge   Problem: Skin Integrity: Goal: Demonstration of wound healing without infection will improve Outcome: Adequate for Discharge

## 2023-02-12 ENCOUNTER — Encounter: Payer: Self-pay | Admitting: General Practice

## 2023-03-09 ENCOUNTER — Telehealth (HOSPITAL_COMMUNITY): Payer: Self-pay | Admitting: *Deleted

## 2023-03-09 NOTE — Telephone Encounter (Signed)
03/09/2023  Name: Deborah Mahoney MRN: [id number] DOB: [birth year]  Reason for Call:  Transition of Care Hospital Discharge Call  Contact Status: Patient Contact Status: Complete  Language assistant needed: Interpreter Mode: Interpreter Not Needed        Follow-Up Questions: Do You Have Any Concerns About Your Health As You Heal From Delivery?: Yes What Concerns Do You Have About Your Health?: Asked when she could resume working out.  Advised to only do gentle walking and stretching until after her postpartum checkup. Do You Have Any Concerns About Your Infants Health?: No  Edinburgh Postnatal Depression Scale:  In the Past 7 Days: I have been able to laugh and see the funny side of things.: As much as I always could I have looked forward with enjoyment to things.: As much as I ever did I have blamed myself unnecessarily when things went wrong.: No, never I have been anxious or worried for no good reason.: No, not at all I have felt scared or panicky for no good reason.: No, not at all Things have been getting on top of me.: No, I have been coping as well as ever I have been so unhappy that I have had difficulty sleeping.: Not at all I have felt sad or miserable.: No, not at all I have been so unhappy that I have been crying.: No, never The thought of harming myself has occurred to me.: Never Edinburgh Postnatal Depression Scale Total: 0  PHQ2-9 Depression Scale:     Discharge Follow-up: Edinburgh score requires follow up?: No Patient was advised of the following resources:: Support Group  Post-discharge interventions: Reviewed Safe Sleep practices for infants  Salena Saner, RN 03/09/2023 10:11

## 2023-03-29 ENCOUNTER — Ambulatory Visit: Payer: Medicaid Other | Admitting: Obstetrics and Gynecology

## 2023-05-21 ENCOUNTER — Ambulatory Visit: Payer: Medicaid Other | Admitting: Student
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
## 2023-06-07 ENCOUNTER — Ambulatory Visit: Payer: Medicaid Other

## 2023-06-09 ENCOUNTER — Ambulatory Visit (INDEPENDENT_AMBULATORY_CARE_PROVIDER_SITE_OTHER): Payer: Medicaid Other | Admitting: Family Medicine

## 2023-06-09 ENCOUNTER — Encounter: Payer: Self-pay | Admitting: Family Medicine

## 2023-06-09 VITALS — BP 120/80 | HR 60 | Wt 210.0 lb

## 2023-06-09 DIAGNOSIS — Z30013 Encounter for initial prescription of injectable contraceptive: Secondary | ICD-10-CM

## 2023-06-09 DIAGNOSIS — O24419 Gestational diabetes mellitus in pregnancy, unspecified control: Secondary | ICD-10-CM

## 2023-06-09 DIAGNOSIS — Z309 Encounter for contraceptive management, unspecified: Secondary | ICD-10-CM | POA: Diagnosis not present

## 2023-06-09 DIAGNOSIS — O24439 Gestational diabetes mellitus in the puerperium, unspecified control: Secondary | ICD-10-CM

## 2023-06-09 LAB — POCT URINE PREGNANCY: Preg Test, Ur: NEGATIVE

## 2023-06-09 MED ORDER — MEDROXYPROGESTERONE ACETATE 150 MG/ML IM SUSP
150.0000 mg | Freq: Once | INTRAMUSCULAR | Status: AC
Start: 2023-06-09 — End: 2023-06-09
  Administered 2023-06-09: 150 mg via INTRAMUSCULAR

## 2023-06-09 NOTE — Assessment & Plan Note (Addendum)
Could not repeat A1c today.  Will reach out and schedule nurse visit to check A1c next week.

## 2023-06-09 NOTE — Patient Instructions (Signed)
It was wonderful to see you today!  Today we did your postpartum/contraception visit.  We went over how you have been doing since the birth of your last child as well as talked about your plans for birth control.  We tested you for pregnancy to make sure it would be safe to start you back on Depo and then a few your shot.  We also made sure that you had the card telling you when you needed to come back for your next shot.  Make sure that you schedule an appointment with our nurse provider in the appropriate window so that you do not have to have a repeat pregnancy test.  We also briefly talked about nutrition.  I recommend adding more fruits and vegetables into your diet rather than trying to restrict so-called bad foods.  Is also important to try and get a small amount of physical activity every day even if it is just a quick walk around the neighborhood with your children.  I recommend scheduling a follow-up appointment with your PCP to discuss this further.  Please call [phone] with any questions about today's appointment.   If you need any additional refills, please call your pharmacy before calling the office.  Gerrit Heck, DO Family Medicine

## 2023-06-09 NOTE — Progress Notes (Signed)
    SUBJECTIVE:   CHIEF COMPLAINT / HPI:   PP follow up and depo? Has not been seen since before she had her child.  Bottle feeding, baby is doing well and is seen regularly No issues with post partum bleeding, has had three periods since birth Negative post partum blues/depression screening Would like to restart depo today  PERTINENT  PMH / PSH: GDM  OBJECTIVE:   BP 120/80   Pulse 60   Wt 210 lb (95.3 kg)   LMP 06/02/2023   SpO2 98%   BMI 34.95 kg/m   General: A&O, NAD Cardiac: RRR, no m/r/g Respiratory: CTAB, normal WOB, no w/c/r GI: Soft, NTTP, non-distended   ASSESSMENT/PLAN:   Gestational diabetes mellitus (GDM), postpartum Could not repeat A1c today.  Will reach out and schedule nurse visit to check A1c next week.  Contraception management Performed pregnancy test in office today as patient missed window for second dose after induction. Plan to administer Depo shot today if pregnancy test is negative, and set her up with future appointments on the nursing schedule for repeat dosing.  Counseled patient on the need for high calcium high vitamin D diet as her risk of osteoporosis is elevated with long-term use of Depo-Provera.   Gerrit Heck, DO [facility]

## 2023-06-09 NOTE — Assessment & Plan Note (Addendum)
Performed pregnancy test in office today as patient missed window for second dose after induction. Plan to administer Depo shot today if pregnancy test is negative, and set her up with future appointments on the nursing schedule for repeat dosing.  Counseled patient on the need for high calcium high vitamin D diet as her risk of osteoporosis is elevated with long-term use of Depo-Provera.

## 2023-06-09 NOTE — Progress Notes (Signed)
Depo given in LD per patient preference without complication.  Next dates 08/25/2023-09/08/2023. Reminder card given.

## 2023-08-30 ENCOUNTER — Ambulatory Visit: Payer: Medicaid Other

## 2023-08-31 ENCOUNTER — Ambulatory Visit (INDEPENDENT_AMBULATORY_CARE_PROVIDER_SITE_OTHER): Payer: Medicaid Other

## 2023-08-31 DIAGNOSIS — Z3042 Encounter for surveillance of injectable contraceptive: Secondary | ICD-10-CM | POA: Diagnosis present

## 2023-08-31 MED ORDER — MEDROXYPROGESTERONE ACETATE 150 MG/ML IM SUSP
150.0000 mg | Freq: Once | INTRAMUSCULAR | Status: AC
Start: 2023-08-31 — End: 2023-08-31
  Administered 2023-08-31: 150 mg via INTRAMUSCULAR

## 2023-08-31 NOTE — Progress Notes (Signed)
Patient here today for Depo Provera injection and is within her dates.    Last contraceptive appt was 06/09/2023.  Depo given in RD today.  Site unremarkable & patient tolerated injection.    Next injection due 11/26/2023-11/30/2023.   Reminder card given.

## 2023-11-23 ENCOUNTER — Ambulatory Visit

## 2023-11-23 DIAGNOSIS — Z3042 Encounter for surveillance of injectable contraceptive: Secondary | ICD-10-CM | POA: Diagnosis present

## 2023-11-23 MED ORDER — MEDROXYPROGESTERONE ACETATE 150 MG/ML IM SUSP
150.0000 mg | Freq: Once | INTRAMUSCULAR | Status: AC
Start: 2023-11-23 — End: 2023-11-23
  Administered 2023-11-23: 150 mg via INTRAMUSCULAR

## 2023-11-23 NOTE — Progress Notes (Signed)
 Patient here today for Depo Provera  injection and is within her dates.    Last contraceptive appt was 06/09/2023  Depo given in RUOQ today.  Site unremarkable & patient tolerated injection.    Next injection due 02/08/24-02/22/24.  Reminder card given.    Elsie Halo, RN

## 2024-02-11 ENCOUNTER — Ambulatory Visit

## 2024-02-11 DIAGNOSIS — Z3042 Encounter for surveillance of injectable contraceptive: Secondary | ICD-10-CM | POA: Diagnosis present

## 2024-02-11 MED ORDER — MEDROXYPROGESTERONE ACETATE 150 MG/ML IM SUSP
150.0000 mg | Freq: Once | INTRAMUSCULAR | Status: AC
Start: 2024-02-11 — End: 2024-02-11
  Administered 2024-02-11: 150 mg via INTRAMUSCULAR

## 2024-02-11 NOTE — Progress Notes (Signed)
 Patient here today for Depo Provera  injection and is within her dates.    Last contraceptive appt was 06/09/2023  Depo given in LUOQ today.  Site unremarkable & patient tolerated injection.    Next injection due 04/28/24-05/12/24.  Reminder card given.    Chiquita JAYSON English, RN

## 2024-04-28 ENCOUNTER — Ambulatory Visit

## 2024-04-28 DIAGNOSIS — Z3042 Encounter for surveillance of injectable contraceptive: Secondary | ICD-10-CM

## 2024-04-28 MED ADMIN — Medroxyprogesterone Acetate IM Susp 150 MG/ML: 150 mg | INTRAMUSCULAR | NDC 00009737611

## 2024-04-28 NOTE — Progress Notes (Signed)
 Patient here today for Depo Provera  injection and is within her dates.     Last contraceptive appt was 06/09/2023.   Depo given in RD, per patient preference, today. Site unremarkable & patient tolerated injection.     Next injection due 07/14/2024-07/28/2024.  Reminder card given.    Patient advised to make next injection with PCP, as depo provera  prescription expires 06/09/2023.  Patient voiced understanding.

## 2024-07-25 ENCOUNTER — Ambulatory Visit: Payer: Self-pay

## 2024-07-27 ENCOUNTER — Encounter: Payer: Self-pay | Admitting: Student

## 2024-07-27 ENCOUNTER — Ambulatory Visit: Admitting: Student

## 2024-07-27 ENCOUNTER — Ambulatory Visit

## 2024-07-27 VITALS — BP 121/77 | HR 77 | Ht 65.0 in | Wt 231.8 lb

## 2024-07-27 DIAGNOSIS — Z309 Encounter for contraceptive management, unspecified: Secondary | ICD-10-CM | POA: Diagnosis present

## 2024-07-27 MED ORDER — MEDROXYPROGESTERONE ACETATE 150 MG/ML IM SUSP
150.0000 mg | Freq: Once | INTRAMUSCULAR | Status: AC
  Administered 2024-07-27: 150 mg via INTRAMUSCULAR

## 2024-07-27 NOTE — Assessment & Plan Note (Signed)
Depo Provera injection today

## 2024-07-27 NOTE — Progress Notes (Signed)
" ° ° °  SUBJECTIVE:   CHIEF COMPLAINT / HPI:   Contraceptive management Adherent with Depo-Provera  injections.  Did not miss dose.  Presents on time for injection.  Denies adverse effects, possible weight gain reports she is to have a she has been.  Review of weight is difficult, as her last weights were in 2024 when she was pregnant.  Discussed importance of vitamin D, and osteoporosis risk.   OBJECTIVE:   BP 121/77   Pulse 77   Ht 5' 5 (1.651 m)   Wt 231 lb 12.8 oz (105.1 kg)   SpO2 97%   BMI 38.57 kg/m    General: NAD, well-appearing, well-nourished Respiratory: No respiratory distress, breathing comfortably, able to speak in full sentences Skin: warm and dry, no rashes noted on exposed skin Psych: Appropriate affect and mood  ASSESSMENT/PLAN:   Assessment & Plan Encounter for contraceptive management, unspecified type -Depo-Provera  injection today   Follow-up recommendations Annual exam scheduled for next week, labs and appropriate cancer screening at that time   Gladis Church, DO [facility] "

## 2024-07-27 NOTE — Progress Notes (Signed)
" ° ° °  SUBJECTIVE:   CHIEF COMPLAINT / HPI:   Contraceptive management Adherent with Depo-Provera  injections.  Did not miss dose.  Presents on time for injection.  Denies adverse effects, possible weight gain reports she is to have a she has been.  Review of weight is difficult, as her last weights were in 2024 when she was pregnant.  Discussed importance of vitamin D, and osteoporosis risk.   OBJECTIVE:   BP 121/77   Pulse 77   Ht 5' 5 (1.651 m)   Wt 231 lb 12.8 oz (105.1 kg)   SpO2 97%   BMI 38.57 kg/m    General: NAD, well-appearing, well-nourished Respiratory: No respiratory distress, breathing comfortably, able to speak in full sentences Skin: warm and dry, no rashes noted on exposed skin Psych: Appropriate affect and mood  ASSESSMENT/PLAN:   Assessment & Plan Encounter for contraceptive management, unspecified type -Depo-Provera  injection today   Follow-up recommendations Annual exam scheduled for next week, labs and appropriate cancer screening at that time   Milda LITTIE Deed, MD [facility] "

## 2024-07-27 NOTE — Patient Instructions (Signed)
 It was great to see you! Thank you for allowing me to participate in your care!   I recommend that you always bring your medications to each appointment as this makes it easy to ensure we are on the correct medications and helps us  not miss when refills are needed.  -Please schedule your annual exam at your earliest convenience - Please ensure you are taking vitamin D, and I recommend getting adequate calcium either through supplementation or through your food   Take care and seek immediate care sooner if you develop any concerns. Please remember to show up 15 minutes before your scheduled appointment time!  Gladis Church, DO [facility]

## 2024-08-04 ENCOUNTER — Encounter: Payer: Self-pay | Admitting: Student
